# Patient Record
Sex: Female | Born: 1955 | Race: Black or African American | Hispanic: No | Smoking: Never smoker
Health system: Southern US, Community
[De-identification: ages and names within clinical notes are randomized; demographics above are authoritative.]

## PROBLEM LIST (undated history)

## (undated) DIAGNOSIS — K5792 Diverticulitis of intestine, part unspecified, without perforation or abscess without bleeding: Secondary | ICD-10-CM

## (undated) DIAGNOSIS — R51 Headache: Secondary | ICD-10-CM

## (undated) DIAGNOSIS — R42 Dizziness and giddiness: Secondary | ICD-10-CM

## (undated) DIAGNOSIS — B009 Herpesviral infection, unspecified: Secondary | ICD-10-CM

## (undated) DIAGNOSIS — F419 Anxiety disorder, unspecified: Secondary | ICD-10-CM

## (undated) DIAGNOSIS — R0600 Dyspnea, unspecified: Secondary | ICD-10-CM

## (undated) DIAGNOSIS — J189 Pneumonia, unspecified organism: Secondary | ICD-10-CM

## (undated) DIAGNOSIS — R519 Headache, unspecified: Secondary | ICD-10-CM

## (undated) DIAGNOSIS — I10 Essential (primary) hypertension: Secondary | ICD-10-CM

## (undated) DIAGNOSIS — G8929 Other chronic pain: Secondary | ICD-10-CM

## (undated) DIAGNOSIS — K219 Gastro-esophageal reflux disease without esophagitis: Secondary | ICD-10-CM

## (undated) DIAGNOSIS — J302 Other seasonal allergic rhinitis: Secondary | ICD-10-CM

## (undated) DIAGNOSIS — M25569 Pain in unspecified knee: Secondary | ICD-10-CM

## (undated) DIAGNOSIS — R7303 Prediabetes: Secondary | ICD-10-CM

## (undated) DIAGNOSIS — M1990 Unspecified osteoarthritis, unspecified site: Secondary | ICD-10-CM

## (undated) HISTORY — PX: ABDOMINAL HYSTERECTOMY: SHX81

## (undated) HISTORY — PX: ABLATION COLPOCLESIS: SHX1118

## (undated) HISTORY — PX: CHOLECYSTECTOMY: SHX55

---

## 2002-01-09 ENCOUNTER — Encounter: Payer: Self-pay | Admitting: Family Medicine

## 2002-01-09 ENCOUNTER — Ambulatory Visit (HOSPITAL_COMMUNITY): Admission: RE | Admit: 2002-01-09 | Discharge: 2002-01-09 | Payer: Self-pay | Admitting: Family Medicine

## 2002-09-04 ENCOUNTER — Emergency Department (HOSPITAL_COMMUNITY): Admission: EM | Admit: 2002-09-04 | Discharge: 2002-09-05 | Payer: Self-pay | Admitting: Emergency Medicine

## 2003-05-05 ENCOUNTER — Emergency Department (HOSPITAL_COMMUNITY): Admission: EM | Admit: 2003-05-05 | Discharge: 2003-05-05 | Payer: Self-pay | Admitting: Emergency Medicine

## 2004-07-02 ENCOUNTER — Inpatient Hospital Stay (HOSPITAL_COMMUNITY): Admission: EM | Admit: 2004-07-02 | Discharge: 2004-07-07 | Payer: Self-pay | Admitting: Emergency Medicine

## 2006-04-15 ENCOUNTER — Inpatient Hospital Stay (HOSPITAL_COMMUNITY): Admission: EM | Admit: 2006-04-15 | Discharge: 2006-04-19 | Payer: Self-pay | Admitting: Emergency Medicine

## 2006-04-18 ENCOUNTER — Ambulatory Visit: Payer: Self-pay | Admitting: Internal Medicine

## 2006-04-26 ENCOUNTER — Inpatient Hospital Stay (HOSPITAL_COMMUNITY): Admission: RE | Admit: 2006-04-26 | Discharge: 2006-04-30 | Payer: Self-pay | Admitting: Obstetrics and Gynecology

## 2006-06-28 ENCOUNTER — Ambulatory Visit (HOSPITAL_COMMUNITY): Admission: RE | Admit: 2006-06-28 | Discharge: 2006-06-28 | Payer: Self-pay | Admitting: Internal Medicine

## 2006-06-28 ENCOUNTER — Ambulatory Visit: Payer: Self-pay | Admitting: Internal Medicine

## 2007-01-20 ENCOUNTER — Emergency Department (HOSPITAL_COMMUNITY): Admission: EM | Admit: 2007-01-20 | Discharge: 2007-01-20 | Payer: Self-pay | Admitting: Emergency Medicine

## 2008-02-02 ENCOUNTER — Other Ambulatory Visit: Admission: RE | Admit: 2008-02-02 | Discharge: 2008-02-02 | Payer: Self-pay | Admitting: Obstetrics and Gynecology

## 2008-02-06 ENCOUNTER — Emergency Department (HOSPITAL_COMMUNITY): Admission: EM | Admit: 2008-02-06 | Discharge: 2008-02-06 | Payer: Self-pay | Admitting: Emergency Medicine

## 2008-04-05 ENCOUNTER — Ambulatory Visit (HOSPITAL_COMMUNITY): Admission: RE | Admit: 2008-04-05 | Discharge: 2008-04-05 | Payer: Self-pay | Admitting: Obstetrics and Gynecology

## 2008-04-12 ENCOUNTER — Ambulatory Visit (HOSPITAL_COMMUNITY): Admission: RE | Admit: 2008-04-12 | Discharge: 2008-04-12 | Payer: Self-pay | Admitting: Obstetrics and Gynecology

## 2008-04-12 ENCOUNTER — Encounter: Payer: Self-pay | Admitting: Obstetrics and Gynecology

## 2008-04-14 ENCOUNTER — Emergency Department (HOSPITAL_COMMUNITY): Admission: EM | Admit: 2008-04-14 | Discharge: 2008-04-15 | Payer: Self-pay | Admitting: Emergency Medicine

## 2008-07-25 ENCOUNTER — Encounter (HOSPITAL_COMMUNITY): Admission: RE | Admit: 2008-07-25 | Discharge: 2008-08-24 | Payer: Self-pay | Admitting: Neurology

## 2008-10-10 ENCOUNTER — Ambulatory Visit (HOSPITAL_COMMUNITY): Admission: RE | Admit: 2008-10-10 | Discharge: 2008-10-10 | Payer: Self-pay | Admitting: Family Medicine

## 2009-05-17 ENCOUNTER — Ambulatory Visit (HOSPITAL_COMMUNITY): Admission: RE | Admit: 2009-05-17 | Discharge: 2009-05-17 | Payer: Self-pay | Admitting: Family Medicine

## 2009-06-14 ENCOUNTER — Ambulatory Visit (HOSPITAL_COMMUNITY): Admission: RE | Admit: 2009-06-14 | Discharge: 2009-06-14 | Payer: Self-pay | Admitting: Family Medicine

## 2009-07-08 ENCOUNTER — Encounter (HOSPITAL_COMMUNITY): Admission: RE | Admit: 2009-07-08 | Discharge: 2009-08-07 | Payer: Self-pay

## 2010-08-13 ENCOUNTER — Ambulatory Visit (HOSPITAL_COMMUNITY): Admission: RE | Admit: 2010-08-13 | Discharge: 2010-08-13 | Payer: Self-pay | Admitting: Family Medicine

## 2010-08-14 ENCOUNTER — Observation Stay (HOSPITAL_COMMUNITY): Admission: EM | Admit: 2010-08-14 | Discharge: 2010-08-15 | Payer: Self-pay | Admitting: Emergency Medicine

## 2011-01-10 ENCOUNTER — Encounter: Payer: Self-pay | Admitting: Family Medicine

## 2011-01-11 ENCOUNTER — Encounter: Payer: Self-pay | Admitting: Family Medicine

## 2011-03-05 LAB — DIFFERENTIAL
Basophils Absolute: 0 10*3/uL (ref 0.0–0.1)
Basophils Relative: 1 % (ref 0–1)
Eosinophils Relative: 5 % (ref 0–5)
Monocytes Relative: 6 % (ref 3–12)
Neutrophils Relative %: 70 % (ref 43–77)

## 2011-03-05 LAB — COMPREHENSIVE METABOLIC PANEL
AST: 27 U/L (ref 0–37)
BUN: 10 mg/dL (ref 6–23)
CO2: 28 mEq/L (ref 19–32)
Calcium: 9.6 mg/dL (ref 8.4–10.5)
Chloride: 104 mEq/L (ref 96–112)
GFR calc non Af Amer: >60 mL/min (ref >60)
Glucose, Bld: 156 mg/dL — ABNORMAL HIGH (ref 70–99)
Sodium: 137 mEq/L (ref 135–145)
Total Bilirubin: 0.4 mg/dL (ref 0.3–1.2)
Total Protein: 6.8 g/dL (ref 6.0–8.3)

## 2011-03-05 LAB — CBC
Hemoglobin: 11.9 g/dL — ABNORMAL LOW (ref 12.0–15.0)
MCHC: 33 g/dL (ref 30.0–36.0)
Platelets: 276 10*3/uL (ref 150–400)
RBC: 4.35 MIL/uL (ref 3.87–5.11)
RDW: 15.1 % (ref 11.5–15.5)

## 2011-03-31 LAB — CREATININE, SERUM
Creatinine, Ser: 0.93 mg/dL (ref 0.4–1.2)
GFR calc non Af Amer: >60 mL/min (ref >60)

## 2011-04-20 ENCOUNTER — Other Ambulatory Visit (HOSPITAL_COMMUNITY): Payer: Self-pay | Admitting: Orthopaedic Surgery

## 2011-04-20 DIAGNOSIS — M7660 Achilles tendinitis, unspecified leg: Secondary | ICD-10-CM

## 2011-04-21 ENCOUNTER — Ambulatory Visit (HOSPITAL_COMMUNITY)
Admission: RE | Admit: 2011-04-21 | Discharge: 2011-04-21 | Disposition: A | Discharge status: Home / Self Care | Payer: PRIVATE HEALTH INSURANCE | Source: Ambulatory Visit | Attending: Orthopaedic Surgery | Admitting: Orthopaedic Surgery

## 2011-04-21 DIAGNOSIS — M25579 Pain in unspecified ankle and joints of unspecified foot: Secondary | ICD-10-CM | POA: Insufficient documentation

## 2011-04-21 DIAGNOSIS — M7660 Achilles tendinitis, unspecified leg: Secondary | ICD-10-CM | POA: Insufficient documentation

## 2011-04-23 ENCOUNTER — Other Ambulatory Visit (HOSPITAL_COMMUNITY): Payer: Self-pay

## 2011-05-05 NOTE — H&P (Signed)
[name], [name]               ACCOUNT NO.:  [id number]   MEDICAL RECORD NO.:  [id number]          PATIENT TYPE:  AMB   LOCATION:  DAY                           FACILITY:  [facility]   PHYSICIAN:  Tilda Burrow, M.D. DATE OF BIRTH:  12/09/1956   DATE OF ADMISSION:  DATE OF DISCHARGE:  LH                              HISTORY & PHYSICAL   ADMITTING DIAGNOSIS:  Perimenopausal bleeding.   HISTORY OF PRESENT ILLNESS:  This is a 56 year old female who was  admitted for hysteroscopy, D and C, and probable endometrial ablation  for evaluation and treatment of postmenopausal bleeding.  She has had an  endometrial biopsy performed earlier in this year for the same bleeding.  On March 01, 2008, she was found to have degenerating secretory-type  endometrium.  She was given Megace to try to control the bleeding.  She  has had no success at resolution of bleeding.  She was split through the  Megace and almost stayed since then.  Repeat ultrasound shows a  persistent endometrial thickening, raising the question of endometrial  polyp.  The endometrial cavity is irregular in contour and the lines  between endometrium and myometrium are not distinct.  The patient is  frustrated with the persistent bleeding and is highly nervous and  anxious about the continued need for monitoring.  We will, therefore,  proceed to attempt to resolve the bleeding by hysteroscopy and D and C  to confirm the presence of endometrial polyps, and if present they will  be removed.  If they are not polyps, a consideration would be given to  endometrial ablation in an effort to resolve the perimenopausal  bleeding.  The patient understands the procedure's risk including  bleeding, infection, or injury to adjacent organs.  The patient is a  dramatically anxious personality and is reluctant to proceed with  medical procedures, but she recognizes the need for resolution of this  problem.   PAST MEDICAL HISTORY:  Positive for  hypertension.   PAST SURGICAL HISTORY:  Negative.   HABITS:  Cigarettes, alcohol, or recreational drugs denied.   ALLERGIES:  Allergies consist of SULFA.   MEDICATIONS:  1. Hydrochlorothiazide 25 mg daily.  2. Maxzide 75/50.  3. Provera 10 mg daily, tried earlier this month.  4. Megace 40 mg daily, tried last month.  None have been successful.   PHYSICAL EXAMINATION:  GENERAL:  A highly anxious, expressive [place]  American female who seems to understand what was going on, but is by  nature very dramatic and anxious.  She understands the procedure.  She  understands additional options of continued observation or additional  medical therapies exists, and she prefers to proceed with a more  aggressive approach.  VITAL SIGNS:  Weight 204 and blood pressure 118/78.  HEENT:  Pupils are equal, round, and reactive.  Extraocular movements  within normal limits.  NECK:  Supple.  CHEST:  Clear to auscultation.  ABDOMEN:  Obese.  GENITALIA:  External genitalia normal.  Vaginal exam shows light blood  present.  Cervix is nonpurulent and nontender.  Uterus is anteflexed  with  ultrasound suggesting a 2.4-cm thickened endometrium.   IMPRESSION:  Persistent perimenopausal bleeding, suspected endometrial  polyp.   PLAN:  Hysteroscopy, D and C, and probable endometrial ablation on April 05, 2008.      Tilda Burrow, M.D.  Electronically Signed     JVF/MEDQ  D:  04/03/2008  T:  04/04/2008  Job:  [id number]

## 2011-05-05 NOTE — Op Note (Signed)
NAMEEPIPHANY, SELTZER               ACCOUNT NO.:  [id number]   MEDICAL RECORD NO.:  [id number]          PATIENT TYPE:  AMB   LOCATION:  DAY                           FACILITY:  [facility]   PHYSICIAN:  Tilda Burrow, M.D. DATE OF BIRTH:  1956-03-23   DATE OF PROCEDURE:  04/12/2008  DATE OF DISCHARGE:                               OPERATIVE REPORT   </PREOPERATIVE DIAGNOSIS/ : Postmenopausal Bleeding, suspected  endometrial polyp   POSTOPERATIVE DIAGNOSIS:  Postmenopausal Bleedingn, suspected  endometrial polyp, .   PROCEDURES:  Hysteroscopy, D&C, endometrial ablation with removal of a  large endometrial polyp.   SURGEON:  Tilda Burrow, M.D.   ASSISTANT:  None.   ANESTHESIA:  General.   COMPLICATIONS:  None.   FINDINGS:  Large 4-cm x 2-cm elongated polypoid endometrial tissue  extracted, small amounts of shaggy endometrium and a small stock of the  endometrial polyp, treated with endometrial ablation.  Surgeon,  Emelda Fear, has passed the specimen to the lab.   DETAILS OF PROCEDURE:  The patient was taken to the operating room and  prepped and draped for a vaginal procedure with timeout conducted per  protocol.  Cervix was grasped with a single-tooth tenaculum after  placement of a Graves speculum.  Cervix was sounded to 9 cm, dilated to  25 [place] allowing introduction of a 30-degree rigid hysteroscope with  visualization and photo documentation of the large endometrial polyp.  A  stock could be identified on the anterior fundal portion of the uterine  contour, and the laparoscopic scissors were used to transect the  majority of the polypoid stock.  The upper aspects of the stock could  not be easily accessed.  The hysteroscope was removed.  Randall stone  forceps were then used to grasp the polyp and rotate it, and then  extracted in fragments of the large polypoid structure.  Hysteroscopy  was performed, identified that there was some additional tissue, which  could then  be grasped with the hysteroscopic grasping device and then  extracted with a hysteroscope.  Again, the hysteroscopy confirmed that  the uterine cavity was satisfactorily emptied with a little bit of the  stock still present.  Smooth and sharp curettage was performed, and then  we proceeded to endometrial ablation to further destroy the polyp stock.  Standard Gynecare ThermaChoice III ablation technique was used, using a  total of 17 mL of D5W for the inflation of the endometrial cavity, and  all 17 mL were recollected at the completion of the procedure.  Hysteroscopy confirmed satisfactory thermal changes in the endometrial  cavity.  Paracervical block with Marcaine 15 mL of 0.5% solution with  1:200000 epinephrine was injected into the paracervical area, and the  patient allowed to awake and go to the recovery room in stable  condition.      Tilda Burrow, M.D.  Electronically Signed     JVF/MEDQ  D:  04/12/2008  T:  04/13/2008  Job:  [id number]   cc:   Mila Homer. Sudie Bailey, M.D.  Fax: [phone]

## 2011-05-05 NOTE — H&P (Signed)
[name], DELAUDER               ACCOUNT NO.:  [id number]   MEDICAL RECORD NO.:  [id number]          PATIENT TYPE:  AMB   LOCATION:  DAY                           FACILITY:  [facility]   PHYSICIAN:  Tilda Burrow, M.D. DATE OF BIRTH:  01/22/1956   DATE OF ADMISSION:  DATE OF DISCHARGE:  LH                              HISTORY & PHYSICAL   ADMITTING DIAGNOSES:  1. Postmenopausal bleeding.  2. Status post vertigo with negative workup February 2008.   HISTORY OF PRESENT ILLNESS:  This 55 year old female patient of Dr.  Gareth Morgan has been seen in our office periodically since February  2009, when she was referred to our office for complaints of  postmenopausal bleeding with anemia and mildly elevated blood pressures.  She had an endometrial biopsy performed through our office, which is  showing degenerating secretory-type endometrium even though she has no  recorded history of progesterone therapy.  Ultrasound prior to biopsy  showed thickened endometrial tissue, 3.6 x 2.2 cm in diameter.  This  raised a question of endometrial polyp.  The patient since then has  continued to bleed intermittently, mild in nature, and at this point is  being taken to the operating room for hysteroscopy and D&C to try to  eliminate the bleeding by removal of endometrial polyp.  The patient was  offered the same procedure in March but declined it.  She was attempted  on Megace 40 mg b.i.d., which did not control the spotting.   Medical history is notable for episodes of dizziness and unsteady gait,  noted by Dr. Sudie Bailey.  The dizziness was sufficient enough that earlier  this year in January the patient was stopped by police on the street due  to her being suspected for intoxication based on her unsteady gait.  She  has no history of seizures and no known workup for CNS lesions.  A CT  scan of the head was performed, which was  negative.  Fortunately the  symptoms of dizziness have seemingly  resolved.   LABORATORY EVALUATION:  Laboratory evaluation today includes thyroid  function tests normal.  TSH 1.91.  Hemoglobin 11.5, hematocrit 36.9,  serum iron 53, B12 level 571.  Folic acid greater than 20 mg/mL.  Electrolytes were within normal limits with potassium 3.9, BUN 14,  creatinine 1.07.   PHYSICAL EXAMINATION:  GENERAL:  Exam shows a cheerful anxiety-prone  African American female.  VITAL SIGNS:  Weight 204, blood pressure 118/78.  Height estimated at 5  feet, 5 inches.  HEENT:  Pupils equal, round, and reactive.  CARDIOVASCULAR:  Unremarkable.  ABDOMEN:  Obesity present without masses.  EXTERNAL GENITALIA:  Normal for age.  Vaginal exam shows light blood  present.  The uterus is anteflexed, seems within normal limit size with  ultrasound identification of a thickened endometrium earlier this year.  Adnexa without masses or tenderness.   IMPRESSION:  Postmenopausal bleeding, suspected endometrial polyp.   PLAN:  Hysteroscopy and D&C April 05, 2008.   The procedure has been reviewed with the patient.  Polypectomy is  planned if discovered.  In the event  that there is a diffuse thickening  the tissue D&C will be performed and followed with endometrial ablation.  The patient understands the procedure.  The options and the plan of  action with the usual risks of surgery, including bleeding, injury to  adjacent organs, uterine perforation, etc., were reviewed with the  patient.      Tilda Burrow, M.D.  Electronically Signed     JVF/MEDQ  D:  04/04/2008  T:  04/04/2008  Job:  [id number]   cc:   Mila Homer. Sudie Bailey, M.D.  Fax: [phone]

## 2011-05-08 NOTE — Consult Note (Signed)
[name], [name]               ACCOUNT NO.:  [id number]   MEDICAL RECORD NO.:  [id number]          PATIENT TYPE:  INP   LOCATION:  A303                          FACILITY:  [facility]   PHYSICIAN:  Lionel December, M.D.    DATE OF BIRTH:  1956-12-19   DATE OF CONSULTATION:  04/26/2006  DATE OF DISCHARGE:                                   CONSULTATION   REASON FOR CONSULTATION:  Lower abdominal pain in a patient with history of  diverticulitis.   HISTORY OF PRESENT ILLNESS:  [name] is a 55 year old African-American  female who was treated at this facility between April 15, 2006, and April 19, 2006.  She presented with lower abdominal pain.  Abdominopelvic CT  suggested diverticulitis.  She was treated with IV Flagyl and Cipro.  She  was seen in consultation by Dr Jena Gauss.  She improved with therapy.  The plan  was for her to come back to [place] at the end of this month for a colonoscopy.  The patient states that she was feeling a lot better when she home when she  went home, although she was not pain free.  She states she has been  compliant with her Cipro and Flagyl.  Last evening she noted sharp pain  across the lower abdomen.  She did not experience fever but had chills.  She  had an appointment with Dr. Sudie Bailey this morning.  She therefore decided to  wait.  However, she woke up around 2:30 a.m. this morning with throbbing  pain which she has had ever since.  She states most of the pain is in the  left lower quadrant although pain is experienced across the lower abdomen.  She denies nausea or vomiting.  She has had intermittent diarrhea although  she did not have a BM yesterday.  This morning she had mushy stool with  fresh blood in it.  It was a small amount.  She does not have good appetite.  However, she states all of her weight loss is voluntary.  She states she has  lost 18 pounds this year.  The patient states she has had more or less  constant pain for the last 2 weeks.  However,  she has had off and on pain  for 2, maybe 3 months.  She was seen by Dr. Sudie Bailey.  She had lab studies  and abdominopelvic CT and hospitalized for further management.   At home she is on:  1.  Hydrochlorothiazide 25 mg daily.  2.  Potassium chloride 120 mEq daily.  3.  Albuterol inhaler two puffs q.i.d. p.r.n.  4.  Cipro 500 mg b.i.d.  5.  Flagyl 5 mg p.o. t.i.d..   PAST MEDICAL HISTORY:  She has been hypertensive since she was 55 years old.  Asthma was diagnosed in 1996 and she has not had any problems.  She had left  pyelonephritis with E. coli urosepsis for which she was hospitalized in July  2005.  She had tubal ligation about 23 years ago and open cholecystectomy  about 20 or 21 years ago.   Recent hospitalization  as above.   ALLERGIES:  To DILAUDID which caused itching and possibly angioedema.  She  was also allergic to SULFA which caused rash and blisters.   FAMILY HISTORY:  Noncontributory.  She had eight brothers and three sisters.  Three brothers are diseased.  Two died at young age and one at age 15 years.  One sister died at young age as well.  Rest of the siblings are in good  health.   SOCIAL HISTORY:  She is married.  She has two children.  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.  She does not smoke  cigarettes or drink alcohol.   PHYSICAL EXAMINATION:  GENERAL:  Pleasant, moderately-obese African-American  female who is in some distress.  VITAL SIGNS:  Admission weight 201 pounds.  She is 5 feet tall.  Pulse 95  per minute, blood pressure 130/76, temperature is 98.4, and respirations 16.  HEENT:  Conjunctivae are pink.  Sclerae are nonicteric.  Oropharyngeal  mucosa is normal.  No neck masses or thyromegaly noted.  CARDIAC:  With  regular rhythm., normal S1 and S2.  No murmur or gallop noted.  LUNGS:  Clear to auscultation.  ABDOMEN:  Full, bowel sounds are  normal.  Palpation reveals soft abdomen  with moderate tenderness at LLQ in hypogastric area.  Some guarding noted in  these areas but no rebound.  She has mild tenderness at LUQ and right lower  quadrant.  No organomegaly or masses noted.  RECTAL:  Examination deferred.  EXTREMITIES:  She does not have peripheral edema or clubbing.   LABORATORY:  These were done prior to admission by [facility]:  Wbc's 13.3;  H&H is 12.5 and 37.7; platelet count is 403,000.  She had 75 segs, 0 bands.  Sed rate is 28.  Sodium 137, potassium 4.1, chloride 103, CO2 is 20, glucose  110, BUN 12, creatinine 1.3, bili is 0.5, alkaline phosphatase 107, AST 23,  ALT 21, total protein 7.2, with albumin of 4.1, calcium is 10.1.   Abdominopelvic CT reviewed with Dr. Tyron Russell in comparison made with the study  of April 15, 2006.  She has thickening to sigmoid colon mucosa with  diverticula and pericolonic stranding but there is no free air or fluid  collection.  There is a small localized area right pelvic cavity which is  possibly an ovary rather than a small amount of fluid collection.  This is  unchanged since previous study.   ASSESSMENT:  [name] is a 55 year old African-American female who presents  with worsening lower abdominal pain mainly centered in hypogastric and left  lower quadrant area.  She was recently hospitalized for what was felt to be  diverticulitis and improved with antibiotic therapy.  Her symptoms have  progressed while on antibiotic therapy, which is worrisome.  She also  experienced hematochezia this morning.  I still believe we are dealing with  diverticulitis with poor response to therapy.  However, she could have  sigmoid colon neoplasm present like this or sigmoid colon neoplasm which can  present like this, or she could also C. difficile colitis even though she is  on metronidazole.  Patients with C. difficile can fail or not respond to metronidazole.  It is possible that hematochezia is  secondary to hemorrhoids  or even diverticulitis.  Since there is no evidence of free air or abscess,  I feel IV antibiotic therapy  is warranted for the next couple of days.  If  she does not improve significantly in the next 48 hours, surgical  consultation would be warranted.  If she shows improvement, we will hold off  doing a colonoscopy and/or sigmoidoscopy.  She would definitely need a  follow-up CT prior to discharge to document that the process has not been  progressive.  Furthermore, she will need antibiotic therapy for at least 4  weeks.  If her acute illness responds to medical or conservative therapy,  she will still need to come back for a colonoscopy at a later date.   RECOMMENDATIONS:  Agree with treating her with Levaquin or IV metronidazole  but I have taken the liberty of changing the dose of metronidazole to 500 mg  IV q.6h.  She will be given morphine IV for pain control.  She will have  stool for wbc's and C. difficile toxin titer.   She will have CBC in a.m.  We will allow her clear liquids for the time-  being.   We would like to thank Dr. Sudie Bailey for the opportunity to participate in  the care of this nice lady.      Lionel December, M.D.  Electronically Signed     NR/MEDQ  D:  04/26/2006  T:  04/27/2006  Job:  [id number]

## 2011-05-08 NOTE — H&P (Signed)
NAMEAMAIYA, [name]               ACCOUNT NO.:  [id number]   MEDICAL RECORD NO.:  [id number]          PATIENT TYPE:  INP   LOCATION:  A339                          FACILITY:  [facility]   PHYSICIAN:  Angus G. Renard Matter, MD   DATE OF BIRTH:  1956/11/05   DATE OF ADMISSION:  04/15/2006  DATE OF DISCHARGE:  LH                                HISTORY & PHYSICAL   This 55 year old female was admitted through the ED.  She presented there  with   DICTATION CANCELLED      Angus G. Renard Matter, MD  Electronically Signed     AGM/MEDQ  D:  04/15/2006  T:  04/15/2006  Job:  [id number]

## 2011-05-08 NOTE — H&P (Signed)
NAMEAKILAH, CURETON               ACCOUNT NO.:  [id number]   MEDICAL RECORD NO.:  [id number]          PATIENT TYPE:  INP   LOCATION:  A339                          FACILITY:  [facility]   PHYSICIAN:  Mila Homer. Sudie Bailey, M.D.DATE OF BIRTH:  1956/05/27   DATE OF ADMISSION:  04/15/2006  DATE OF DISCHARGE:  LH                                HISTORY & PHYSICAL   55 year old developed left lower quadrant abdominal pain last week.  It  persisted and yesterday she had nausea and vomiting with this, no diarrhea.  Bowel movements were normal.  No urinary symptoms.   She came up to the [facility] Department for evaluation due to the  pain.   She has been on HCTZ 25 mg per day for hypertension.   REVIEW OF SYSTEMS:  Other than this was unremarkable.  Occasional fever and  chills.   PHYSICAL EXAMINATION:  GENERAL:  Pleasant middle-aged woman who is now in a  hospital bed.  She is oriented and alert, no acute distress.  Somewhat  obese.  VITAL SIGNS:  Blood pressure initially 120/75, pulse 97, respiratory rate  20, temperature 98.7.  LYMPH:  She had no axillary, supraclavicular, anterior cervical nodes.  HEENT:  Mucous membranes were moist.  Speech was normal.  HEART:  Regular rhythm, rate about 70.  LUNGS:  Clear throughout, moving air well.  ABDOMEN:  Soft without hepatosplenomegaly or mass but there was definite  tenderness in the left lower quadrant on palpation.  There was no rebound  noted.  Bowel sounds appeared to be normal.  EXTREMITIES:  There was no edema of the ankles.   CT scan of the abdomen did show a sigmoid diverticulitis with some  inflammatory nodes.  There was atherosclerosis of the aorta.   Blood tests included admission CBC:  White cell count 10,300 of which 72%  neutrophils, 17 lymphs.  A CMP showed a glucose 113, potassium 3.3, lipase  38.  The UA showed a specific gravity 1015, pH 7 with moderate leukocytes.  Under the microscope there were many squamous  cells, many bacteria, 11-20  wbc's per HPF.   ADMITTING DIAGNOSES:  1.  Sigmoid diverticulitis.  2.  Essential hypertension.  3.  Hypokalemia.  4.  Obesity.  5.  Atherosclerosis of the aorta.  6.  Hyperglycemia.   She is now admitted to the hospital on Cipro 400 mg IV q.12h. and Flagyl 500  mg IV t.i.d.  She has been given Dilaudid for pain, but had some itching  with this so this is being stopped.  She is on an IV of half normal saline.  discussed with patient and family.      Mila Homer. Sudie Bailey, M.D.  Electronically Signed     SDK/MEDQ  D:  04/15/2006  T:  04/15/2006  Job:  [id number]

## 2011-05-08 NOTE — H&P (Signed)
[name], [name]                         ACCOUNT NO.:  [id number]   MEDICAL RECORD NO.:  [id number]                   PATIENT TYPE:  INP   LOCATION:  A327                                 FACILITY:  [facility]   PHYSICIAN:  Hanley Hays. Dechurch, M.D.           DATE OF BIRTH:  07/26/1956   DATE OF ADMISSION:  07/02/2004  DATE OF DISCHARGE:                                HISTORY & PHYSICAL   HISTORY OF PRESENT ILLNESS:  This 55 year old African American female  followed by Dr. Sudie Bailey with a history of hypertension, usually well  controlled, presented to the emergency room with a two day history of  headache, chills, myalgias, fevers and right flank pain.  Her workup in the  emergency room included various labs, CT of head and abdomen and a lumbar  puncture.  Significant findings included changes on the CT scan consistent  with a right pyelonephritis, though, no evidence of abscess of obstruction.  She had a leukocytosis and a fever of 103.  Initial blood pressure was  115/68 which fell to a low of 85/52.  She was also tachycardic at the time  of presentation.  She states she has had headaches for years for which she  takes [place] powders or BC powders on a regular basis.  She has never had any  previous evaluation, nor has she had any mental status changes, focal  findings or changes in her headaches.  She did note some nervousness which  actually she described as shaking all over which sounds more like rigors.  She is not anything for anxiety.  She does not use any street drugs.  She  has had no change in her stools.  No nausea, vomiting or diarrhea, though  she had some nausea this morning.  She has had no hematuria, hematochezia.  She has had no chest pain, though, she does note some right upper quadrant  pain and some pleuritic-type symptoms.  She notes some pain with cough.  She  states she is short of breath now and has noted it over the last 24 hours.  She has no history of blood  clots.  The patient is admitted to the hospital  for further evaluation and treatment.   PAST MEDICAL HISTORY:  1. Hypertension.  2. No surgeries.  3. Gravida 1, para 1, abortus 0.  4. Uncomplicated pregnancy.  5. Status post cholecystectomy.   SOCIAL HISTORY:  She is married, monogamous.  No alcohol or tobacco abuse.  She works for [employer] and [employer] and has for several years.   FAMILY HISTORY:  She has multiple brothers and sisters with no significant  family history.  Mother died related to heart failure.  Father is still  living.   REVIEW OF SYSTEMS:  As per HPI.   PHYSICAL EXAMINATION:  VITAL SIGNS:  Lightly obese female.  Blood pressure  currently 99.49, pulse 120, temperature 101.2.  GENERAL APPEARANCE:  She  is alert and appropriate.  Able to provide adequate  history.  She has no focal neurological findings.  HEENT:  No icterus.  Oropharynx is moist.  Teeth are fair repair.  NECK:  Supple.  No JVD, adenopathy, thyromegaly or bruits.  LUNGS:  Clear to auscultation though diminished at the bases.  No rales.  HEART:  Regular, slightly tachycardic.  No murmur.  ABDOMEN:  Protuberant, soft, diffusely tender in the right upper quadrant.  EXTREMITIES:  Without cyanosis, clubbing or edema.  SKIN:  Without rash, lesion or breakdown.  NEUROLOGICAL:  Completely intact.   LABORATORY DATA:  D. dimer 2.2.  Urinalysis reveals white cells and  incidental findings of trichomoniasis.  Bilirubin initially 2.2, now 1.8.  Albumin 2.5, MCV 74, hemoglobin 9.9 post hydration, potassium now 3.2 from  2.6.  At time of admission, BUN 13, creatinine 1.4.   ASSESSMENT/PLAN:  1. Febrile illness, most likely on the basis of right pyelonephritis.  2. Elevated D. dimer, though her pain may be related to the pyelonephritis,     cannot rule out pleuritis.  Certainly, given her history, it would be     reasonable to follow up CT to rule out PE with continued hydration in     order not to  assault the kidney any more with dye.  3. Incidental findings of trichomoniasis.  No previous history.  Will go     ahead and treat with Flagyl.  Her partner will need 2 g single dose     therapy at discharge.  4. Anemia with microcytosis, probably on the basis of chronic iron     deficiency in this patient with ongoing menses.  Will  heme check stools.     Further workup as an outpatient.  Will do the hematologic evaluation     here.  5. Increase bilirubin with normal LFT's.  No previous history with normal     appearing liver without obstruction.  Suspect __________.  Will monitor.  6. Hypertension, stable.  Hold the hydrochlorothiazide until sepsis is     improved and will need K supplement as an outpatient.  7. Decreased potassium.  Continue IV p.o. repletion.     ___________________________________________                                         Hanley Hays Josefine Class, M.D.   FED/MEDQ  D:  07/03/2004  T:  07/03/2004  Job:  [id number]

## 2011-05-08 NOTE — H&P (Signed)
NAMESHAKIMA, NISLEY               ACCOUNT NO.:  [id number]   MEDICAL RECORD NO.:  [id number]          PATIENT TYPE:  INP   LOCATION:  A303                          FACILITY:  [facility]   PHYSICIAN:  Mila Homer. Sudie Bailey, M.D.DATE OF BIRTH:  1956-11-13   DATE OF ADMISSION:  04/26/2006  DATE OF DISCHARGE:  LH                                HISTORY & PHYSICAL   HISTORY OF PRESENT ILLNESS:  This 55 year old woman came to the office today  with increasing abdominal pain and chills and as of 2 o'clock this morning,  some bloody diarrhea.   She recently had been admitted to the hospital with a presumptive diagnosis  of diverticulitis and treated with Cipro 400 mg b.i.d. and Metronidazole 500  mg t.i.d., both IV.  She had an allergic reaction to Dilaudid which included  some angioedema of the neck and throat but she recovered from this with one  dose of IV steroids and she was discharged home about a week ago on Cipro  500 mg b.i.d. and Metronidazole 500 mg t.i.d.  Other diagnoses of benign  essential hypertension and obesity.   PHYSICAL EXAMINATION:  VITAL SIGNS:  In the office today she weighed 203  pounds, height 60 inches, the volume mass index was 40, the blood pressure  140/86, the pulse 104.  GENERAL:  She was oriented, alert, and distressed over the abdominal pain.  SKIN:  Skin turgor looked good.  HEENT:  Mucous membranes were moist.  She gave a good history.  HEART:  Had a regular rhythm and rate of 120.  LUNGS:  Clear throughout.  Moving air well.  ABDOMEN:  Soft, but she has severe tenderness with rebound in the left upper  quadrant and severe abdominal pain and palpation, also with rebound in the  right lower quadrant.  Bowel sounds were present but quiet.  EXTREMITIES:  There is no edema of the ankles.   LABORATORY DATA:  Her white cell count was 13,300, with 75% granulocytes, 16  lymphs.  Her H&H was 12.5/37.7, platelet count of 403,000.  Sedimentation  rate was 28 (normal  0-22) and a met 77, glucose of 110, BUN 12, creatinine  1.3.   Her abdominal CT scan with contrast still showed what appeared to be  diverticulitis involving the sigmoid region.  This really had not changed  markedly from her prior abdominal CT scan.   ADMISSION DIAGNOSES:  1.  Diverticulitis.  2.  Possible Clostridium difficile colitis.  3.  Hyperglycemia.  4.  Obesity.  5.  Benign essential hypertension.   I discussed her case with Dr. Lionel December, gastroenterologist.  Stool for  routine cultures and antigen for C. difficile was pending.  Meanwhile, I am  putting her back on Metronidazole 500 mg IV q.8 h., but switching her to  Levaquin 500 mg IV q.24 h. instead of the Cipro.  We will see how she does  in the next couple days and get surgery referral if necessary.      Mila Homer. Sudie Bailey, M.D.  Electronically Signed     SDK/MEDQ  D:  04/26/2006  T:  04/26/2006  Job:  [id number]

## 2011-05-08 NOTE — Discharge Summary (Signed)
[name], [name]               ACCOUNT NO.:  [id number]   MEDICAL RECORD NO.:  [id number]          PATIENT TYPE:  INP   LOCATION:  A303                          FACILITY:  [facility]   PHYSICIAN:  Mila Homer. Sudie Bailey, M.D.DATE OF BIRTH:  1956-08-03   DATE OF ADMISSION:  04/26/2006  DATE OF DISCHARGE:  05/11/2007LH                                 DISCHARGE SUMMARY   HISTORY OF PRESENT ILLNESS:  This 55 year old woman was readmitted to the  hospital with persistent diverticulitis.  She had been on Cipro 500 mg  b.i.d. and metronidazole 500 mg t.i.d. at home.  She had a benign 5-day  hospital course extending from Apr 26, 2006, to Apr 30, 2006, and her vital  signs remained stable.  Her admission white cell count from the office was  13,000 with a shift to the left.  Sedimentation rate was 38.  Recheck white  cell count on second day in hospital was 8700 with 17% neutrophils and 16  lymphs.  UA was negative.  Fecal white cells and C. difficile were negative.   HOSPITAL COURSE:  She was readmitted to the hospital and started on Levaquin  500 mg IV q.24h. and metronidazole 500 mg IV q.8h.  Dr. Karilyn Cota and Dr.  Jena Gauss, gastroenterology, were called in on consult.  Her metronidazole was  increased to 5 mg IV q.6h. and she was given morphine sulfate 4 mg IV q.6h.  for severe pain.  Ambien 10 mg nightly for sleep.  Unfortunately, she had  swelling of the neck again and this was felt this was probably secondary to  morphine, so she was taken off the morphine and switched to Vicodin 5/500  every 4 hours and given a couple of doses of Solu-Medrol 40 mg at 12-hour  interval.   On hospital day #4, she was switched to Flagyl 500 mg t.i.d. and Avelox 400  mg daily.  IV Flagyl and Levaquin were discontinued.   On hospital day #5, she still had some minimal abdominal pain.  She was up  to a low-residue diet.  It was felt that she was stable for discharge home.   DISCHARGE MEDICATIONS:  1.  Avelox 400 mg  daily (15 with no refills).  2.  Metronidazole 500 mg t.i.d. (45 with no refills).   FOLLOW UP:  She is to see me in the office within the week after discharge  and will have a follow up with Dr. Karilyn Cota and Dr. Jena Gauss including  colonoscopy once her infection cooled down within the next 6-8 weeks.   DISCHARGE DIAGNOSES:  1.  Probable diverticulitis.  2.  Angioedema secondary to intravenous narcotics.  3.  Benign essential hypertension.  4.  Hyperglycemia.  5.  Obesity.      Mila Homer. Sudie Bailey, M.D.  Electronically Signed     SDK/MEDQ  D:  04/30/2006  T:  04/30/2006  Job:  [id number]

## 2011-05-08 NOTE — Group Therapy Note (Signed)
[name], [name]               ACCOUNT NO.:  [id number]   MEDICAL RECORD NO.:  [id number]          PATIENT TYPE:  INP   LOCATION:  A339                          FACILITY:  [facility]   PHYSICIAN:  Mila Homer. Sudie Bailey, M.D.DATE OF BIRTH:  Jan 23, 1956   DATE OF PROCEDURE:  04/17/2006  DATE OF DISCHARGE:                                   PROGRESS NOTE   SUBJECTIVE:  She is still in pain on the left side. The neck feels better.   OBJECTIVE:  Temperature is 97.3, pulse 73, respiratory rate 20, blood  pressure 93/60. She is sitting in a chair, looking out the window.  She  appears to be oriented, alert. She is well developed and obese. She is in no  acute distress. Heart has a regular rhythm with a rate of 70. Lungs are  clear throughout. Abdomen is soft and obese without hepatosplenomegaly or  masses, but she does have some tenderness to deep palpation in the left  lower quadrant. She still has some swelling noted in the anterior neck  bilaterally, but definitely better than it was yesterday. Today, the white  cell count is 11,600 with 91 neutrophils and 6 lymphocytes. MET-7 shows a  glucose of 171.   ASSESSMENT:  1.  Diverticulitis of the sigmoid colon.  2.  Allergic reaction with some angioedema of the neck.  3.  Hyperglycemia.   PLAN:  Discussed with Dr. Jena Gauss of GI. Will continue with IV fluids and  antibiotics. Will try to hold with one dose of Solu-Medrol since we do not  want to compromise the immune system with the diverticulitis.      Mila Homer. Sudie Bailey, M.D.  Electronically Signed     SDK/MEDQ  D:  04/17/2006  T:  04/18/2006  Job:  [id number]

## 2011-05-08 NOTE — Group Therapy Note (Signed)
[name], [name]               ACCOUNT NO.:  [id number]   MEDICAL RECORD NO.:  [id number]          PATIENT TYPE:  INP   LOCATION:  A303                          FACILITY:  [facility]   PHYSICIAN:  Mila Homer. Sudie Bailey, M.D.DATE OF BIRTH:  1956/01/06   DATE OF PROCEDURE:  04/27/2006  DATE OF DISCHARGE:                                   PROGRESS NOTE   SUBJECTIVE:  The patient is a generally feeling better today than she did  yesterday.  It is noted she had neck thickening again.   OBJECTIVE:  VITAL SIGNS:  Temperature is 98.8, pulse 92, respiratory rate 16  and the blood pressure is 110/71.  GENERAL APPEARANCE:  The patient is supine in bed.  She is oriented, alert  and in no acute distress.  The patient is well-developed and obese.  She  weighs 202 pounds and she is 60 inches in height.  HEART:  The heart has a regular rhythm with a rate of 70 on my exam.  LUNGS:  The lungs clear and moving air well.  ABDOMEN:  The abdomen is soft without hepatosplenomegaly or mass.  There is  minimal tenderness; some in the left upper quadrant and some in the right  lower quadrant.  It definitely improved from yesterday.  EXTREMITIES:  There is edema the ankles.  NECK: I can feel two areas of thickening in the anterior neck.   ASSESSMENT:  Diverticulitis.   PLAN:  1.  Continue with the Levaquin 500 mg IV daily.  2.  Metronidazole 500 mg IV q.8 hours.   I discussed the patient's case at length with Dr. Dionicia Abler, gastroenterologist,  yesterday.      Mila Homer. Sudie Bailey, M.D.  Electronically Signed     SDK/MEDQ  D:  04/27/2006  T:  04/28/2006  Job:  [id number]

## 2011-05-08 NOTE — Op Note (Signed)
NAMEREIZEL, [name]               ACCOUNT NO.:  [id number]   MEDICAL RECORD NO.:  [id number]          PATIENT TYPE:  AMB   LOCATION:  DAY                           FACILITY:  [facility]   PHYSICIAN:  Lionel December, M.D.    DATE OF BIRTH:  January 20, 1956   DATE OF PROCEDURE:  06/28/2006  DATE OF DISCHARGE:                                 OPERATIVE REPORT   PROCEDURE:  Colonoscopy.   INDICATIONS:  Delecia is 55 year old Afro-American female who was  hospitalized twice for diverticulitis.  She is finally responded to  antibiotic therapy.  She is undergoing colonoscopy both for diagnostic and  screening purposes.  Presently she is free of abdominal pain.  Procedure and  risk were reviewed with the patient, informed consent was obtained.   MEDS FOR CONSCIOUS SEDATION:  Demerol 50 mg IV, Versed 4 mg IV.   FINDINGS:  Procedure performed in endoscopy suite.  The patient's vital  signs and O2 sat were monitored during procedure and  remained stable.  The  patient was placed in left lateral position.  Rectal examination performed.  No abnormality noted on external or digital exam.  Olympus videoscope was  placed rectum and advanced under vision into sigmoid colon beyond.  Preparation was excellent.  She had scattered diverticula at sigmoid colon.  These were small to moderate in size.  No peristomal inflammation was noted.  These are limited mainly to the distal sigmoid colon and there was no  peristomal erythema or mucopurulent discharge involving any of these.  Scope  was passed into descending colon and easily into cecum.  Cecum was  identified by appendiceal orifice and ileocecal valve.  Pictures taken for  the record.  As the scope was withdrawn colonic mucosa was examined for the  second time.  There was a 6 to 7 mm submucosal lipoma at transverse colon  which was left alone.  Mucosa rest of the colon was normal.  Rectal mucosa  similarly was normal.  Scope was retroflexed to examine anorectal  junction  and small hemorrhoids noted below the dentate line.  Endoscope was  straightened withdrawn.  The patient tolerated the procedure well.   Sigmoid colon diverticulosis (moderate).  Incidental finding of small  submucosal lipoma and the transverse colon.  Small external hemorrhoids.   RECOMMENDATIONS:  High-fiber diet plus fiber supplement 4 grams daily.   Yearly Hemoccults and she may consider next exam in 10 years from now.      Lionel December, M.D.  Electronically Signed     NR/MEDQ  D:  06/28/2006  T:  06/28/2006  Job:  [id number]   cc:   Mila Homer. Sudie Bailey, M.D.  Fax: [phone]

## 2011-05-08 NOTE — Group Therapy Note (Signed)
NAMESHERLEEN, PANGBORN               ACCOUNT NO.:  [id number]   MEDICAL RECORD NO.:  [id number]          PATIENT TYPE:  INP   LOCATION:  A339                          FACILITY:  [facility]   PHYSICIAN:  Mila Homer. Sudie Bailey, M.D.DATE OF BIRTH:  09-07-1956   DATE OF PROCEDURE:  04/16/2006  DATE OF DISCHARGE:                                   PROGRESS NOTE   SUBJECTIVE:  The patient developed some tenderness in the anterior neck last  night and persisted today. Yesterday, she had had some itching after getting  Dilaudid and we switched her IV morphine at that point. She is still having  some abdominal tenderness as well.   OBJECTIVE:  Temperature 99.2, pulse 80, respiratory rate 20, blood pressure  97/55. Her lungs are clear throughout, moving air well. Heart has a regular  rate and rhythm, rate of about 70 today. The abdomen is soft and obese  without demonstrable hepatosplenomegaly or masses. She does have some  diffuse tenderness more in the left lower quadrant than in the right lower  quadrant, but there is as well. There is no edema of the ankles. The  anterior neck shows swelling and tenderness bilaterally, seems to be diffuse  and does not involve lymph nodes as such.   Today, her white cell count is 8200 with 68% neutrophils, 18 lymphs, H&H  11.7/34.9.  Potassium 3.3, glucose 103, creatinine 103. Her O2 saturation is  98% on room air.   MR of the neck showed a diffuse edema involving the submandibular region,  strap muscles in the lingual region.   ASSESSMENT:  1.  Diverticulitis.  2.  Probable allergic reaction to drug (early angioedema).  3.  Hypokalemia.  4.  Hyperglycemia.   PLAN:  Solu-Medrol 125 mg IV one time dose. Continue with IV Cipro 400 mg  q.12h., Flagyl 5 mg IV t.i.d. Recheck on exam in the morning. Of note, I  personally talked to radiologist about these findings on the phone.      Mila Homer. Sudie Bailey, M.D.  Electronically Signed     SDK/MEDQ  D:   04/16/2006  T:  04/17/2006  Job:  [id number]

## 2011-05-08 NOTE — Discharge Summary (Signed)
[name], [name]                         ACCOUNT NO.:  [id number]   MEDICAL RECORD NO.:  [id number]                   PATIENT TYPE:  INP   LOCATION:  A327                                 FACILITY:  [facility]   PHYSICIAN:  Hanley Hays. Dechurch, M.D.           DATE OF BIRTH:  07-May-1956   DATE OF ADMISSION:  07/02/2004  DATE OF DISCHARGE:  07/07/2004                                 DISCHARGE SUMMARY   DIAGNOSES:  1. E. coli sepsis secondary to pyelonephritis.  2. Asymptomatic trichomoniasis, incidental finding.  3. History of asthma with exacerbation secondary to volume overload.  4. Hypokalemia, resolved.  5. Hypertension.  6. Anemia with iron deficiency.  7. History of chronic headaches.   DISPOSITION:  The patient is discharged to home.   MEDICATIONS:  1. Levaquin 500 mg daily to complete a 14-day course.  2. Dyazide one daily.   FOLLOWUP:  1. With Dr. Ninetta Lights D. F[place] prior to return to work.  2. The patient will need followup BMP and anemia evaluation/GI workup.   HOSPITAL COURSE:  The patient is a 55 year old African-American female who  was in her usual state of health until three days prior to admission when  she noted fatigue, headache, and chills.  She progressively got weaker  because of nausea, and generally was not feeling well.  She presented to the  emergency room where she was noted to be febrile.  She was complaining of  right flank pain.  A CT of the abdomen revealed findings consistent with  pyelonephritis.  Other pertinent findings in her urinalysis revealed  trichomoniasis.  She also was anemic with an initial hemoglobin of 10.5  which fell to a low of 8.7 post hydration.  Her MCV was 73.6.  Total iron  saturation was 7% with a total iron of 19 and TIBC of 273, ferritin of 101.  B12 and folate were unremarkable.  TSH was normal at 1.4.  Initial  creatinine was 1.6 and 1.2 at the time of discharge.  Initial potassium was  2.6 and normal at the time of  discharge.  She also had a mildly elevated  total bilirubin but normal alkaline phosphatase of 2 with normal  transaminases which decreased with hydration.  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  The patient two days prior to discharge had  increasing shortness of breath and wheezing.  She was treated with  nebulizers and Solu-Medrol.  She had some acute delirium related to the Solu-  Medrol, and pretty significant tremulousness related to the albuterol nebs.  She was changed to Xopenex.  Her steroid was decreased as she really was not  wheezing.  Concern for PE was raised given the fact that the patient had an  elevated D-Dimer at the time of admission, but because of the question of a  contrast reaction, the patient herself refused the CT of the chest.  In  any  event, she underwent a VQ scan which revealed no evidence of PE.  She  received some IV Lasix. Over the course of the next  24 hours, her respiratory status and anxiety much improved.  She  subsequently grew E coli from urine cultures, and was sensitive to all  antibiotics tested.  She was discharged to home in improved condition with  follow up as noted above.     ___________________________________________                                         Hanley Hays. Josefine Class, M.D.   FED/MEDQ  D:  07/07/2004  T:  07/07/2004  Job:  [id number]   cc:   Tesfaye D. Felecia Shelling, M.D.  [address]  [place]  [place] [address]  Fax: [phone]

## 2011-05-08 NOTE — Discharge Summary (Signed)
[name], [name]               ACCOUNT NO.:  [id number]   MEDICAL RECORD NO.:  [id number]          PATIENT TYPE:  INP   LOCATION:  A339                          FACILITY:  [facility]   PHYSICIAN:  Mila Homer. Sudie Bailey, M.D.DATE OF BIRTH:  28-Jan-1956   DATE OF ADMISSION:  04/15/2006  DATE OF DISCHARGE:  04/30/2007LH                                 DISCHARGE SUMMARY   This 55 year old woman was admitted to the hospital with diverticulitis.  She had a benign five-day hospitalization extending from April 15, 2006 to  April 19, 2006.  Vital signs remained stable.   Her admission white cell count was 10,300 of which 73% neutrophils, 17  lymphs.  CMP showed a potassium 3.3, glucose 113, lipase 38.  UA normal  except for moderate leukocytes and microscopic showed 11-20 wbc's per HPF  and many bacteria.   Repeat white cell count second day is 8200 with 68% neutrophils, 18  lymphocytes.  BMP showed potassium 3.3, glucose 103, and the following day  white cell count was 11,600, 91% neutrophils, 6 lymphs, and a BMET showed  potassium now up to 3.9 with a glucose of 151.   Her CT scan of the abdomen and pelvis done on admission showed left lower  quadrant colonic wall thickening, inflammatory change consistent with  diverticulitis.  MR of her orbit showed diffuse inflammation around the  submandibular glands and sublingual glands.   She was admitted to the hospital and started on IV half normal saline KVO.  Put on HCTZ per day, Lovenox per day, Cipro 400 mg IV q.12h., Flagyl 5 mg IV  t.i.d., and Dilaudid 1-2 mg four hours p.r.n. pain.  Dilaudid had to be  stopped later that day because she started itching after getting it.  IV  then switched to half normal saline 100 mL an hour with KCl 20 mEq b.i.d.   Later that night the patient began to get swelling in the neck, became worse  overnight so that her second day there was significant and on palpation  there were actually what felt to be masses  on either side of the anterior  cervical region.  MR showed this just to be diffuse edema, however.  She was  already on morphine sulfate 2 mg IV q.3h. for severe pain at this time.  That was continued but it was felt that the masses probably represent  angioedema from the Dilaudid.  With that patient was given one dose of Solu-  Medrol 125 mg IV, potassium increased to 20 mEq t.i.d.  Then on her fourth  day Dr. Jena Gauss, GI consult, switched to Flagyl 500 mg p.o. t.i.d. and Cipro  500 mg p.o. b.i.d. and started the IV Cipro and Flagyl.   She is much improved by her fifth hospital day, ready for discharge home.   FINAL DISCHARGE DIAGNOSES:  1.  Diverticulitis.  2.  Allergic reaction to Dilaudid with angioedema of the neck.  3.  Hypokalemia probably secondary to thiazide diuretics.  4.  Benign essential hypertension.  5.  Obesity.  6.  Hypoglycemia.  7.  Atherosclerosis of the aorta.  She is discharged home on Cipro 500 mg b.i.d. for seven days (14, no  refills) and Flagyl 500 mg t.i.d. for seven days (#21, no refills).  She is  to continue her HCTZ 25 mg per day at home, but will add KCl 20 mEq daily  with this (30 with 11 refills).  Follow up in the office within a week.  She  will need a colonoscopy within eight weeks to make sure she just has  diverticula and not colon cancer.      Mila Homer. Sudie Bailey, M.D.  Electronically Signed     SDK/MEDQ  D:  04/19/2006  T:  04/19/2006  Job:  [id number]

## 2011-05-08 NOTE — Group Therapy Note (Signed)
NAMELUXE, CUADROS               ACCOUNT NO.:  [id number]   MEDICAL RECORD NO.:  [id number]          PATIENT TYPE:  INP   LOCATION:  A303                          FACILITY:  [facility]   PHYSICIAN:  Mila Homer. Sudie Bailey, M.D.DATE OF BIRTH:  [birth year]   DATE OF PROCEDURE:  04/28/2006  DATE OF DISCHARGE:                                   PROGRESS NOTE   She told me she is really feeling better and abdomen feels better. When Dr.  Karilyn Cota saw her yesterday she still had swelling in her neck so we arranged  for a couple of doses of Solu-Medrol.   Temperature is 97.7, pulse 87, respiratory rate 20, blood pressure 141/79.  She is oriented, alert, no acute distress, well-developed and obese. The  anterior neck shows less swelling than it did 2 days ago. This is barely  discernable on palpation. The abdomen is soft and obese without demonstrable  hepatosplenomegaly or mass and decreased tenderness noted in the left upper  quadrant and right lower quadrant.   Lab work showed  negative C Dif on her stool and no fecal WBCs.   ASSESSMENT:  1.  Diverticulitis.  2.  Allergic reaction to DILAUDID and MORPHINE and presumed to NARCOTICS.   PLAN:  Continue with IV Levaquin and Flagyl. Will not use any more narcotics  for pain control. Discussed with Dr. Karilyn Cota at length yesterday.      Mila Homer. Sudie Bailey, M.D.  Electronically Signed     SDK/MEDQ  D:  04/28/2006  T:  04/29/2006  Job:  [id number]

## 2011-05-08 NOTE — Consult Note (Signed)
[name], [name]               ACCOUNT NO.:  [id number]   MEDICAL RECORD NO.:  [id number]          PATIENT TYPE:  INP   LOCATION:  A339                          FACILITY:  [facility]   PHYSICIAN:  R. Roetta Sessions, M.D. DATE OF BIRTH:  1956/11/24   DATE OF CONSULTATION:  04/15/2006  DATE OF DISCHARGE:                                   CONSULTATION   REASON FOR CONSULTATION:  Possible diverticulitis.   REFERRING PHYSICIAN:  Mila Homer. Sudie Bailey, M.D.   HISTORY OF PRESENT ILLNESS:  The patient is a 55 year old lady who presents  with a one-week history of left lower quadrant abdominal pain associated  with loose stools.  She saw Dr. Sudie Bailey about a week ago when these  symptoms presented. She was given Karren Burly for the diarrhea.  Her abdominal  pain has progressed, but the diarrhea has now resolved.  Denies any bright  red blood per rectum or melena. She has had some nausea and intermittent  vomiting.  She had low-grade fever.  On presentation today, her white count  was 10,300. CT revealed colonic wall thickening at the junction of the  descending and sigmoid colon with stranding adjacent fat and adjacent  prominent lymph nodes suggestive of diverticulitis.  She has never had a  colonoscopy.  Urinalysis revealed moderate leukocyte esterase, many squamous  epithelial cells, many bacteria, 11 to 20 wbc's.  She has been started on IV  Cipro and IV Flagyl.  She is on IV narcotic medications for pain control as  well.   MEDICATIONS AT HOME:  1.  Hydrochlorothiazide 25 mg daily.  2.  Lennox p.r.n.   ALLERGIES:  DILAUDID given during this hospitalization has caused itching.  Also allergic to SULFA which causes rash and blisters.   PAST MEDICAL HISTORY:  1.  Hypertension.  2.  Asthma.  3.  E. coli sepsis secondary to pyelonephritis, hospitalized in July 2005.  4.  Status post cholecystectomy and tubal ligation.   FAMILY HISTORY:  Negative for chronic GI illnesses or colorectal  cancer.   SOCIAL HISTORY:  She is married, has 2 children, works at [employer].  Denies any tobacco or alcohol use.   REVIEW OF SYSTEMS:  See HPI for GI.  CONSTITUTIONAL: No weight loss.  GENITOURINARY: No dysuria.  CARDIOPULMONARY: No chest pain or shortness of  breath.   PHYSICAL EXAMINATION:  VITAL SIGNS: Temperature 100.8, pulse 95,  respirations 20, blood pressure 124/67.  GENERAL:  Pleasant, middle-aged, obese, flat female in no acute distress.  She appears uncomfortable.  SKIN:  Warm and dry, no jaundice.  HEENT:  Conjunctivae pink, sclerae nonicteric.  Oropharyngeal mucosa moist  and pink.  No lesions, erythema, or exudate.  NECK:  No lymphadenopathy or thyromegaly.  CHEST: Clear to auscultation.  CARDIAC: Exam reveals regular rate and rhythm, normal S1 and S2.  No  murmurs, rubs, or gallops.  ABDOMEN:  Positive bowel sounds.  Obese but symmetrical and soft. Moderate  left lower quadrant tenderness to deep palpation with subjective guarding.  No rebound tenderness.  No organomegaly or masses.  EXTREMITIES:  No edema.  LABORATORY DATA:  White count 2300, hemoglobin 12.6, hematocrit 37.8,  platelets 337,000, MCV 83.  Sodium 139, potassium 3.3, BUN 11, creatinine  1.2, glucose 113, total bilirubin 0.5, alkaline phosphatase 97, AST 22, ALT  19, albumin 3.8, lipase 38.   IMPRESSION:  The patient is a 55 year old lady with one-week history of left  lower quadrant abdominal pain and CT findings suggestive of diverticulitis.  Cannot exclude possibility of neoplasia, however.  Once current illness  resolves, would recommend colonoscopy in 6 to 8 weeks.   PLAN:  1.  Continue IV Cipro and IV Flagyl as you are doing.  2.  Continue supportive measures.  3.  Colonoscopy in 6 to 8 weeks.  4.  We will continue to follow the patient with you.   We would like to thank Dr. John Giovanni for allowing [place] to take part in  the care of this patient.      Tana Coast,  P.AJonathon Bellows, M.D.  Electronically Signed    LL/MEDQ  D:  04/15/2006  T:  04/15/2006  Job:  [id number]   cc:   Mila Homer. Sudie Bailey, M.D.  Fax: [phone]

## 2011-05-08 NOTE — Group Therapy Note (Signed)
[name], [name]               ACCOUNT NO.:  [id number]   MEDICAL RECORD NO.:  [id number]          PATIENT TYPE:  INP   LOCATION:  A303                          FACILITY:  [facility]   PHYSICIAN:  Mila Homer. Sudie Bailey, M.D.DATE OF BIRTH:  08-09-1956   DATE OF PROCEDURE:  DATE OF DISCHARGE:  04/30/2006                                   PROGRESS NOTE   She really does feel a good deal better.  She notes no more swelling of the  neck and her abdominal pain is improved.   OBJECTIVE:  VITAL SIGNS:  Temperature 98.8, pulse79, respiratory rate 20,  blood pressure 105/55.  NECK:  The anterior neck appears soft without any edema.  HEART:  Regular rhythm.  Rate of  80.  LUNGS:  Clear.  They are moving air well.  ABDOMEN:  Soft without demonstrable hepatosplenomegaly or mass and there is  minimal tenderness in the left upper quadrant on palpation, right lower  quadrant on palpation.  Bowel sounds are quiet.   ASSESSMENT:  1.  Presumptive diverticulitis.  2.  Allergic reaction to narcotics including Dilaudid and morphine.   PLAN:  1.  Continue hospitalization.  2.  Switch to Flagyl 500 mg p.o. t.i.d. and Avelox 400 mg p.o. every day and      discontinue the IV Levaquin and IV Flagyl.  3.  Continue with Vicodin 5/500 q.4h. p.r.n. pain.      Mila Homer. Sudie Bailey, M.D.  Electronically Signed     SDK/MEDQ  D:  04/29/2006  T:  04/30/2006  Job:  [id number]

## 2011-06-25 ENCOUNTER — Emergency Department (HOSPITAL_COMMUNITY)
Admission: EM | Admit: 2011-06-25 | Discharge: 2011-06-25 | Disposition: A | Discharge status: Home / Self Care | Payer: PRIVATE HEALTH INSURANCE | Attending: Emergency Medicine | Admitting: Emergency Medicine

## 2011-06-25 ENCOUNTER — Encounter (HOSPITAL_COMMUNITY): Payer: Self-pay | Admitting: Radiology

## 2011-06-25 ENCOUNTER — Emergency Department (HOSPITAL_COMMUNITY): Payer: PRIVATE HEALTH INSURANCE

## 2011-06-25 DIAGNOSIS — K859 Acute pancreatitis without necrosis or infection, unspecified: Secondary | ICD-10-CM | POA: Insufficient documentation

## 2011-06-25 DIAGNOSIS — E876 Hypokalemia: Secondary | ICD-10-CM | POA: Insufficient documentation

## 2011-06-25 DIAGNOSIS — J45909 Unspecified asthma, uncomplicated: Secondary | ICD-10-CM | POA: Insufficient documentation

## 2011-06-25 DIAGNOSIS — I10 Essential (primary) hypertension: Secondary | ICD-10-CM | POA: Insufficient documentation

## 2011-06-25 HISTORY — DX: Essential (primary) hypertension: I10

## 2011-06-25 LAB — DIFFERENTIAL
Basophils Relative: 1 % (ref 0–1)
Eosinophils Absolute: 0.5 10*3/uL (ref 0.0–0.7)
Monocytes Absolute: 0.4 10*3/uL (ref 0.1–1.0)
Monocytes Relative: 6 % (ref 3–12)

## 2011-06-25 LAB — CBC
Hemoglobin: 12 g/dL (ref 12.0–15.0)
MCH: 27.1 pg (ref 26.0–34.0)
MCHC: 32.6 g/dL (ref 30.0–36.0)
Platelets: 304 10*3/uL (ref 150–400)

## 2011-06-25 LAB — COMPREHENSIVE METABOLIC PANEL
ALT: 22 U/L (ref 0–35)
Calcium: 10.2 mg/dL (ref 8.4–10.5)
GFR calc Af Amer: >60 mL/min (ref >60)
Glucose, Bld: 115 mg/dL — ABNORMAL HIGH (ref 70–99)
Sodium: 144 mEq/L (ref 135–145)
Total Protein: 7.7 g/dL (ref 6.0–8.3)

## 2011-06-25 LAB — LIPASE, BLOOD: Lipase: 64 U/L — ABNORMAL HIGH (ref 11–59)

## 2011-06-25 LAB — URINALYSIS, ROUTINE W REFLEX MICROSCOPIC
Hgb urine dipstick: NEGATIVE
Leukocytes, UA: NEGATIVE
Nitrite: NEGATIVE
Specific Gravity, Urine: 1.025 (ref 1.005–1.030)
Urobilinogen, UA: 0.2 mg/dL (ref 0.0–1.0)

## 2011-09-11 LAB — DIFFERENTIAL
Lymphocytes Relative: 27
Monocytes Absolute: 0.8
Monocytes Relative: 10
Neutro Abs: 4.4
Neutrophils Relative %: 57

## 2011-09-11 LAB — CBC
Hemoglobin: 11.2 — ABNORMAL LOW
RBC: 4.01
WBC: 7.7

## 2011-09-11 LAB — BASIC METABOLIC PANEL
Calcium: 9.7
GFR calc Af Amer: 28 — ABNORMAL LOW
GFR calc non Af Amer: 23 — ABNORMAL LOW
Sodium: 141

## 2011-09-15 LAB — DIFFERENTIAL
Basophils Absolute: 0
Basophils Relative: 1
Eosinophils Absolute: 0.4
Eosinophils Absolute: 0.5
Eosinophils Relative: 5
Lymphs Abs: 1.7
Lymphs Abs: 2.1
Monocytes Absolute: 0.7
Monocytes Relative: 8
Neutrophils Relative %: 62
Neutrophils Relative %: 62

## 2011-09-15 LAB — BASIC METABOLIC PANEL
CO2: 27
Chloride: 108
Creatinine, Ser: 1.03
GFR calc Af Amer: >60
Potassium: 2.8 — ABNORMAL LOW
Sodium: 140

## 2011-09-15 LAB — COMPREHENSIVE METABOLIC PANEL
ALT: 23
AST: 33
CO2: 26
Calcium: 9.7
Chloride: 104
Creatinine, Ser: 1.23 — ABNORMAL HIGH
GFR calc non Af Amer: 46 — ABNORMAL LOW
Glucose, Bld: 93
Sodium: 140
Total Bilirubin: 0.6

## 2011-09-15 LAB — POCT I-STAT 4, (NA,K, GLUC, HGB,HCT)
Glucose, Bld: 114 — ABNORMAL HIGH
HCT: 36
HCT: 36
Hemoglobin: 12.2
Hemoglobin: 12.2
Operator id: 216531
Potassium: 2.6 — CL
Potassium: 3.4 — ABNORMAL LOW
Sodium: 140
Sodium: 144

## 2011-09-15 LAB — CBC
Hemoglobin: 10.1 — ABNORMAL LOW
Hemoglobin: 10.6 — ABNORMAL LOW
MCHC: 33.7
MCHC: 33.9
MCV: 79.3
MCV: 80.4
RBC: 3.72 — ABNORMAL LOW
RBC: 3.97
WBC: 7.3
WBC: 8.9

## 2012-04-10 ENCOUNTER — Emergency Department (HOSPITAL_COMMUNITY)
Admission: EM | Admit: 2012-04-10 | Discharge: 2012-04-10 | Disposition: A | Discharge status: Home / Self Care | Payer: PRIVATE HEALTH INSURANCE | Attending: Emergency Medicine | Admitting: Emergency Medicine

## 2012-04-10 ENCOUNTER — Encounter (HOSPITAL_COMMUNITY): Payer: Self-pay

## 2012-04-10 ENCOUNTER — Emergency Department (HOSPITAL_COMMUNITY): Payer: PRIVATE HEALTH INSURANCE

## 2012-04-10 DIAGNOSIS — R112 Nausea with vomiting, unspecified: Secondary | ICD-10-CM | POA: Insufficient documentation

## 2012-04-10 DIAGNOSIS — Z79899 Other long term (current) drug therapy: Secondary | ICD-10-CM | POA: Insufficient documentation

## 2012-04-10 DIAGNOSIS — I10 Essential (primary) hypertension: Secondary | ICD-10-CM | POA: Insufficient documentation

## 2012-04-10 DIAGNOSIS — R109 Unspecified abdominal pain: Secondary | ICD-10-CM | POA: Insufficient documentation

## 2012-04-10 DIAGNOSIS — K5732 Diverticulitis of large intestine without perforation or abscess without bleeding: Secondary | ICD-10-CM | POA: Insufficient documentation

## 2012-04-10 DIAGNOSIS — J45909 Unspecified asthma, uncomplicated: Secondary | ICD-10-CM | POA: Insufficient documentation

## 2012-04-10 DIAGNOSIS — R630 Anorexia: Secondary | ICD-10-CM | POA: Insufficient documentation

## 2012-04-10 DIAGNOSIS — K5792 Diverticulitis of intestine, part unspecified, without perforation or abscess without bleeding: Secondary | ICD-10-CM

## 2012-04-10 DIAGNOSIS — R197 Diarrhea, unspecified: Secondary | ICD-10-CM | POA: Insufficient documentation

## 2012-04-10 LAB — CBC
MCV: 82.4 fL (ref 78.0–100.0)
Platelets: 337 10*3/uL (ref 150–400)
RBC: 4.89 MIL/uL (ref 3.87–5.11)
RDW: 14.5 % (ref 11.5–15.5)
WBC: 11.8 10*3/uL — ABNORMAL HIGH (ref 4.0–10.5)

## 2012-04-10 LAB — BASIC METABOLIC PANEL
CO2: 26 mEq/L (ref 19–32)
Calcium: 10.7 mg/dL — ABNORMAL HIGH (ref 8.4–10.5)
GFR calc non Af Amer: 54 mL/min — ABNORMAL LOW (ref >90)
Glucose, Bld: 121 mg/dL — ABNORMAL HIGH (ref 70–99)
Potassium: 3.3 mEq/L — ABNORMAL LOW (ref 3.5–5.1)
Sodium: 139 mEq/L (ref 135–145)

## 2012-04-10 LAB — DIFFERENTIAL
Basophils Absolute: 0 10*3/uL (ref 0.0–0.1)
Eosinophils Relative: 2 % (ref 0–5)
Lymphocytes Relative: 20 % (ref 12–46)
Lymphs Abs: 2.4 10*3/uL (ref 0.7–4.0)
Neutrophils Relative %: 71 % (ref 43–77)

## 2012-04-10 MED ORDER — SODIUM CHLORIDE 0.9 % IV SOLN
Freq: Once | INTRAVENOUS | Status: AC
  Administered 2012-04-10: 10:00:00 via INTRAVENOUS

## 2012-04-10 MED ORDER — ONDANSETRON HCL 4 MG/2ML IJ SOLN
4.0000 mg | Freq: Once | INTRAMUSCULAR | Status: AC
  Administered 2012-04-10: 4 mg via INTRAVENOUS
  Filled 2012-04-10: qty 2

## 2012-04-10 MED ORDER — CIPROFLOXACIN IN D5W 400 MG/200ML IV SOLN: 400.0000 mg | Freq: Once | INTRAVENOUS | Status: DC

## 2012-04-10 MED ORDER — HYDROMORPHONE HCL PF 1 MG/ML IJ SOLN
1.0000 mg | Freq: Once | INTRAMUSCULAR | Status: AC
  Administered 2012-04-10: 1 mg via INTRAVENOUS
  Filled 2012-04-10: qty 1

## 2012-04-10 MED ORDER — HYDROCODONE-ACETAMINOPHEN 5-325 MG PO TABS: 1.0000 | ORAL_TABLET | ORAL | Status: AC | PRN

## 2012-04-10 MED ORDER — ONDANSETRON HCL 4 MG/2ML IJ SOLN: 4.0000 mg | Freq: Once | INTRAMUSCULAR | Status: DC

## 2012-04-10 MED ORDER — METRONIDAZOLE 500 MG PO TABS: 500.0000 mg | ORAL_TABLET | Freq: Two times a day (BID) | ORAL | Status: AC

## 2012-04-10 MED ORDER — CIPROFLOXACIN IN D5W 400 MG/200ML IV SOLN
400.0000 mg | Freq: Once | INTRAVENOUS | Status: AC
Start: 2012-04-10 — End: 2012-04-10
  Administered 2012-04-10: 400 mg via INTRAVENOUS
  Filled 2012-04-10: qty 200

## 2012-04-10 MED ORDER — CIPROFLOXACIN HCL 250 MG PO TABS: 500.0000 mg | ORAL_TABLET | Freq: Two times a day (BID) | ORAL | Status: AC

## 2012-04-10 MED ORDER — METRONIDAZOLE IN NACL 5-0.79 MG/ML-% IV SOLN: 500.0000 mg | Freq: Once | INTRAVENOUS | Status: DC

## 2012-04-10 MED ORDER — PROMETHAZINE HCL 25 MG PO TABS: 12.5000 mg | ORAL_TABLET | Freq: Four times a day (QID) | ORAL | Status: DC | PRN

## 2012-04-10 MED ORDER — METRONIDAZOLE IN NACL 5-0.79 MG/ML-% IV SOLN
500.0000 mg | Freq: Once | INTRAVENOUS | Status: AC
  Administered 2012-04-10: 500 mg via INTRAVENOUS
  Filled 2012-04-10: qty 100

## 2012-04-10 MED ORDER — HYDROMORPHONE HCL PF 1 MG/ML IJ SOLN: 1.0000 mg | Freq: Once | INTRAMUSCULAR | Status: DC

## 2012-04-10 NOTE — ED Provider Notes (Signed)
History   This chart was scribed for [employer]. Colon Branch, MD by Clarita Crane. The patient was seen in room APA01/APA01. Patient's care was started at 0852.    CSN: [id number]  Arrival date & time 04/10/12  4098   First MD Initiated Contact with Patient 04/10/12 [phone]      Chief Complaint  Patient presents with  . Abdominal Pain  .     (Consider location/radiation/quality/duration/timing/severity/associated sxs/prior treatment) HPI EDDA OREA is a 56 y.o. female who presents to the Emergency Department complaining of constant moderate left sided abdominal pain radiating to right side of abdomen described as sharp onset 1 week ago and persistent since with associated nausea and vomiting for the past 6 days, decreased appetite and diarrhea. Notes abdominal pain is worse with palpation and walking. Denies hematuria, blood in stool, increased SOB, chest pain, fever, chills. Patient reports she was evaluated by a physician 1 week ago and was prescribed a 10 day course of Ciprofloxacin without improvement in symptoms since. Patient with h/o cholecystectomy, HTN and asthma but denies h/o kidney stones, appendectomy.  PCP- Sudie Bailey  Past Medical History  Diagnosis Date  . Hypertension   . Asthma     Past Surgical History  Procedure Date  . Abdominal hysterectomy   . Cholecystectomy     No family history on file.  History  Substance Use Topics  . Smoking status: Never Smoker   . Smokeless tobacco: Not on file  . Alcohol Use: No    OB History    Grav Para Term Preterm Abortions TAB SAB Ect Mult Living                  Review of Systems A complete 10 system review of systems was obtained and all systems are negative except as noted in the HPI and PMH.   Allergies  Iohexol and Sulfa antibiotics  Home Medications   Current Outpatient Rx  Name Route Sig Dispense Refill  . CIPROFLOXACIN HCL 500 MG PO TABS Oral Take 500 mg by mouth 2 (two) times daily.    Marland Kitchen  HYDROCHLOROTHIAZIDE 25 MG PO TABS Oral Take 25 mg by mouth daily.      BP 146/73  Pulse 94  Temp(Src) 97.7 F (36.5 C) (Oral)  Resp 22  Ht 4\' 11"  (1.499 m)  Wt 192 lb (87.091 kg)  BMI 38.78 kg/m2  SpO2 97%  Physical Exam  Nursing note and vitals reviewed. Constitutional: She is oriented to person, place, and time. She appears well-developed and well-nourished.       Grimacing. Uncomfortable appearing.   HENT:  Head: Normocephalic and atraumatic.  Eyes: EOM are normal. Pupils are equal, round, and reactive to light.  Neck: Neck supple. No tracheal deviation present.  Cardiovascular: Normal rate and regular rhythm.  Exam reveals no gallop and no friction rub.   No murmur heard. Pulmonary/Chest: Effort normal. No respiratory distress. She has no wheezes. She has no rales.  Abdominal: Soft. Bowel sounds are normal. She exhibits no distension. There is tenderness (diffuse, more pronouced within left side the right though). There is rebound. There is no guarding.  Musculoskeletal: Normal range of motion. She exhibits no edema.  Neurological: She is alert and oriented to person, place, and time. No sensory deficit.  Skin: Skin is warm and dry.  Psychiatric: She has a normal mood and affect. Her behavior is normal.    ED Course  Procedures (including critical care time)  DIAGNOSTIC STUDIES: Oxygen Saturation is  98% on room air, normal by my interpretation.    COORDINATION OF CARE: 9:15AM- Patient informed of current plan for treatment and evaluation and agrees with plan at this time.  1:14PM- Patient informed of diagnosis and prescribed two antibiotics. Patient agreed to manage condition at home and will be discharged after treatment with antibiotics, pain and nausea prescriptions.  Results for orders placed during the hospital encounter of 04/10/12  CBC      Component Value Range   WBC 11.8 (*) 4.0 - 10.5 (K/uL)   RBC 4.89  3.87 - 5.11 (MIL/uL)   Hemoglobin 13.1  12.0 - 15.0  (g/dL)   HCT 16.1  09.6 - 04.5 (%)   MCV 82.4  78.0 - 100.0 (fL)   MCH 26.8  26.0 - 34.0 (pg)   MCHC 32.5  30.0 - 36.0 (g/dL)   RDW 40.9  81.1 - 91.4 (%)   Platelets 337  150 - 400 (K/uL)  DIFFERENTIAL      Component Value Range   Neutrophils Relative 71  43 - 77 (%)   Neutro Abs 8.4 (*) 1.7 - 7.7 (K/uL)   Lymphocytes Relative 20  12 - 46 (%)   Lymphs Abs 2.4  0.7 - 4.0 (K/uL)   Monocytes Relative 6  3 - 12 (%)   Monocytes Absolute 0.7  0.1 - 1.0 (K/uL)   Eosinophils Relative 2  0 - 5 (%)   Eosinophils Absolute 0.2  0.0 - 0.7 (K/uL)   Basophils Relative 0  0 - 1 (%)   Basophils Absolute 0.0  0.0 - 0.1 (K/uL)  BASIC METABOLIC PANEL      Component Value Range   Sodium 139  135 - 145 (mEq/L)   Potassium 3.3 (*) 3.5 - 5.1 (mEq/L)   Chloride 101  96 - 112 (mEq/L)   CO2 26  19 - 32 (mEq/L)   Glucose, Bld 121 (*) 70 - 99 (mg/dL)   BUN 13  6 - 23 (mg/dL)   Creatinine, Ser 7.82 (*) 0.50 - 1.10 (mg/dL)   Calcium 95.6 (*) 8.4 - 10.5 (mg/dL)   GFR calc non Af Amer 54 (*) >90 (mL/min)   GFR calc Af Amer 63 (*) >90 (mL/min)    Ct Abdomen Pelvis Wo Contrast  04/10/2012  *RADIOLOGY REPORT*  Clinical Data: Left-sided pain.  Rule out diverticulitis.  Contrast allergy.  CT ABDOMEN AND PELVIS WITHOUT CONTRAST  Technique:  Multidetector CT imaging of the abdomen and pelvis was performed following the standard protocol without intravenous contrast.  Comparison: 07/15/2011  Findings: Minimal nodularity at the right lung base on image 4. Approximately 3 mm. Not definitely present on the prior.  Minimal bibasilar dependent atelectasis.  Mild cardiomegaly, without pericardial or pleural effusion.  Moderate hepatic steatosis.  Normal spleen, stomach, pancreas. Cholecystectomy without biliary ductal dilatation.  Normal adrenal glands and kidneys.  A left periaortic node measures 1.5 cm on image 41 and is unchanged since the prior exam.  There is also a low density lesion, likely representing a node, adjacent  the left adrenal gland and at the celiac axis.  This measures 1.3 cm on image 29 and is new or enlarged since the prior.  Sigmoid diverticulosis.  Focal wall thickening and edema at the proximal descending colon.  Example image 68.  Scattered diverticula throughout remainder of the colon. Colonic stool burden suggests constipation.  Normal terminal ileum and appendix.  Normal small bowel without abdominal ascites.    No pelvic adenopathy.  Normal urinary bladder.  Prior tubal ligation.  Nonspecific calcification within the uterus.  Normal adnexa with trace cul-de-sac fluid, likely secondary.  No free perforation or diverticular abscess.  Facet arthropathy in the lower lumbar spine. No acute osseous abnormality.  IMPRESSION:  1.  Uncomplicated proximal sigmoid diverticulitis. 2.  Similar left periaortic retroperitoneal adenopathy.  New celiac axis adenopathy with suggestion of hypoattenuation / necrosis. Appearance is nonspecific.  Although these nodes could be reactive, a lymphoproliferative process or metastasis cannot be excluded. Correlate with any history of primary abdominal malignancy. Consider further characterization with PET. 3.  Hepatic steatosis. 4.  Minimal nodularity at right lung base.  Given risk factors for bronchogenic carcinoma, follow-up chest CT at 1 year is recommended.  This recommendation follows the consensus statement: "Guidelines for Management of Small Pulmonary Nodules Detected on CT Scans:  A Statement from the Fleischner Society" as published in Radiology 2005; 237:395-400.  Available online at: DietDisorder.cz.  Original Report Authenticated By: Consuello Bossier, M.D.    MDM  Patient with left flank pain and left-sided abdominal pain since last week where antibiotics were initiated. Has continued to have discomfort associated with some mild nausea pain is a knowing continuous pain and is worse in the left lower quarter. Single exam with diffuse  tenderness over the entire abdomen with slightly more focality in the left lower quadrant. She was given IV fluids analgesics, and anti-medics with improvement in the pain. CT scan shows diverticulitis. CT also with some nodularity at the right lung base that will require followup CT in a year. There is also some retroperitoneal lymphadenopathy that can be reevaluated once this current infection has been treated. Initiated IV antibiotic therapy with Cipro and Flagyl. Pt feels improved after observation and/or treatment in ED.Pt stable in ED with no significant deterioration in condition.The patient appears reasonably screened and/or stabilized for discharge and I doubt any other medical condition or other [facility] requiring further screening, evaluation, or treatment in the ED at this time prior to discharge.  I personally performed the services described in this documentation, which was scribed in my presence. The recorded information has been reviewed and considered.   MDM Reviewed: nursing note and vitals Interpretation: labs and CT scan     Nicoletta Dress. Colon Branch, MD 04/10/12 1335

## 2012-04-10 NOTE — ED Notes (Signed)
Patient with no complaints at this time. Respirations even and unlabored. Skin warm/dry. Discharge instructions reviewed with patient at this time. Patient given opportunity to voice concerns/ask questions. IV removed per policy and band-aid applied to site. Patient discharged at this time and left Emergency Department with steady gait.  

## 2012-04-10 NOTE — ED Notes (Signed)
Patient unable to give urine specimen at this time.  Advised her to use call bell when she could.

## 2012-04-10 NOTE — ED Notes (Signed)
Meal tray ordered 

## 2012-04-10 NOTE — ED Notes (Signed)
Pt reports left flank/ ab pain x2 weeks, has been to pmd and given cipro, started having dizziness last night that has gotten worse.  +nausea and vomiting, ?fever at home and chills

## 2012-04-10 NOTE — Discharge Instructions (Signed)
Your CT scan shows diverticulitis for antibiotics. He has the pain and nausea medicine as needed. Follow up with Dr. Sudie Bailey. A CT scan also showed some nodules in the right lung base. Recommended followup is a CT done at 1 year. Dr. Sudie Bailey can make arrangements for that CT.   Diverticulitis A diverticulum is a small pouch or sac on the colon. Diverticulosis is the presence of these diverticula on the colon. Diverticulitis is the irritation (inflammation) or infection of diverticula. CAUSES  The colon and its diverticula contain bacteria. If food particles block the tiny opening to a diverticulum, the bacteria inside can grow and cause an increase in pressure. This leads to infection and inflammation and is called diverticulitis. SYMPTOMS   Abdominal pain and tenderness. Usually, the pain is located on the left side of your abdomen. However, it could be located elsewhere.   Fever.   Bloating.   Feeling sick to your stomach (nausea).   Throwing up (vomiting).   Abnormal stools.  DIAGNOSIS  Your caregiver will take a history and perform a physical exam. Since many things can cause abdominal pain, other tests may be necessary. Tests may include:  Blood tests.   Urine tests.   X-ray of the abdomen.   CT scan of the abdomen.  Sometimes, surgery is needed to determine if diverticulitis or other conditions are causing your symptoms. TREATMENT  Most of the time, you can be treated without surgery. Treatment includes:  Resting the bowels by only having liquids for a few days. As you improve, you will need to eat a low-fiber diet.   Intravenous (IV) fluids if you are losing body fluids (dehydrated).   Antibiotic medicines that treat infections may be given.   Pain and nausea medicine, if needed.   Surgery if the inflamed diverticulum has burst.  HOME CARE INSTRUCTIONS   Try a clear liquid diet (broth, tea, or water for as long as directed by your caregiver). You may then  gradually begin a low-fiber diet as tolerated. A low-fiber diet is a diet with less than 10 grams of fiber. Choose the foods below to reduce fiber in the diet:   White breads, cereals, rice, and pasta.   Cooked fruits and vegetables or soft fresh fruits and vegetables without the skin.   Ground or well-cooked tender beef, ham, veal, lamb, pork, or poultry.   Eggs and seafood.   After your diverticulitis symptoms have improved, your caregiver may put you on a high-fiber diet. A high-fiber diet includes 14 grams of fiber for every 1000 calories consumed. For a standard 2000 calorie diet, you would need 28 grams of fiber. Follow these diet guidelines to help you increase the fiber in your diet. It is important to slowly increase the amount fiber in your diet to avoid gas, constipation, and bloating.   Choose whole-grain breads, cereals, pasta, and brown rice.   Choose fresh fruits and vegetables with the skin on. Do not overcook vegetables because the more vegetables are cooked, the more fiber is lost.   Choose more nuts, seeds, legumes, dried peas, beans, and lentils.   Look for food products that have greater than 3 grams of fiber per serving on the Nutrition Facts label.   Take all medicine as directed by your caregiver.   If your caregiver has given you a follow-up appointment, it is very important that you go. Not going could result in lasting (chronic) or permanent injury, pain, and disability. If there is any problem keeping  the appointment, call to reschedule.  SEEK MEDICAL CARE IF:   Your pain does not improve.   You have a hard time advancing your diet beyond clear liquids.   Your bowel movements do not return to normal.  SEEK IMMEDIATE MEDICAL CARE IF:   Your pain becomes worse.   You have an oral temperature above 102 F (38.9 C), not controlled by medicine.   You have repeated vomiting.   You have bloody or black, tarry stools.   Symptoms that brought you to your  caregiver become worse or are not getting better.  MAKE SURE YOU:   Understand these instructions.   Will watch your condition.   Will get help right away if you are not doing well or get worse.  Document Released: 09/16/2005 Document Revised: 11/26/2011 Document Reviewed: 01/12/2011 [facility] Patient Information 2012 [place], [place].

## 2012-04-12 ENCOUNTER — Encounter (HOSPITAL_COMMUNITY): Payer: Self-pay | Admitting: Emergency Medicine

## 2012-04-12 ENCOUNTER — Emergency Department (HOSPITAL_COMMUNITY): Payer: PRIVATE HEALTH INSURANCE

## 2012-04-12 ENCOUNTER — Inpatient Hospital Stay (HOSPITAL_COMMUNITY)
Admission: EM | Admit: 2012-04-12 | Discharge: 2012-04-14 | DRG: 392 | Disposition: A | Discharge status: Home / Self Care | Payer: PRIVATE HEALTH INSURANCE | Attending: Family Medicine | Admitting: Family Medicine

## 2012-04-12 DIAGNOSIS — K7689 Other specified diseases of liver: Secondary | ICD-10-CM | POA: Diagnosis present

## 2012-04-12 DIAGNOSIS — K5792 Diverticulitis of intestine, part unspecified, without perforation or abscess without bleeding: Secondary | ICD-10-CM

## 2012-04-12 DIAGNOSIS — R599 Enlarged lymph nodes, unspecified: Secondary | ICD-10-CM | POA: Diagnosis present

## 2012-04-12 DIAGNOSIS — Z6839 Body mass index (BMI) 39.0-39.9, adult: Secondary | ICD-10-CM

## 2012-04-12 DIAGNOSIS — J45909 Unspecified asthma, uncomplicated: Secondary | ICD-10-CM | POA: Diagnosis present

## 2012-04-12 DIAGNOSIS — K5732 Diverticulitis of large intestine without perforation or abscess without bleeding: Principal | ICD-10-CM | POA: Diagnosis present

## 2012-04-12 DIAGNOSIS — R935 Abnormal findings on diagnostic imaging of other abdominal regions, including retroperitoneum: Secondary | ICD-10-CM | POA: Diagnosis present

## 2012-04-12 DIAGNOSIS — I10 Essential (primary) hypertension: Secondary | ICD-10-CM | POA: Diagnosis present

## 2012-04-12 DIAGNOSIS — J309 Allergic rhinitis, unspecified: Secondary | ICD-10-CM | POA: Diagnosis present

## 2012-04-12 DIAGNOSIS — E876 Hypokalemia: Secondary | ICD-10-CM | POA: Diagnosis present

## 2012-04-12 HISTORY — DX: Diverticulitis of intestine, part unspecified, without perforation or abscess without bleeding: K57.92

## 2012-04-12 LAB — COMPREHENSIVE METABOLIC PANEL
ALT: 25 U/L (ref 0–35)
Alkaline Phosphatase: 150 U/L — ABNORMAL HIGH (ref 39–117)
BUN: 14 mg/dL (ref 6–23)
CO2: 28 mEq/L (ref 19–32)
GFR calc Af Amer: 64 mL/min — ABNORMAL LOW (ref >90)
GFR calc non Af Amer: 55 mL/min — ABNORMAL LOW (ref >90)
Glucose, Bld: 99 mg/dL (ref 70–99)
Potassium: 3.4 mEq/L — ABNORMAL LOW (ref 3.5–5.1)
Sodium: 139 mEq/L (ref 135–145)
Total Bilirubin: 0.2 mg/dL — ABNORMAL LOW (ref 0.3–1.2)

## 2012-04-12 LAB — CBC
Hemoglobin: 11.6 g/dL — ABNORMAL LOW (ref 12.0–15.0)
MCV: 83 fL (ref 78.0–100.0)
Platelets: 358 10*3/uL (ref 150–400)
RBC: 4.36 MIL/uL (ref 3.87–5.11)
WBC: 7 10*3/uL (ref 4.0–10.5)

## 2012-04-12 LAB — DIFFERENTIAL
Lymphocytes Relative: 36 % (ref 12–46)
Lymphs Abs: 2.5 10*3/uL (ref 0.7–4.0)
Monocytes Relative: 7 % (ref 3–12)
Neutrophils Relative %: 49 % (ref 43–77)

## 2012-04-12 MED ORDER — IBUPROFEN 400 MG PO TABS
400.0000 mg | ORAL_TABLET | Freq: Once | ORAL | Status: AC
  Administered 2012-04-12: 400 mg via ORAL
  Filled 2012-04-12: qty 1

## 2012-04-12 MED ORDER — METRONIDAZOLE IN NACL 5-0.79 MG/ML-% IV SOLN
500.0000 mg | Freq: Once | INTRAVENOUS | Status: AC
  Administered 2012-04-12: 500 mg via INTRAVENOUS
  Filled 2012-04-12: qty 100

## 2012-04-12 MED ORDER — SODIUM CHLORIDE 0.9 % IV SOLN
INTRAVENOUS | Status: DC
  Administered 2012-04-13: 1000 mL via INTRAVENOUS

## 2012-04-12 MED ORDER — CIPROFLOXACIN IN D5W 400 MG/200ML IV SOLN
400.0000 mg | Freq: Two times a day (BID) | INTRAVENOUS | Status: DC
  Administered 2012-04-13 (×2): 400 mg via INTRAVENOUS
  Filled 2012-04-12 (×4): qty 200

## 2012-04-12 MED ORDER — HYDROMORPHONE HCL PF 1 MG/ML IJ SOLN
2.0000 mg | INTRAMUSCULAR | Status: DC | PRN
  Administered 2012-04-12 (×2): 2 mg via INTRAVENOUS
  Filled 2012-04-12: qty 2
  Filled 2012-04-12: qty 1

## 2012-04-12 MED ORDER — METRONIDAZOLE IN NACL 5-0.79 MG/ML-% IV SOLN
500.0000 mg | Freq: Three times a day (TID) | INTRAVENOUS | Status: DC
  Administered 2012-04-12 – 2012-04-13 (×3): 500 mg via INTRAVENOUS
  Filled 2012-04-12 (×6): qty 100

## 2012-04-12 MED ORDER — CIPROFLOXACIN IN D5W 400 MG/200ML IV SOLN
400.0000 mg | Freq: Once | INTRAVENOUS | Status: AC
  Administered 2012-04-12: 400 mg via INTRAVENOUS
  Filled 2012-04-12: qty 200

## 2012-04-12 MED ORDER — SODIUM CHLORIDE 0.9 % IV SOLN
INTRAVENOUS | Status: AC
  Administered 2012-04-12: 14:00:00 via INTRAVENOUS

## 2012-04-12 MED ORDER — SODIUM CHLORIDE 0.9 % IV SOLN
Freq: Once | INTRAVENOUS | Status: AC
  Administered 2012-04-12: 11:00:00 via INTRAVENOUS

## 2012-04-12 MED ORDER — POTASSIUM CHLORIDE CRYS ER 20 MEQ PO TBCR
60.0000 meq | EXTENDED_RELEASE_TABLET | Freq: Once | ORAL | Status: AC
  Administered 2012-04-12: 60 meq via ORAL
  Filled 2012-04-12: qty 3

## 2012-04-12 MED ORDER — ACETAMINOPHEN 500 MG PO TABS: 500.0000 mg | ORAL_TABLET | ORAL | Status: DC | PRN

## 2012-04-12 MED ORDER — ONDANSETRON 8 MG/NS 50 ML IVPB
8.0000 mg | Freq: Four times a day (QID) | INTRAVENOUS | Status: DC | PRN
  Filled 2012-04-12: qty 8

## 2012-04-12 MED ORDER — HYDROCODONE-ACETAMINOPHEN 5-325 MG PO TABS
1.0000 | ORAL_TABLET | Freq: Once | ORAL | Status: AC
  Administered 2012-04-12: 1 via ORAL
  Filled 2012-04-12: qty 1

## 2012-04-12 NOTE — ED Notes (Signed)
Pt c/o abdominal pain, pain in her sides. Seen here 4/21.

## 2012-04-12 NOTE — ED Notes (Signed)
Report given to receiving RN.

## 2012-04-12 NOTE — ED Notes (Signed)
Pt transferred via stretcher to room 318. NAD noted. Family with pt.

## 2012-04-12 NOTE — H&P (Signed)
NAMELONNETTE, SHRODE               ACCOUNT NO.:  [id number]  MEDICAL RECORD NO.:  [id number]  LOCATION:  APA12                         FACILITY:  [facility]  PHYSICIAN:  Mila Homer. Sudie Bailey, M.D.DATE OF BIRTH:  1956-08-11  DATE OF ADMISSION:  04/12/2012 DATE OF DISCHARGE:  LH                             HISTORY & PHYSICAL   This 56 year old woman was seen in the [facility] emergency department 2 nights ago.  She was found to have diverticulitis and admission was recommended.  However, she preferred to try to treat this outpatient.  She was started on Cipro 500 mg b.i.d. and metronidazole 500 mg b.i.d.  I saw her in the office yesterday, at which time she was having left-sided abdominal pain.  Since yesterday, she continues to have left-sided abdominal pain, but now she is having right-sided abdominal pain as well.  Further she tells me that she is throwing up when she tries to eat or drink.  A CT scan of the abdomen and pelvis done 2 nights ago showed what was felt to be uncomplicated proximal sigmoid diverticulitis.  She also had periaortic retroperitoneal adenopathy, which had been noted on a CT scan of July 15, 2011.  She also had new celiac axis adenopathy with a question of necrosis.  Hepatic steatosis was also noted and minimal nodularity at the right lung base.  CHRONIC MEDICAL PROBLEMS: 1. Allergic rhinitis. 2. Asthma. 3. Morbid obesity. 4. Benign essential hypertension. 5. History of tobacco use. 6. Diverticulosis.  CURRENT MEDICATIONS: 1. Cipro and metronidazole mentioned above. 2. Hydrocodone/APAP 5/325 q.4 hours for pain. 3. Promethazine 25 mg half a tablet q.6 hours for nausea. 4. 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 5. Fluticasone nasal spray. 6. Triamterene/HCTZ 75/50 half a tab daily. 7. ProAir HFA 2 puffs q.4 hours p.r.n. breathing. 8. Diclofenac 75 mg up to b.i.d.  ALLERGIES:  She is allergic to SULFA AND CONTRAST  MEDIA UP, IODINE BASED.  Reaction to sulfa is severe and that to contrast medium is mild to moderate.  PHYSICAL EXAMINATION:  GENERAL APPEARANCE:  Exam in the office today showed a pleasant middle-aged woman who is in distress due to abdominal pain. VITAL SIGNS:  Her height was 59 inches, weight 199 pounds giving her a BMI of 40.2.  Her blood pressure is 118/78 and pulse 68.  She walked in the office.  She was alert and oriented. INTEGUMENT:  Skin turgor actually look good.  The mucous membranes appeared to be moist. CARDIOVASCULAR:  Her heart had a regular rhythm without murmur at rate of 70. CHEST:  Her lungs are clear throughout. LYMPH:  She had no axillary or supraclavicular adenopathy. ABDOMEN:  Soft, but she had at least moderate tenderness on palpation throughout the abdomen. EXTREMITIES:  There is no edema of the ankles.  ADMISSION DIAGNOSES: 1. Acute diverticulitis. 2. Abnormal CT scan of the abdomen. 3. Benign essential hypertension. 4. Morbid obesity. 5. Allergic rhinitis. I have talked to the emergency room doctor at [facility].  I am sending her to the emergency room to start the workup since all beds are full and we are waiting for planned discharges before we  can fully admit her.  She will need to be started on IV antibiotics and IV fluids.  I will get a surgical and GI consultations.     Mila Homer. Sudie Bailey, M.D.     SDK/MEDQ  D:  04/12/2012  T:  04/12/2012  Job:  [id number]

## 2012-04-12 NOTE — ED Provider Notes (Signed)
History  This chart was scribed for [employer]. Colon Branch, MD by Cherlynn Perches. The patient was seen in room APA12/APA12. Patient's care was started at 1027.  CSN: [id number]  Arrival date & time 04/12/12  1027   First MD Initiated Contact with Patient 04/12/12 1030      Chief Complaint  Patient presents with  . Abdominal Pain    (Consider location/radiation/quality/duration/timing/severity/associated sxs/prior treatment) HPI  [name] is a 56 y.o. female with a h/o diverticulitis who presents to the Emergency Department complaining of 10 days of persistent, constant, moderate left sided and center abdominal pain with associated nausea, vomiting, decreased appetite, mild fever, and diarrhea. Pt notes that abdominal pain is worse with palpation and walking. Pt was seen in this ED 2 days ago and prescribed Cipro and Flagyl, which has relieved her fever, but provided little relief from other symptoms. Pt also states that the pain is now encompassing the center of her abdomen in addition to the left side. Pt saw her PCP this morning, and he referred to the ED for hospital admission. Pt states that she has not been eating much for the past week, but has been able to drink fluids. Pt denies smoking and alcohol use. PCP - Dr. Sudie Bailey  Past Medical History  Diagnosis Date  . Hypertension   . Asthma   . Diverticulitis     Past Surgical History  Procedure Date  . Cholecystectomy   . Ablation colpoclesis     Family History  Problem Relation Age of Onset  . Emphysema Mother   . Stroke Father   . Hypertension Father   . Diabetes Sister   . Hypertension Sister   . Stroke Brother   . Hypertension Brother     History  Substance Use Topics  . Smoking status: Never Smoker   . Smokeless tobacco: Not on file  . Alcohol Use: No    OB History    Grav Para Term Preterm Abortions TAB SAB Ect Mult Living   3 2 2  1  1          Review of Systems  Constitutional: Positive for fever  and appetite change.  HENT: Negative for congestion and sore throat.   Respiratory: Negative for cough and shortness of breath.   Cardiovascular: Negative for chest pain.  Gastrointestinal: Positive for nausea, vomiting, abdominal pain and diarrhea.   A complete 10 system review of systems was obtained and all systems are negative except as noted in the HPI and PMH.    Allergies  Iohexol and Sulfa antibiotics  Home Medications   Current Outpatient Rx  Name Route Sig Dispense Refill  . CIPROFLOXACIN HCL 250 MG PO TABS Oral Take 2 tablets (500 mg total) by mouth every 12 (twelve) hours. 20 tablet 0  . CIPROFLOXACIN HCL 500 MG PO TABS Oral Take 500 mg by mouth 2 (two) times daily.    Marland Kitchen HYDROCHLOROTHIAZIDE 25 MG PO TABS Oral Take 25 mg by mouth daily.    Marland Kitchen HYDROCODONE-ACETAMINOPHEN 5-325 MG PO TABS Oral Take 1 tablet by mouth every 4 (four) hours as needed for pain. 15 tablet 0  . METRONIDAZOLE 500 MG PO TABS Oral Take 1 tablet (500 mg total) by mouth 2 (two) times daily. 20 tablet 0  . PROMETHAZINE HCL 25 MG PO TABS Oral Take 0.5 tablets (12.5 mg total) by mouth every 6 (six) hours as needed for nausea. 10 tablet 0    Triage Vitals: BP 130/74  Pulse 80  Temp(Src) 97.8 F (36.6 C) (Oral)  Resp 16  Ht 4\' 11"  (1.499 m)  Wt 197 lb (89.359 kg)  BMI 39.79 kg/m2  SpO2 100%  Physical Exam  Nursing note and vitals reviewed. Constitutional: She is oriented to person, place, and time. She appears well-developed and well-nourished.  HENT:  Head: Normocephalic and atraumatic.  Eyes: Conjunctivae are normal. Pupils are equal, round, and reactive to light.  Cardiovascular: Normal rate, regular rhythm and normal heart sounds.   Pulmonary/Chest: Effort normal and breath sounds normal. No respiratory distress.  Abdominal: Soft. Bowel sounds are normal. There is tenderness (focal tenderness LLQ and around umbilicus). There is rebound and guarding.  Musculoskeletal: Normal range of motion. She  exhibits no edema.  Neurological: She is alert and oriented to person, place, and time.  Skin: Skin is warm and dry.  Psychiatric: She has a normal mood and affect. Her behavior is normal.    ED Course  Procedures (including critical care time)  DIAGNOSTIC STUDIES: Oxygen Saturation is 100% on room air, normal by my interpretation.    COORDINATION OF CARE: 10:59AM - discussed IV and admission to hospital. Pt agrees with plan.   Results for orders placed during the hospital encounter of 04/12/12  CBC      Component Value Range   WBC 7.0  4.0 - 10.5 (K/uL)   RBC 4.36  3.87 - 5.11 (MIL/uL)   Hemoglobin 11.6 (*) 12.0 - 15.0 (g/dL)   HCT 40.9  81.1 - 91.4 (%)   MCV 83.0  78.0 - 100.0 (fL)   MCH 26.6  26.0 - 34.0 (pg)   MCHC 32.0  30.0 - 36.0 (g/dL)   RDW 78.2  95.6 - 21.3 (%)   Platelets 358  150 - 400 (K/uL)  DIFFERENTIAL      Component Value Range   Neutrophils Relative 49  43 - 77 (%)   Neutro Abs 3.4  1.7 - 7.7 (K/uL)   Lymphocytes Relative 36  12 - 46 (%)   Lymphs Abs 2.5  0.7 - 4.0 (K/uL)   Monocytes Relative 7  3 - 12 (%)   Monocytes Absolute 0.5  0.1 - 1.0 (K/uL)   Eosinophils Relative 7 (*) 0 - 5 (%)   Eosinophils Absolute 0.5  0.0 - 0.7 (K/uL)   Basophils Relative 1  0 - 1 (%)   Basophils Absolute 0.1  0.0 - 0.1 (K/uL)  COMPREHENSIVE METABOLIC PANEL      Component Value Range   Sodium 139  135 - 145 (mEq/L)   Potassium 3.4 (*) 3.5 - 5.1 (mEq/L)   Chloride 100  96 - 112 (mEq/L)   CO2 28  19 - 32 (mEq/L)   Glucose, Bld 99  70 - 99 (mg/dL)   BUN 14  6 - 23 (mg/dL)   Creatinine, Ser 0.86  0.50 - 1.10 (mg/dL)   Calcium 57.8 (*) 8.4 - 10.5 (mg/dL)   Total Protein 8.2  6.0 - 8.3 (g/dL)   Albumin 4.1  3.5 - 5.2 (g/dL)   AST 32  0 - 37 (U/L)   ALT 25  0 - 35 (U/L)   Alkaline Phosphatase 150 (*) 39 - 117 (U/L)   Total Bilirubin 0.2 (*) 0.3 - 1.2 (mg/dL)   GFR calc non Af Amer 55 (*) >90 (mL/min)   GFR calc Af Amer 64 (*) >90 (mL/min)          MDM  Patient  seen by me in the ER on 04/10/2012 and diagnosed  with diverticulitis by CT. She was discharged home on metronidazole and Cipro with pain medicine and nausea medicine. She has continued to have pain despite the medicine. She was seen by her primary care physician Dr. Sudie Bailey who recommended admission to the hospital. I have completed the temporary admission orders on his behalf, obtained a new laboratory data which is unremarkable except for slightly low potassium. I have repeated the potassium. She has received by mouth analgesic and anti-inflammatory with relief of her current pain. She is asked to try eating a regular meal which has been well.Pt stable in ED with no significant deterioration in condition.The patient appears reasonably stabilized for admission considering the current resources, flow, and capabilities available in the ED at this time, and I doubt any other [facility] requiring further screening and/or treatment in the ED prior to admission.  I personally performed the services described in this documentation, which was scribed in my presence. The recorded information has been reviewed and considered.   MDM Reviewed: nursing note, vitals and previous chart Reviewed previous: labs and CT scan Interpretation: labs Consults: primary care provider           Nicoletta Dress. Colon Branch, MD 04/12/12 1239

## 2012-04-13 LAB — BASIC METABOLIC PANEL
BUN: 11 mg/dL (ref 6–23)
GFR calc Af Amer: 68 mL/min — ABNORMAL LOW (ref >90)
GFR calc non Af Amer: 58 mL/min — ABNORMAL LOW (ref >90)
Potassium: 3.8 mEq/L (ref 3.5–5.1)

## 2012-04-13 LAB — CBC
HCT: 34.5 % — ABNORMAL LOW (ref 36.0–46.0)
MCHC: 31.6 g/dL (ref 30.0–36.0)
RDW: 14.5 % (ref 11.5–15.5)

## 2012-04-13 MED ORDER — METRONIDAZOLE 500 MG PO TABS
500.0000 mg | ORAL_TABLET | Freq: Three times a day (TID) | ORAL | Status: DC
  Administered 2012-04-14 (×2): 500 mg via ORAL
  Filled 2012-04-13 (×2): qty 1

## 2012-04-13 MED ORDER — SODIUM CHLORIDE 0.9 % IJ SOLN
INTRAMUSCULAR | Status: AC
  Administered 2012-04-13: 5 mL
  Filled 2012-04-13: qty 3

## 2012-04-13 MED ORDER — CIPROFLOXACIN HCL 250 MG PO TABS
500.0000 mg | ORAL_TABLET | Freq: Two times a day (BID) | ORAL | Status: DC
  Administered 2012-04-14: 500 mg via ORAL
  Filled 2012-04-13: qty 2

## 2012-04-13 NOTE — Consult Note (Signed)
Reason for Consult: Sigmoid diverticulitis Referring Physician: Dr. [name] is an 56 y.o. female.  HPI: Patient is [name] 56 year old black female who had [name] similar episode of diverticulitis 3 years ago who presented with left lower quadrant abdominal pain. It was felt that she wasn't tolerating oral antibiotics well and she was admitted the hospital for further evaluation treatment. CT scan the abdomen and pelvis does reveal diverticulitis the proximal sigmoid colon, though there is no evidence of perforation or abscess formation. She was noted yesterday, though she states she feels much better today. She had [name] colonoscopy by Dr. Jena Gauss 3 years ago.  Past Medical History  Diagnosis Date  . Hypertension   . Asthma   . Diverticulitis     Past Surgical History  Procedure Date  . Cholecystectomy   . Ablation colpoclesis     Family History  Problem Relation Age of Onset  . Emphysema Mother   . Stroke Father   . Hypertension Father   . Diabetes Sister   . Hypertension Sister   . Stroke Brother   . Hypertension Brother     Social History:  reports that she has never smoked. She does not have any smokeless tobacco history on file. She reports that she does not drink alcohol or use illicit drugs.  Allergies:  Allergies  Allergen Reactions  . Iohexol Anaphylaxis       . Latex Other (See Comments)    Causes dark spots on skin, but not pain, or irritation.   . Sulfa Antibiotics Rash    Minor Shaking    Medications: I have reviewed the patient's current medications.  Results for orders placed during the hospital encounter of 04/12/12 (from the past 48 hour(s))  CBC     Status: Abnormal   Collection Time   04/12/12 11:15 AM      Component Value Range Comment   WBC 7.0  4.0 - 10.5 (K/uL)    RBC 4.36  3.87 - 5.11 (MIL/uL)    Hemoglobin 11.6 (*) 12.0 - 15.0 (g/dL)    HCT 16.1  09.6 - 04.5 (%)    MCV 83.0  78.0 - 100.0 (fL)    MCH 26.6  26.0 - 34.0 (pg)    MCHC 32.0  30.0 - 36.0 (g/dL)    RDW 40.9  81.1 - 91.4 (%)    Platelets 358  150 - 400 (K/uL)   DIFFERENTIAL     Status: Abnormal   Collection Time   04/12/12 11:15 AM      Component Value Range Comment   Neutrophils Relative 49  43 - 77 (%)    Neutro Abs 3.4  1.7 - 7.7 (K/uL)    Lymphocytes Relative 36  12 - 46 (%)    Lymphs Abs 2.5  0.7 - 4.0 (K/uL)    Monocytes Relative 7  3 - 12 (%)    Monocytes Absolute 0.5  0.1 - 1.0 (K/uL)    Eosinophils Relative 7 (*) 0 - 5 (%)    Eosinophils Absolute 0.5  0.0 - 0.7 (K/uL)    Basophils Relative 1  0 - 1 (%)    Basophils Absolute 0.1  0.0 - 0.1 (K/uL)   COMPREHENSIVE METABOLIC PANEL     Status: Abnormal   Collection Time   04/12/12 11:15 AM      Component Value Range Comment   Sodium 139  135 - 145 (mEq/L)    Potassium 3.4 (*) 3.5 - 5.1 (mEq/L)  Chloride 100  96 - 112 (mEq/L)    CO2 28  19 - 32 (mEq/L)    Glucose, Bld 99  70 - 99 (mg/dL)    BUN 14  6 - 23 (mg/dL)    Creatinine, Ser 4.09  0.50 - 1.10 (mg/dL)    Calcium 81.1 (*) 8.4 - 10.5 (mg/dL)    Total Protein 8.2  6.0 - 8.3 (g/dL)    Albumin 4.1  3.5 - 5.2 (g/dL)    AST 32  0 - 37 (U/L)    ALT 25  0 - 35 (U/L)    Alkaline Phosphatase 150 (*) 39 - 117 (U/L)    Total Bilirubin 0.2 (*) 0.3 - 1.2 (mg/dL)    GFR calc non Af Amer 55 (*) >90 (mL/min)    GFR calc Af Amer 64 (*) >90 (mL/min)   CBC     Status: Abnormal   Collection Time   04/13/12  6:02 AM      Component Value Range Comment   WBC 4.6  4.0 - 10.5 (K/uL)    RBC 4.14  3.87 - 5.11 (MIL/uL)    Hemoglobin 10.9 (*) 12.0 - 15.0 (g/dL)    HCT 91.4 (*) 78.2 - 46.0 (%)    MCV 83.3  78.0 - 100.0 (fL)    MCH 26.3  26.0 - 34.0 (pg)    MCHC 31.6  30.0 - 36.0 (g/dL)    RDW 95.6  21.3 - 08.6 (%)    Platelets 360  150 - 400 (K/uL)   BASIC METABOLIC PANEL     Status: Abnormal   Collection Time   04/13/12  6:02 AM      Component Value Range Comment   Sodium 140  135 - 145 (mEq/L)    Potassium 3.8  3.5 - 5.1 (mEq/L)    Chloride 106   96 - 112 (mEq/L)    CO2 26  19 - 32 (mEq/L)    Glucose, Bld 104 (*) 70 - 99 (mg/dL)    BUN 11  6 - 23 (mg/dL)    Creatinine, Ser 5.78  0.50 - 1.10 (mg/dL)    Calcium 46.9  8.4 - 10.5 (mg/dL)    GFR calc non Af Amer 58 (*) >90 (mL/min)    GFR calc Af Amer 68 (*) >90 (mL/min)     No results found.  ROS: See chart Blood pressure 116/67, pulse 75, temperature 98.3 F (36.8 C), temperature source Oral, resp. rate 18, height 4\' 11"  (1.499 m), weight 89.359 kg (197 lb), SpO2 93.00%. Physical Exam: Obese black female in no acute distress. Abdomen: Soft with tenderness down the left lower quadrant to deep palpation. No hepatosplenomegaly, masses, hernias, or rigidity are noted.  Assessment/Plan:  impression: Recurrent sigmoid diverticulitis. No need for acute surgical intervention at this time. She should get [name] total of 2 weeks of antibiotics. I will be more happy to see her in 6 weeks as an outpatient to see how she has gotten over this acute episode. She may or may not need [name] partial colectomy in the future. This was discussed with the patient. May advance diet as tolerated.  Kathleen [name] 04/13/2012, 11:57 AM

## 2012-04-13 NOTE — Progress Notes (Signed)
[name], [name]               ACCOUNT NO.:  [id number]  MEDICAL RECORD NO.:  [id number]  LOCATION:  A318                          FACILITY:  [facility]  PHYSICIAN:  Mila Homer. Sudie Bailey, M.D.DATE OF BIRTH:  August 26, 1956  DATE OF PROCEDURE: DATE OF DISCHARGE:                                PROGRESS NOTE   SUBJECTIVE:  She feels much better today.  She was admitted yesterday with increasing abdominal pain secondary to diverticulitis with lack of improvement despite outpatient treatment with p.o. antibiotics.  OBJECTIVE:  Temperature is 98.3.  Pulse 75.  Respiratory rate 18.  Blood pressure 116/67.  She is supine in bed.  She is no acute distress, well developed, and obese. HEART:  Has a regular rhythm, rate of about 80 today. LUNGS:  Clear throughout.  She is moving air well. ABDOMEN:  Soft but she does have some point tenderness on palpation in the left lower quadrant.  Yesterday's white cell count on admission was 7000, now 4600.  She had a normal diff and hemoglobin slightly low at 10.9.  Her potassium on admission was 3.4, now is 3.8 on potassium.  ASSESSMENT: 1. Acute diverticulitis. 2. Hypokalemia, resolved. 3. Abnormal CT scan of the abdomen. 4. Benign essential hypertension. 5. Obesity.  PLAN:  I will discontinue the IV today since she is no longer nauseated. We will use a saline lock, get up and around, and try a regular diet. If she is much improved tomorrow, she will go home and otherwise may need to stay an extra day.  We will work up the abnormal CT of the abdomen as an outpatient.     Mila Homer. Sudie Bailey, M.D.     SDK/MEDQ  D:  04/13/2012  T:  04/13/2012  Job:  [id number]

## 2012-04-14 NOTE — Progress Notes (Signed)
Discharge instructions reviewed with patient, patient voiced understanding. Patient given discharge instructions. Patient in stable condition awaiting her ride home.

## 2012-04-14 NOTE — Discharge Summary (Signed)
[name], [name]               ACCOUNT NO.:  [id number]  MEDICAL RECORD NO.:  [id number]  LOCATION:  A318                          FACILITY:  [facility]  PHYSICIAN:  Mila Homer. Sudie Bailey, M.D.DATE OF BIRTH:  [birth year]  DATE OF ADMISSION:  04/12/2012 DATE OF DISCHARGE:  LH                              DISCHARGE SUMMARY   This 56 year old woman was admitted to the hospital with acute diverticulitis which had not responded to outpatient p.o. antibiotics. She had a benign 3 day hospitalization extending from April 12, 2012 to April 14, 2012.  Vital signs remained stable.  Her white cell count the day before admission was 11,800, but that dropped to 7000 then to 4600 during her hospitalization.  Potassium was 3.3, but that improved to 3.4 and 3.8 with supplementation.  Her CT scan, done prior to hospitalization showed an uncomplicated proximal sigmoid diverticulitis and adenopathy of the periaortic retroperitoneum with new celiac axis adenopathy.  She also had hepatic steatosis and some minimal nodularity of the right lung base.  She was admitted to the hospital.  She was put on IV fluids normal saline at 75 mL an hour and given Cipro 400 mg IV q.12 hours and metronidazole 500 mg IV and q.8 hours.  Within a day, she was improved, and by the day of discharge she had minimal if any left lower quadrant abdominal pain, was sitting up in bed eating breakfast, and much improved.  She is discharged home on her outpatient medications, which include: 1. Proventil inhaler 2 puffs q.6 hours for breathing. 2. Hydrochlorothiazide 25 mg daily. 3. Nasonex nasal spray 2 sprays in each nostril daily for allergies. 4. She will also be on promethazine 25 mg half a tablet every 6 hours     for nausea, which she already had. 5. Continue with the Cipro 250 mg, 2 tablets q.12 hours. 6. Metronidazole 500 mg q.12 hours.  Follow up was to be in the office within 1 week.  We discussed acute diverticulitis at  length.  She also saw Dr. Franky Macho, surgeon, while in the hospital to discuss surgical options if this became more of a problem in the future.  FINAL DISCHARGE DIAGNOSES: 1. Acute diverticulitis. 2. Abnormal CT scan of the abdomen. 3. Benign essential hypertension. 4. Morbid obesity. 5. Allergic rhinitis. 6. History of asthma.     Mila Homer. Sudie Bailey, M.D.     SDK/MEDQ  D:  04/14/2012  T:  04/14/2012  Job:  [id number]

## 2012-04-14 NOTE — Progress Notes (Signed)
Patient in stable condition and transported out by this RN.  

## 2012-05-23 ENCOUNTER — Emergency Department (HOSPITAL_COMMUNITY): Payer: No Typology Code available for payment source

## 2012-05-23 ENCOUNTER — Encounter (HOSPITAL_COMMUNITY): Payer: Self-pay | Admitting: *Deleted

## 2012-05-23 ENCOUNTER — Emergency Department (HOSPITAL_COMMUNITY)
Admission: EM | Admit: 2012-05-23 | Discharge: 2012-05-23 | Disposition: A | Discharge status: Home / Self Care | Payer: No Typology Code available for payment source | Attending: Emergency Medicine | Admitting: Emergency Medicine

## 2012-05-23 DIAGNOSIS — R079 Chest pain, unspecified: Secondary | ICD-10-CM | POA: Insufficient documentation

## 2012-05-23 DIAGNOSIS — M25562 Pain in left knee: Secondary | ICD-10-CM

## 2012-05-23 DIAGNOSIS — M25532 Pain in left wrist: Secondary | ICD-10-CM

## 2012-05-23 DIAGNOSIS — M25539 Pain in unspecified wrist: Secondary | ICD-10-CM | POA: Insufficient documentation

## 2012-05-23 DIAGNOSIS — R51 Headache: Secondary | ICD-10-CM | POA: Insufficient documentation

## 2012-05-23 DIAGNOSIS — M549 Dorsalgia, unspecified: Secondary | ICD-10-CM | POA: Insufficient documentation

## 2012-05-23 DIAGNOSIS — M25569 Pain in unspecified knee: Secondary | ICD-10-CM | POA: Insufficient documentation

## 2012-05-23 DIAGNOSIS — S161XXA Strain of muscle, fascia and tendon at neck level, initial encounter: Secondary | ICD-10-CM

## 2012-05-23 DIAGNOSIS — T1490XA Injury, unspecified, initial encounter: Secondary | ICD-10-CM | POA: Insufficient documentation

## 2012-05-23 DIAGNOSIS — I10 Essential (primary) hypertension: Secondary | ICD-10-CM | POA: Insufficient documentation

## 2012-05-23 DIAGNOSIS — M542 Cervicalgia: Secondary | ICD-10-CM | POA: Insufficient documentation

## 2012-05-23 DIAGNOSIS — S139XXA Sprain of joints and ligaments of unspecified parts of neck, initial encounter: Secondary | ICD-10-CM | POA: Insufficient documentation

## 2012-05-23 LAB — CBC
Hemoglobin: 11.4 g/dL — ABNORMAL LOW (ref 12.0–15.0)
MCHC: 32.6 g/dL (ref 30.0–36.0)
Platelets: 297 10*3/uL (ref 150–400)
RDW: 15 % (ref 11.5–15.5)

## 2012-05-23 LAB — BASIC METABOLIC PANEL
GFR calc Af Amer: 47 mL/min — ABNORMAL LOW (ref >90)
GFR calc non Af Amer: 40 mL/min — ABNORMAL LOW (ref >90)
Glucose, Bld: 94 mg/dL (ref 70–99)
Potassium: 3.4 mEq/L — ABNORMAL LOW (ref 3.5–5.1)
Sodium: 142 mEq/L (ref 135–145)

## 2012-05-23 MED ORDER — OXYCODONE-ACETAMINOPHEN 5-325 MG PO TABS
1.0000 | ORAL_TABLET | Freq: Once | ORAL | Status: AC
  Administered 2012-05-23: 1 via ORAL
  Filled 2012-05-23: qty 1

## 2012-05-23 MED ORDER — MORPHINE SULFATE 4 MG/ML IJ SOLN
8.0000 mg | Freq: Once | INTRAMUSCULAR | Status: AC
  Administered 2012-05-23: 8 mg via INTRAVENOUS
  Filled 2012-05-23: qty 2

## 2012-05-23 MED ORDER — SODIUM CHLORIDE 0.9 % IV SOLN
Freq: Once | INTRAVENOUS | Status: AC
  Administered 2012-05-23: 16:00:00 via INTRAVENOUS

## 2012-05-23 MED ORDER — OXYCODONE-ACETAMINOPHEN 5-325 MG PO TABS: 1.0000 | ORAL_TABLET | ORAL | Status: DC | PRN
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
## 2012-05-23 NOTE — ED Notes (Signed)
Patient transported to X-ray 
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
## 2012-05-30 ENCOUNTER — Encounter (HOSPITAL_COMMUNITY): Payer: Self-pay

## 2012-05-30 ENCOUNTER — Emergency Department (HOSPITAL_COMMUNITY)
Admission: EM | Admit: 2012-05-30 | Discharge: 2012-05-30 | Disposition: A | Discharge status: Home / Self Care | Payer: No Typology Code available for payment source | Attending: Emergency Medicine | Admitting: Emergency Medicine

## 2012-05-30 ENCOUNTER — Emergency Department (HOSPITAL_COMMUNITY): Payer: No Typology Code available for payment source

## 2012-05-30 DIAGNOSIS — S20219A Contusion of unspecified front wall of thorax, initial encounter: Secondary | ICD-10-CM

## 2012-05-30 DIAGNOSIS — Z79899 Other long term (current) drug therapy: Secondary | ICD-10-CM | POA: Insufficient documentation

## 2012-05-30 DIAGNOSIS — J45909 Unspecified asthma, uncomplicated: Secondary | ICD-10-CM | POA: Insufficient documentation

## 2012-05-30 DIAGNOSIS — IMO0002 Reserved for concepts with insufficient information to code with codable children: Secondary | ICD-10-CM | POA: Insufficient documentation

## 2012-05-30 DIAGNOSIS — S8390XA Sprain of unspecified site of unspecified knee, initial encounter: Secondary | ICD-10-CM

## 2012-05-30 DIAGNOSIS — Y92410 Unspecified street and highway as the place of occurrence of the external cause: Secondary | ICD-10-CM | POA: Insufficient documentation

## 2012-05-30 DIAGNOSIS — I10 Essential (primary) hypertension: Secondary | ICD-10-CM | POA: Insufficient documentation

## 2012-05-30 DIAGNOSIS — S301XXA Contusion of abdominal wall, initial encounter: Secondary | ICD-10-CM | POA: Insufficient documentation

## 2012-05-30 MED ORDER — OXYCODONE-ACETAMINOPHEN 5-325 MG PO TABS: 0.5000 | ORAL_TABLET | Freq: Four times a day (QID) | ORAL | Status: AC | PRN
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
## 2012-06-07 ENCOUNTER — Emergency Department (HOSPITAL_COMMUNITY)
Admission: EM | Admit: 2012-06-07 | Discharge: 2012-06-07 | Disposition: A | Discharge status: Home / Self Care | Payer: No Typology Code available for payment source | Attending: Emergency Medicine | Admitting: Emergency Medicine

## 2012-06-07 ENCOUNTER — Encounter (HOSPITAL_COMMUNITY): Payer: Self-pay

## 2012-06-07 DIAGNOSIS — J45909 Unspecified asthma, uncomplicated: Secondary | ICD-10-CM | POA: Insufficient documentation

## 2012-06-07 DIAGNOSIS — I10 Essential (primary) hypertension: Secondary | ICD-10-CM | POA: Insufficient documentation

## 2012-06-07 DIAGNOSIS — M25569 Pain in unspecified knee: Secondary | ICD-10-CM

## 2012-06-07 DIAGNOSIS — M25469 Effusion, unspecified knee: Secondary | ICD-10-CM | POA: Insufficient documentation

## 2012-06-07 MED ORDER — OXYCODONE-ACETAMINOPHEN 5-325 MG PO TABS: 1.0000 | ORAL_TABLET | ORAL | Status: AC | PRN
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
## 2012-06-07 NOTE — Discharge Instructions (Signed)
Knee Pain The knee is the complex joint between your thigh and your lower leg. It is made up of bones, tendons, ligaments, and cartilage. The bones that make up the knee are:  The femur in the thigh.   The tibia and fibula in the lower leg.   The patella or kneecap riding in the groove on the lower femur.  CAUSES  Knee pain is a common complaint with many causes. A few of these causes are:  Injury, such as:   A ruptured ligament or tendon injury.   Torn cartilage.   Medical conditions, such as:   Gout   Arthritis   Infections   Overuse, over training or overdoing a physical activity.  Knee pain can be minor or severe. Knee pain can accompany debilitating injury. Minor knee problems often respond well to self-care measures or get well on their own. More serious injuries may need medical intervention or even surgery. SYMPTOMS The knee is complex. Symptoms of knee problems can vary widely. Some of the problems are:  Pain with movement and weight bearing.   Swelling and tenderness.   Buckling of the knee.   Inability to straighten or extend your knee.   Your knee locks and you cannot straighten it.   Warmth and redness with pain and fever.   Deformity or dislocation of the kneecap.  DIAGNOSIS  Determining what is wrong may be very straight forward such as when there is an injury. It can also be challenging because of the complexity of the knee. Tests to make a diagnosis may include:  Your caregiver taking a history and doing a physical exam.   Routine X-rays can be used to rule out other problems. X-rays will not reveal a cartilage tear. Some injuries of the knee can be diagnosed by:   Arthroscopy a surgical technique by which a small video camera is inserted through tiny incisions on the sides of the knee. This procedure is used to examine and repair internal knee joint problems. Tiny instruments can be used during arthroscopy to repair the torn knee cartilage  (meniscus).   Arthrography is a radiology technique. A contrast liquid is directly injected into the knee joint. Internal structures of the knee joint then become visible on X-ray film.   An MRI scan is a non x-ray radiology procedure in which magnetic fields and a computer produce two- or three-dimensional images of the inside of the knee. Cartilage tears are often visible using an MRI scanner. MRI scans have largely replaced arthrography in diagnosing cartilage tears of the knee.   Blood work.   Examination of the fluid that helps to lubricate the knee joint (synovial fluid). This is done by taking a sample out using a needle and a syringe.  TREATMENT The treatment of knee problems depends on the cause. Some of these treatments are:  Depending on the injury, proper casting, splinting, surgery or physical therapy care will be needed.   Give yourself adequate recovery time. Do not overuse your joints. If you begin to get sore during workout routines, back off. Slow down or do fewer repetitions.   For repetitive activities such as cycling or running, maintain your strength and nutrition.   Alternate muscle groups. For example if you are a weight lifter, work the upper body on one day and the lower body the next.   Either tight or weak muscles do not give the proper support for your knee. Tight or weak muscles do not absorb the stress placed   on the knee joint. Keep the muscles surrounding the knee strong.   Take care of mechanical problems.   If you have flat feet, orthotics or special shoes may help. See your caregiver if you need help.   Arch supports, sometimes with wedges on the inner or outer aspect of the heel, can help. These can shift pressure away from the side of the knee most bothered by osteoarthritis.   A brace called an "unloader" brace also may be used to help ease the pressure on the most arthritic side of the knee.   If your caregiver has prescribed crutches, braces,  wraps or ice, use as directed. The acronym for this is PRICE. This means protection, rest, ice, compression and elevation.   Nonsteroidal anti-inflammatory drugs (NSAID's), can help relieve pain. But if taken immediately after an injury, they may actually increase swelling. Take NSAID's with food in your stomach. Stop them if you develop stomach problems. Do not take these if you have a history of ulcers, stomach pain or bleeding from the bowel. Do not take without your caregiver's approval if you have problems with fluid retention, heart failure, or kidney problems.   For ongoing knee problems, physical therapy may be helpful.   Glucosamine and chondroitin are over-the-counter dietary supplements. Both may help relieve the pain of osteoarthritis in the knee. These medicines are different from the usual anti-inflammatory drugs. Glucosamine may decrease the rate of cartilage destruction.   Injections of a corticosteroid drug into your knee joint may help reduce the symptoms of an arthritis flare-up. They may provide pain relief that lasts a few months. You may have to wait a few months between injections. The injections do have a small increased risk of infection, water retention and elevated blood sugar levels.   Hyaluronic acid injected into damaged joints may ease pain and provide lubrication. These injections may work by reducing inflammation. A series of shots may give relief for as long as 6 months.   Topical painkillers. Applying certain ointments to your skin may help relieve the pain and stiffness of osteoarthritis. Ask your pharmacist for suggestions. Many over the-counter products are approved for temporary relief of arthritis pain.   In some countries, doctors often prescribe topical NSAID's for relief of chronic conditions such as arthritis and tendinitis. A review of treatment with NSAID creams found that they worked as well as oral medications but without the serious side effects.    PREVENTION  Maintain a healthy weight. Extra pounds put more strain on your joints.   Get strong, stay limber. Weak muscles are a common cause of knee injuries. Stretching is important. Include flexibility exercises in your workouts.   Be smart about exercise. If you have osteoarthritis, chronic knee pain or recurring injuries, you may need to change the way you exercise. This does not mean you have to stop being active. If your knees ache after jogging or playing basketball, consider switching to swimming, water aerobics or other low-impact activities, at least for a few days a week. Sometimes limiting high-impact activities will provide relief.   Make sure your shoes fit well. Choose footwear that is right for your sport.   Protect your knees. Use the proper gear for knee-sensitive activities. Use kneepads when playing volleyball or laying carpet. Buckle your seat belt every time you drive. Most shattered kneecaps occur in car accidents.   Rest when you are tired.  SEEK MEDICAL CARE IF:  You have knee pain that is continual and does not   seem to be getting better.  SEEK IMMEDIATE MEDICAL CARE IF:  Your knee joint feels hot to the touch and you have a high fever. MAKE SURE YOU:   Understand these instructions.   Will watch your condition.   Will get help right away if you are not doing well or get worse.  Document Released: 10/04/2007 Document Revised: 11/26/2011 Document Reviewed: 10/04/2007 [employer] Patient Information 2012 [employer]. 

## 2012-06-13 NOTE — Progress Notes (Signed)
UR chart review completed.  

## 2012-06-14 NOTE — ED Provider Notes (Signed)
Medical screening examination/treatment/procedure(s) were performed by non-physician practitioner and as supervising physician I was immediately available for consultation/collaboration.  Shelda Jakes, MD 06/14/12 [phone]

## 2012-12-26 ENCOUNTER — Ambulatory Visit (HOSPITAL_COMMUNITY)
Admission: RE | Admit: 2012-12-26 | Discharge: 2012-12-26 | Disposition: A | Discharge status: Home / Self Care | Payer: No Typology Code available for payment source | Source: Ambulatory Visit | Attending: Family Medicine | Admitting: Family Medicine

## 2012-12-26 ENCOUNTER — Other Ambulatory Visit (HOSPITAL_COMMUNITY): Payer: Self-pay | Admitting: Family Medicine

## 2012-12-26 ENCOUNTER — Encounter (HOSPITAL_COMMUNITY): Payer: Self-pay

## 2012-12-26 DIAGNOSIS — M545 Low back pain, unspecified: Secondary | ICD-10-CM | POA: Insufficient documentation

## 2012-12-26 DIAGNOSIS — R109 Unspecified abdominal pain: Secondary | ICD-10-CM | POA: Insufficient documentation

## 2012-12-26 DIAGNOSIS — R0602 Shortness of breath: Secondary | ICD-10-CM | POA: Insufficient documentation

## 2012-12-26 DIAGNOSIS — M25559 Pain in unspecified hip: Secondary | ICD-10-CM | POA: Insufficient documentation

## 2013-02-16 ENCOUNTER — Emergency Department (HOSPITAL_COMMUNITY)
Admission: EM | Admit: 2013-02-16 | Discharge: 2013-02-16 | Disposition: A | Discharge status: Home / Self Care | Payer: No Typology Code available for payment source | Attending: Emergency Medicine | Admitting: Emergency Medicine

## 2013-02-16 ENCOUNTER — Encounter (HOSPITAL_COMMUNITY): Payer: Self-pay | Admitting: *Deleted

## 2013-02-16 ENCOUNTER — Emergency Department (HOSPITAL_COMMUNITY): Payer: No Typology Code available for payment source

## 2013-02-16 DIAGNOSIS — R300 Dysuria: Secondary | ICD-10-CM | POA: Insufficient documentation

## 2013-02-16 DIAGNOSIS — I10 Essential (primary) hypertension: Secondary | ICD-10-CM | POA: Insufficient documentation

## 2013-02-16 DIAGNOSIS — E876 Hypokalemia: Secondary | ICD-10-CM | POA: Insufficient documentation

## 2013-02-16 DIAGNOSIS — Z79899 Other long term (current) drug therapy: Secondary | ICD-10-CM | POA: Insufficient documentation

## 2013-02-16 DIAGNOSIS — R3915 Urgency of urination: Secondary | ICD-10-CM | POA: Insufficient documentation

## 2013-02-16 DIAGNOSIS — Z8719 Personal history of other diseases of the digestive system: Secondary | ICD-10-CM | POA: Insufficient documentation

## 2013-02-16 DIAGNOSIS — R110 Nausea: Secondary | ICD-10-CM | POA: Insufficient documentation

## 2013-02-16 DIAGNOSIS — R109 Unspecified abdominal pain: Secondary | ICD-10-CM | POA: Insufficient documentation

## 2013-02-16 DIAGNOSIS — R350 Frequency of micturition: Secondary | ICD-10-CM | POA: Insufficient documentation

## 2013-02-16 DIAGNOSIS — J45909 Unspecified asthma, uncomplicated: Secondary | ICD-10-CM | POA: Insufficient documentation

## 2013-02-16 LAB — CBC WITH DIFFERENTIAL/PLATELET
Basophils Absolute: 0.1 10*3/uL (ref 0.0–0.1)
Basophils Relative: 1 % (ref 0–1)
Eosinophils Relative: 6 % — ABNORMAL HIGH (ref 0–5)
HCT: 38.4 % (ref 36.0–46.0)
MCHC: 32.8 g/dL (ref 30.0–36.0)
Monocytes Absolute: 0.5 10*3/uL (ref 0.1–1.0)
Neutro Abs: 3.8 10*3/uL (ref 1.7–7.7)
RDW: 14.8 % (ref 11.5–15.5)

## 2013-02-16 LAB — URINALYSIS, ROUTINE W REFLEX MICROSCOPIC
Glucose, UA: NEGATIVE mg/dL
Ketones, ur: NEGATIVE mg/dL
Leukocytes, UA: NEGATIVE
Protein, ur: NEGATIVE mg/dL
Urobilinogen, UA: 0.2 mg/dL (ref 0.0–1.0)

## 2013-02-16 LAB — COMPREHENSIVE METABOLIC PANEL
AST: 26 U/L (ref 0–37)
Albumin: 4.2 g/dL (ref 3.5–5.2)
Calcium: 11 mg/dL — ABNORMAL HIGH (ref 8.4–10.5)
Creatinine, Ser: 0.99 mg/dL (ref 0.50–1.10)

## 2013-02-16 MED ORDER — MORPHINE SULFATE 4 MG/ML IJ SOLN
4.0000 mg | Freq: Once | INTRAMUSCULAR | Status: AC
  Administered 2013-02-16: 4 mg via INTRAVENOUS
  Filled 2013-02-16: qty 1

## 2013-02-16 MED ORDER — ONDANSETRON HCL 4 MG/2ML IJ SOLN
4.0000 mg | Freq: Once | INTRAMUSCULAR | Status: AC
  Administered 2013-02-16: 4 mg via INTRAVENOUS
  Filled 2013-02-16: qty 2

## 2013-02-16 MED ORDER — ONDANSETRON HCL 4 MG PO TABS: 8.0000 mg | ORAL_TABLET | Freq: Three times a day (TID) | ORAL | Status: DC | PRN

## 2013-02-16 MED ORDER — SODIUM CHLORIDE 0.9 % IV BOLUS (SEPSIS)
500.0000 mL | Freq: Once | INTRAVENOUS | Status: AC
  Administered 2013-02-16: 500 mL via INTRAVENOUS

## 2013-02-16 MED ORDER — POTASSIUM CHLORIDE CRYS ER 20 MEQ PO TBCR
20.0000 meq | EXTENDED_RELEASE_TABLET | Freq: Once | ORAL | Status: AC
  Administered 2013-02-16: 20 meq via ORAL
  Filled 2013-02-16: qty 1

## 2013-02-16 MED ORDER — HYDROCODONE-ACETAMINOPHEN 5-325 MG PO TABS: 1.0000 | ORAL_TABLET | ORAL | Status: DC | PRN

## 2013-02-16 NOTE — ED Notes (Signed)
Pain rt flank since Sunday,  Sl nausea, no vomiting,  Dysuria

## 2013-02-16 NOTE — ED Notes (Signed)
Patient with no complaints at this time. Respirations even and unlabored. Skin warm/dry. Discharge instructions reviewed with patient at this time. Patient given opportunity to voice concerns/ask questions. IV removed per policy and band-aid applied to site. Patient discharged at this time and left Emergency Department with steady gait.  

## 2013-02-16 NOTE — ED Notes (Signed)
Julie-PA at bedside for evaluation. 

## 2013-02-18 NOTE — ED Provider Notes (Signed)
History     CSN: [id number]  Arrival date & time 02/16/13  1038   First MD Initiated Contact with Patient 02/16/13 1217      Chief Complaint  Patient presents with  . Flank Pain    (Consider location/radiation/quality/duration/timing/severity/associated sxs/prior treatment) HPI Comments: [name] is a 57 y.o. Female presenting with right flank pain which is described as aching in quality,  Constant,  But worse with movement, palpation and deep inspiration.  She denies shortness of breath and denies chest pain.  She does have mild nausea without emesis and does have increased urinary frequency and urgency. She has had no fevers, chills, hematuria, rash, swelling , diarrhea or constipation.  She denies a history of kidney stone or infection. She does have a history of diverticulitis which does resemble her current symptoms, except for the location, previously being the right lower quadrant.  She has taken ibuprofen without relief of symptoms.     The history is provided by the patient.    Past Medical History  Diagnosis Date  . Hypertension   . Asthma   . Diverticulitis     Past Surgical History  Procedure Laterality Date  . Cholecystectomy    . Ablation colpoclesis      Family History  Problem Relation Age of Onset  . Emphysema Mother   . Stroke Father   . Hypertension Father   . Diabetes Sister   . Hypertension Sister   . Stroke Brother   . Hypertension Brother     History  Substance Use Topics  . Smoking status: Never Smoker   . Smokeless tobacco: Not on file  . Alcohol Use: No    OB History   Grav Para Term Preterm Abortions TAB SAB Ect Mult Living   3 2 2  1  1          Review of Systems  Constitutional: Negative for fever and chills.  HENT: Negative for congestion, sore throat and neck pain.   Eyes: Negative.   Respiratory: Negative for chest tightness and shortness of breath.   Cardiovascular: Negative for chest pain.  Gastrointestinal:  Positive for nausea and abdominal pain. Negative for vomiting, diarrhea, constipation, abdominal distention and rectal pain.  Genitourinary: Positive for dysuria. Negative for frequency and hematuria.  Musculoskeletal: Negative for joint swelling and arthralgias.  Skin: Negative.  Negative for rash and wound.  Neurological: Negative for dizziness, weakness, light-headedness, numbness and headaches.  Psychiatric/Behavioral: Negative.     Allergies  Iohexol; Latex; and Sulfa antibiotics  Home Medications   Current Outpatient Rx  Name  Route  Sig  Dispense  Refill  . albuterol (PROVENTIL HFA;VENTOLIN HFA) 108 (90 BASE) MCG/ACT inhaler   Inhalation   Inhale 2 puffs into the lungs every 6 (six) hours as needed. Asthma         . hydrochlorothiazide (HYDRODIURIL) 25 MG tablet   Oral   Take 25 mg by mouth daily.         Marland Kitchen ibuprofen (ADVIL,MOTRIN) 200 MG tablet   Oral   Take 200 mg by mouth every 6 (six) hours as needed for pain.         . Multiple Vitamins-Minerals (ONE-A-DAY ENERGY PO)   Oral   Take 1 tablet by mouth daily.         Marland Kitchen HYDROcodone-acetaminophen (NORCO/VICODIN) 5-325 MG per tablet   Oral   Take 1 tablet by mouth every 4 (four) hours as needed for pain.   20 tablet  0   . ondansetron (ZOFRAN) 4 MG tablet   Oral   Take 2 tablets (8 mg total) by mouth every 8 (eight) hours as needed for nausea.   12 tablet   0     BP 145/73  Pulse 85  Temp(Src) 97.9 F (36.6 C) (Oral)  Resp 20  Ht 5\' 4"  (1.626 m)  Wt 180 lb (81.647 kg)  BMI 30.88 kg/m2  SpO2 97%  Physical Exam  Nursing note and vitals reviewed. Constitutional: She appears well-developed and well-nourished.  HENT:  Head: Normocephalic and atraumatic.  Eyes: Conjunctivae are normal.  Neck: Normal range of motion.  Cardiovascular: Normal rate, regular rhythm, normal heart sounds and intact distal pulses.   Pulmonary/Chest: Effort normal and breath sounds normal. She has no wheezes.  Abdominal:  Soft. Bowel sounds are normal. She exhibits no distension and no mass. There is tenderness in the right upper quadrant. There is no rebound and no guarding.  TTP right lateral flank and RUQ.  Musculoskeletal: Normal range of motion.  Neurological: She is alert.  Skin: Skin is warm and dry.  Psychiatric: She has a normal mood and affect.    ED Course  Procedures (including critical care time)  Labs Reviewed  CBC WITH DIFFERENTIAL - Abnormal; Notable for the following:    Eosinophils Relative 6 (*)    All other components within normal limits  COMPREHENSIVE METABOLIC PANEL - Abnormal; Notable for the following:    Potassium 3.3 (*)    Calcium 11.0 (*)    Alkaline Phosphatase 177 (*)    GFR calc non Af Amer 62 (*)    GFR calc Af Amer 71 (*)    All other components within normal limits  URINALYSIS, ROUTINE W REFLEX MICROSCOPIC   No results found.  Results for orders placed during the hospital encounter of 02/16/13  URINALYSIS, ROUTINE W REFLEX MICROSCOPIC      Result Value Range   Color, Urine YELLOW  YELLOW   APPearance CLEAR  CLEAR   Specific Gravity, Urine 1.025  1.005 - 1.030   pH 6.0  5.0 - 8.0   Glucose, UA NEGATIVE  NEGATIVE mg/dL   Hgb urine dipstick NEGATIVE  NEGATIVE   Bilirubin Urine NEGATIVE  NEGATIVE   Ketones, ur NEGATIVE  NEGATIVE mg/dL   Protein, ur NEGATIVE  NEGATIVE mg/dL   Urobilinogen, UA 0.2  0.0 - 1.0 mg/dL   Nitrite NEGATIVE  NEGATIVE   Leukocytes, UA NEGATIVE  NEGATIVE  CBC WITH DIFFERENTIAL      Result Value Range   WBC 7.7  4.0 - 10.5 K/uL   RBC 4.65  3.87 - 5.11 MIL/uL   Hemoglobin 12.6  12.0 - 15.0 g/dL   HCT 60.4  54.0 - 98.1 %   MCV 82.6  78.0 - 100.0 fL   MCH 27.1  26.0 - 34.0 pg   MCHC 32.8  30.0 - 36.0 g/dL   RDW 19.1  47.8 - 29.5 %   Platelets 300  150 - 400 K/uL   Neutrophils Relative 49  43 - 77 %   Neutro Abs 3.8  1.7 - 7.7 K/uL   Lymphocytes Relative 37  12 - 46 %   Lymphs Abs 2.9  0.7 - 4.0 K/uL   Monocytes Relative 7  3 - 12  %   Monocytes Absolute 0.5  0.1 - 1.0 K/uL   Eosinophils Relative 6 (*) 0 - 5 %   Eosinophils Absolute 0.5  0.0 - 0.7 K/uL  Basophils Relative 1  0 - 1 %   Basophils Absolute 0.1  0.0 - 0.1 K/uL  COMPREHENSIVE METABOLIC PANEL      Result Value Range   Sodium 141  135 - 145 mEq/L   Potassium 3.3 (*) 3.5 - 5.1 mEq/L   Chloride 103  96 - 112 mEq/L   CO2 27  19 - 32 mEq/L   Glucose, Bld 92  70 - 99 mg/dL   BUN 16  6 - 23 mg/dL   Creatinine, Ser 1.91  0.50 - 1.10 mg/dL   Calcium 47.8 (*) 8.4 - 10.5 mg/dL   Total Protein 7.8  6.0 - 8.3 g/dL   Albumin 4.2  3.5 - 5.2 g/dL   AST 26  0 - 37 U/L   ALT 20  0 - 35 U/L   Alkaline Phosphatase 177 (*) 39 - 117 U/L   Total Bilirubin 0.3  0.3 - 1.2 mg/dL   GFR calc non Af Amer 62 (*) >90 mL/min   GFR calc Af Amer 71 (*) >90 mL/min      1. Right flank pain   2. Nausea   3. hypokalemia    MDM  Patients labs and/or radiological studies were reviewed during the medical decision making and disposition process. Pt with unclear source of right flank and RUQ pain with no findings per labs or CT abd/pelvis.  No evidence of this being a kidney stone, cholecytitis,  No active diverticulitis.  With worsened pain with ROM and palpation,  Suspect possible body wall source (musculoskeletal). Will treat with hydrocodone, zofran for persistent nausea.  Pt encouraged recheck by pcp tomorrow if sx persist or worsen,  Also encouraged recheck here prn.  The patient appears reasonably screened and/or stabilized for discharge and I doubt any other medical condition or other [facility] requiring further screening, evaluation, or treatment in the ED at this time prior to discharge.  Pt was seen by Dr. Estell Harpin who recommended Ct abd/pelvis prior to dispo.        Burgess Amor, PA 02/18/13 1705

## 2013-02-19 NOTE — ED Provider Notes (Signed)
Medical screening examination/treatment/procedure(s) were performed by non-physician practitioner and as supervising physician I was immediately available for consultation/collaboration.   Keonta Monceaux L Dala Breault, MD 02/19/13 0707 

## 2013-06-11 IMAGING — CT CT ABD-PELV W/O CM
2 of 4 series · 16 of 46 positions shown, 18 images · non-contrast
Comparison: 07/15/2011

CLINICAL DATA: Left-sided pain.  Rule out diverticulitis.  Contrast
allergy.

CT ABDOMEN AND PELVIS WITHOUT CONTRAST
TECHNIQUE: Multidetector CT imaging of the abdomen and pelvis was
performed following the standard protocol without intravenous
contrast.

[Series 2: abdomen/pelvis w/o contrast · axial · non-contrast · 0.66mm/px · z∈[-436,-30]mm · 13 of 95 slices shown, 15 images]
[im 7/95  soft-tissue]
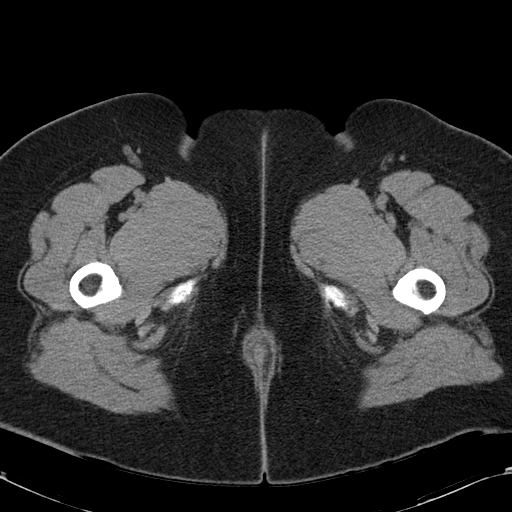
[im 7/95  bone]
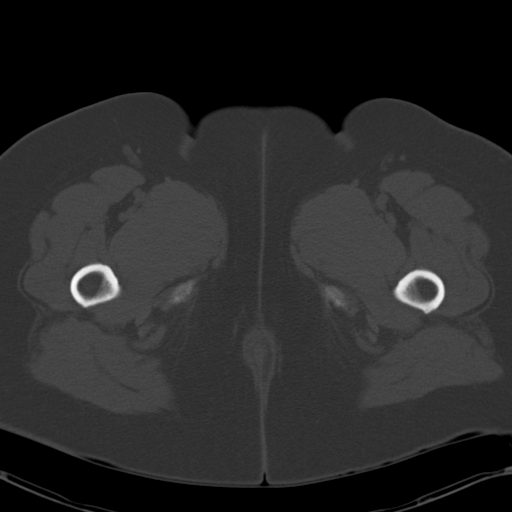
[im 13/95  soft-tissue]
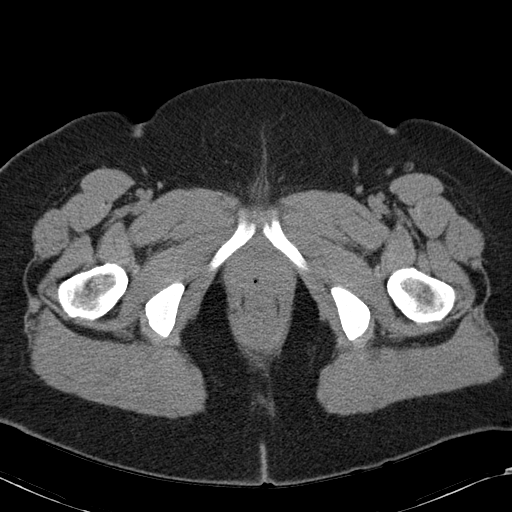
[im 19/95  soft-tissue]
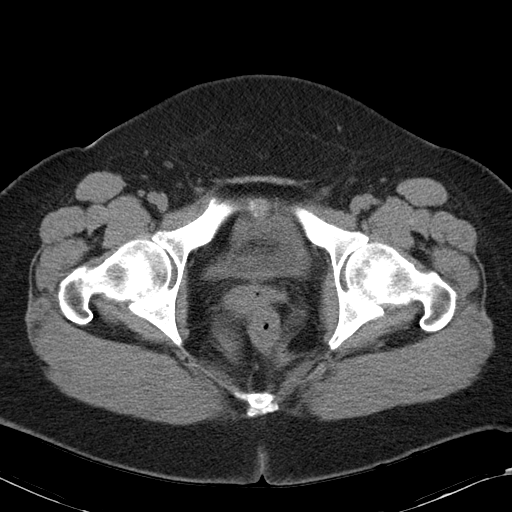
[im 26/95  soft-tissue]
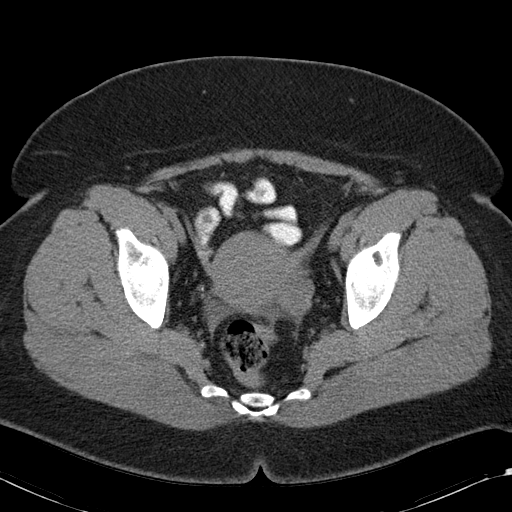
[im 32/95  soft-tissue]
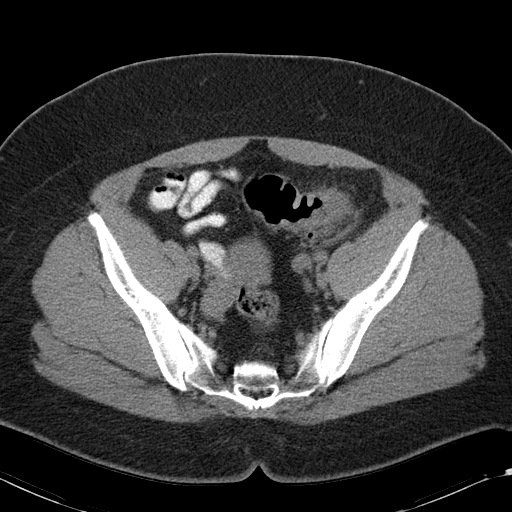
[im 38/95  soft-tissue]
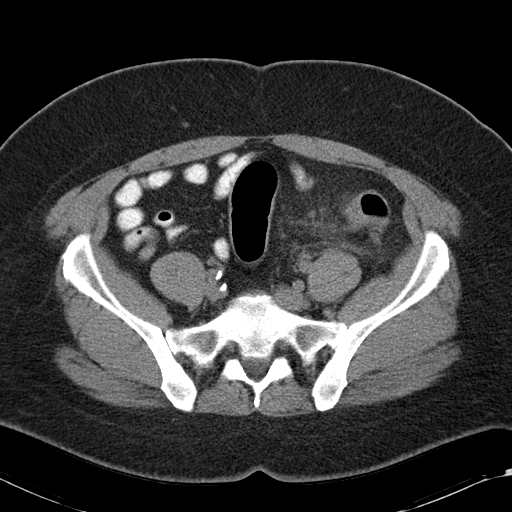
[im 51/95  soft-tissue]
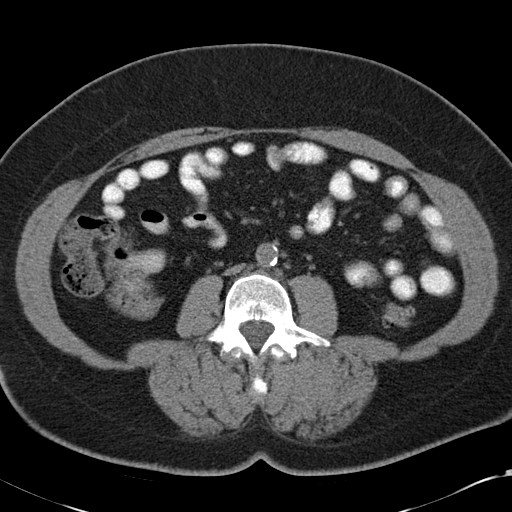
[im 57/95  soft-tissue]
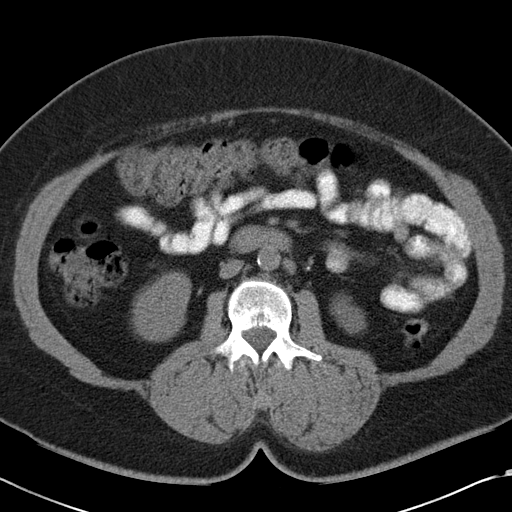
[im 63/95  soft-tissue]
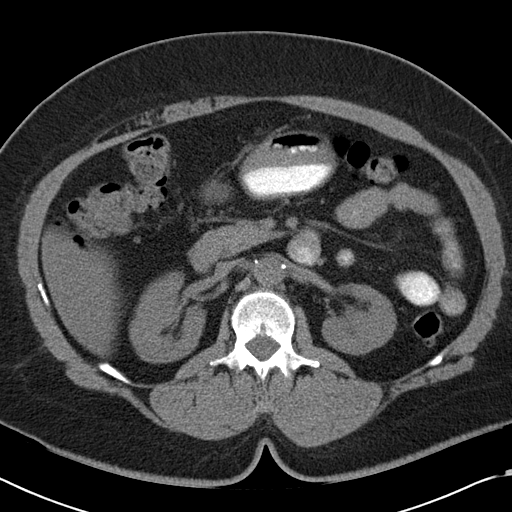
[im 63/95  bone]
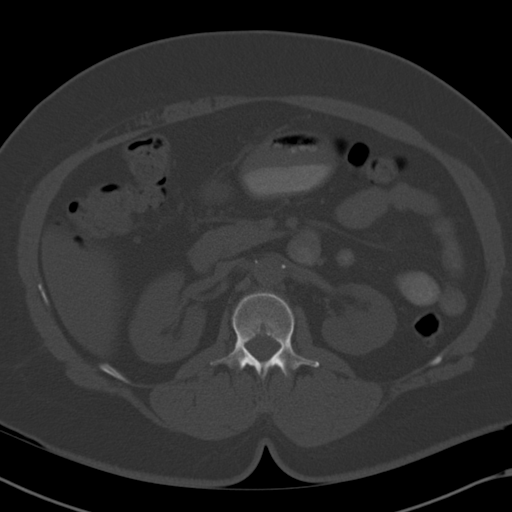
[im 69/95  soft-tissue]
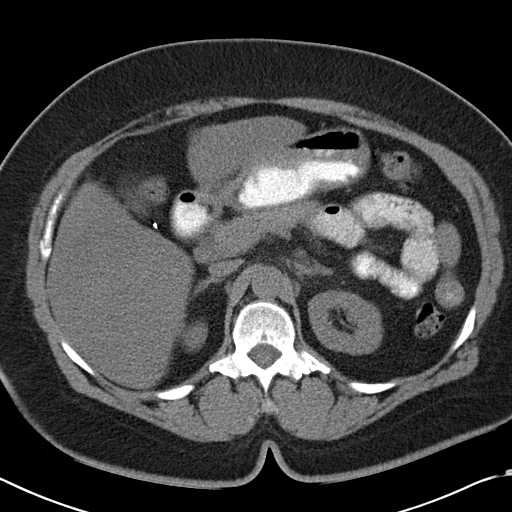
[im 76/95  soft-tissue]
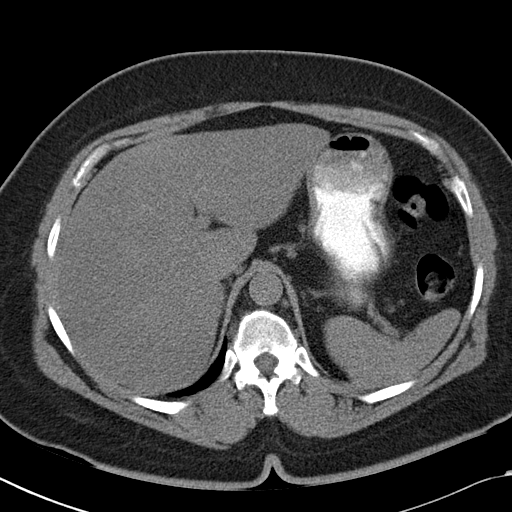
[im 82/95  soft-tissue]
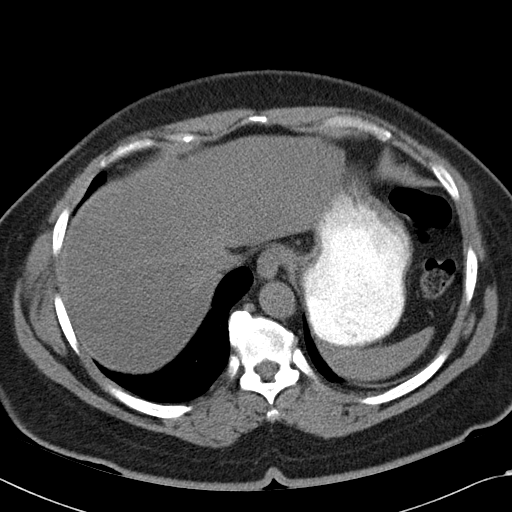
[im 88/95  soft-tissue]
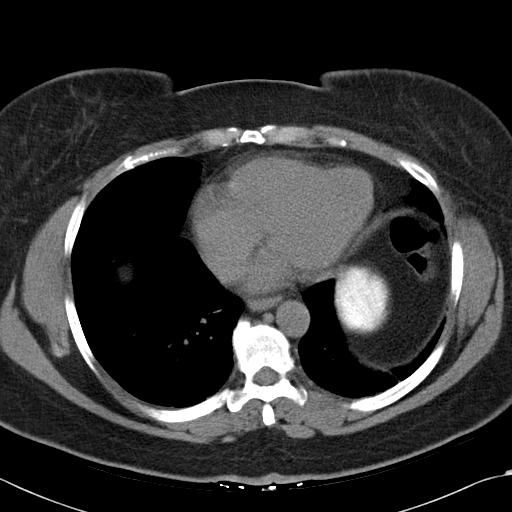

[Series 4: mpr cor (id) · coronal · 0.93mm/px · 3 of 81 slices shown]
[im 27/81  soft-tissue]
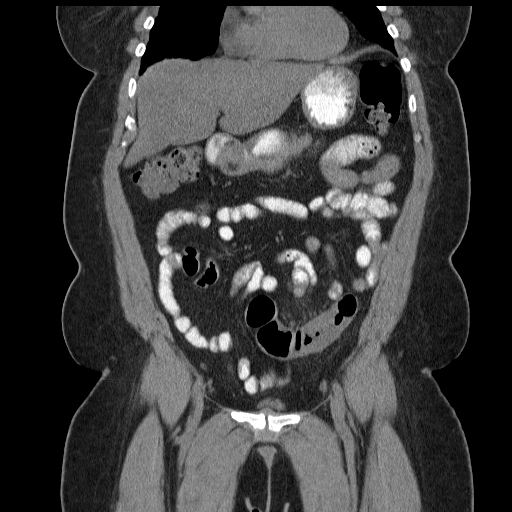
[im 36/81  soft-tissue]
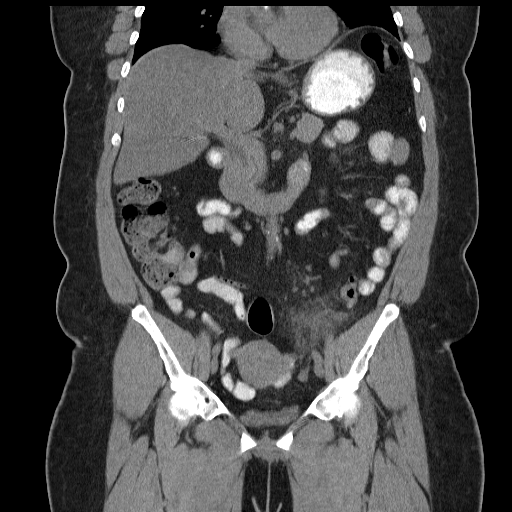
[im 45/81  soft-tissue]
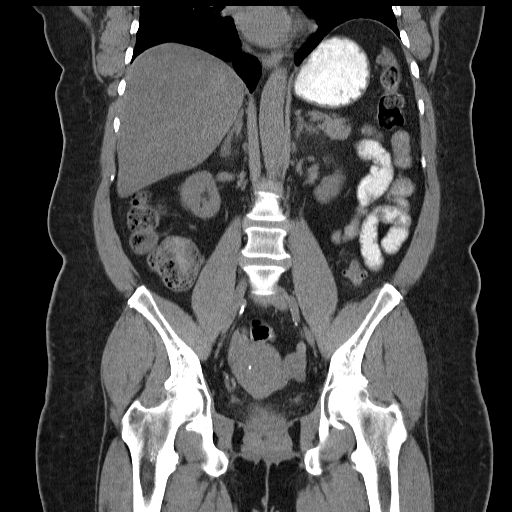

[16 of 46 positions shown; findings below may reference images not displayed]

FINDINGS: Minimal nodularity at the right lung base on image 4.
Approximately 3 mm. Not definitely present on the prior.  Minimal
bibasilar dependent atelectasis.  Mild cardiomegaly, without
pericardial or pleural effusion.

Moderate hepatic steatosis.  Normal spleen, stomach, pancreas.
Cholecystectomy without biliary ductal dilatation.  Normal adrenal
glands and kidneys.

A left periaortic node measures 1.5 cm on image 41 and is unchanged
since the prior exam.  There is also a low density lesion, likely
representing a node, adjacent the left adrenal gland and at the
celiac axis.  This measures 1.3 cm on image 29 and is new or
enlarged since the prior.

Sigmoid diverticulosis.  Focal wall thickening and edema at the
proximal descending colon.  Example image 68.  Scattered
diverticula throughout remainder of the colon. Colonic stool burden
suggests constipation.

Normal terminal ileum and appendix.  Normal small bowel without
abdominal ascites.

  No pelvic adenopathy.  Normal urinary bladder.  Prior tubal
ligation.  Nonspecific calcification within the uterus.  Normal
adnexa with trace cul-de-sac fluid, likely secondary.  No free
perforation or diverticular abscess.  Facet arthropathy in the
lower lumbar spine. No acute osseous abnormality.
IMPRESSION: 1.  Uncomplicated proximal sigmoid diverticulitis.
2.  Similar left periaortic retroperitoneal adenopathy.  New celiac
axis adenopathy with suggestion of hypoattenuation / necrosis.
Appearance is nonspecific.  Although these nodes could be reactive,
a lymphoproliferative process or metastasis cannot be excluded.
Correlate with any history of primary abdominal malignancy.
Consider further characterization with PET.
3.  Hepatic steatosis.
4.  Minimal nodularity at right lung base.  Given risk factors for
bronchogenic carcinoma, follow-up chest CT at 1 year is
recommended.  This recommendation follows the consensus statement:
"Guidelines for Management of Small Pulmonary Nodules Detected on
CT Scans:  A Statement from the [HOSPITAL]" as published in
[URL]

## 2013-07-19 ENCOUNTER — Other Ambulatory Visit (HOSPITAL_COMMUNITY): Payer: Self-pay | Admitting: Family Medicine

## 2013-07-19 DIAGNOSIS — Z139 Encounter for screening, unspecified: Secondary | ICD-10-CM

## 2013-07-31 ENCOUNTER — Ambulatory Visit (HOSPITAL_COMMUNITY)
Admission: RE | Admit: 2013-07-31 | Discharge: 2013-07-31 | Disposition: A | Discharge status: Home / Self Care | Payer: No Typology Code available for payment source | Source: Ambulatory Visit | Attending: Family Medicine | Admitting: Family Medicine

## 2013-07-31 DIAGNOSIS — Z1231 Encounter for screening mammogram for malignant neoplasm of breast: Secondary | ICD-10-CM | POA: Insufficient documentation

## 2013-07-31 DIAGNOSIS — Z139 Encounter for screening, unspecified: Secondary | ICD-10-CM

## 2014-10-04 ENCOUNTER — Encounter (HOSPITAL_COMMUNITY): Payer: Self-pay | Admitting: Emergency Medicine

## 2014-10-04 ENCOUNTER — Emergency Department (HOSPITAL_COMMUNITY)
Admission: EM | Admit: 2014-10-04 | Discharge: 2014-10-04 | Disposition: A | Discharge status: Home / Self Care | Payer: No Typology Code available for payment source | Attending: Emergency Medicine | Admitting: Emergency Medicine

## 2014-10-04 ENCOUNTER — Emergency Department (HOSPITAL_COMMUNITY): Payer: No Typology Code available for payment source

## 2014-10-04 DIAGNOSIS — J45909 Unspecified asthma, uncomplicated: Secondary | ICD-10-CM | POA: Diagnosis not present

## 2014-10-04 DIAGNOSIS — M25462 Effusion, left knee: Secondary | ICD-10-CM | POA: Insufficient documentation

## 2014-10-04 DIAGNOSIS — Z8719 Personal history of other diseases of the digestive system: Secondary | ICD-10-CM | POA: Diagnosis not present

## 2014-10-04 DIAGNOSIS — M25562 Pain in left knee: Secondary | ICD-10-CM | POA: Diagnosis present

## 2014-10-04 DIAGNOSIS — Z79899 Other long term (current) drug therapy: Secondary | ICD-10-CM | POA: Diagnosis not present

## 2014-10-04 DIAGNOSIS — I10 Essential (primary) hypertension: Secondary | ICD-10-CM | POA: Diagnosis not present

## 2014-10-04 DIAGNOSIS — M1712 Unilateral primary osteoarthritis, left knee: Secondary | ICD-10-CM | POA: Insufficient documentation

## 2014-10-04 DIAGNOSIS — Z91040 Latex allergy status: Secondary | ICD-10-CM | POA: Insufficient documentation

## 2014-10-04 NOTE — Discharge Instructions (Signed)
Arthritis, Nonspecific °Arthritis is inflammation of a joint. This usually means pain, redness, warmth or swelling are present. One or more joints may be involved. There are a number of types of arthritis. Your caregiver may not be able to tell what type of arthritis you have right away. °CAUSES  °The most common cause of arthritis is the wear and tear on the joint (osteoarthritis). This causes damage to the cartilage, which can break down over time. The knees, hips, back and neck are most often affected by this type of arthritis. °Other types of arthritis and common causes of joint pain include: °· Sprains and other injuries near the joint. Sometimes minor sprains and injuries cause pain and swelling that develop hours later. °· Rheumatoid arthritis. This affects hands, feet and knees. It usually affects both sides of your body at the same time. It is often associated with chronic ailments, fever, weight loss and general weakness. °· Crystal arthritis. Gout and pseudo gout can cause occasional acute severe pain, redness and swelling in the foot, ankle, or knee. °· Infectious arthritis. Bacteria can get into a joint through a break in overlying skin. This can cause infection of the joint. Bacteria and viruses can also spread through the blood and affect your joints. °· Drug, infectious and allergy reactions. Sometimes joints can become mildly painful and slightly swollen with these types of illnesses. °SYMPTOMS  °· Pain is the main symptom. °· Your joint or joints can also be red, swollen and warm or hot to the touch. °· You may have a fever with certain types of arthritis, or even feel overall ill. °· The joint with arthritis will hurt with movement. Stiffness is present with some types of arthritis. °DIAGNOSIS  °Your caregiver will suspect arthritis based on your description of your symptoms and on your exam. Testing may be needed to find the type of arthritis: °· Blood and sometimes urine tests. °· X-ray tests  and sometimes CT or MRI scans. °· Removal of fluid from the joint (arthrocentesis) is done to check for bacteria, crystals or other causes. Your caregiver (or a specialist) will numb the area over the joint with a local anesthetic, and use a needle to remove joint fluid for examination. This procedure is only minimally uncomfortable. °· Even with these tests, your caregiver may not be able to tell what kind of arthritis you have. Consultation with a specialist (rheumatologist) may be helpful. °TREATMENT  °Your caregiver will discuss with you treatment specific to your type of arthritis. If the specific type cannot be determined, then the following general recommendations may apply. °Treatment of severe joint pain includes: °· Rest. °· Elevation. °· Anti-inflammatory medication (for example, ibuprofen) may be prescribed. Avoiding activities that cause increased pain. °· Only take over-the-counter or prescription medicines for pain and discomfort as recommended by your caregiver. °· Cold packs over an inflamed joint may be used for 10 to 15 minutes every hour. Hot packs sometimes feel better, but do not use overnight. Do not use hot packs if you are diabetic without your caregiver's permission. °· A cortisone shot into arthritic joints may help reduce pain and swelling. °· Any acute arthritis that gets worse over the next 1 to 2 days needs to be looked at to be sure there is no joint infection. °Long-term arthritis treatment involves modifying activities and lifestyle to reduce joint stress jarring. This can include weight loss. Also, exercise is needed to nourish the joint cartilage and remove waste. This helps keep the muscles   around the joint strong. HOME CARE INSTRUCTIONS   Do not take aspirin to relieve pain if gout is suspected. This elevates uric acid levels.  Only take over-the-counter or prescription medicines for pain, discomfort or fever as directed by your caregiver.  Rest the joint as much as  possible.  If your joint is swollen, keep it elevated.  Use crutches if the painful joint is in your leg.  Drinking plenty of fluids may help for certain types of arthritis.  Follow your caregiver's dietary instructions.  Try low-impact exercise such as:  Swimming.  Water aerobics.  Biking.  Walking.  Morning stiffness is often relieved by a warm shower.  Put your joints through regular range-of-motion. SEEK MEDICAL CARE IF:   You do not feel better in 24 hours or are getting worse.  You have side effects to medications, or are not getting better with treatment. SEEK IMMEDIATE MEDICAL CARE IF:   You have a fever.  You develop severe joint pain, swelling or redness.  Many joints are involved and become painful and swollen.  There is severe back pain and/or leg weakness.  You have loss of bowel or bladder control. Document Released: 01/14/2005 Document Revised: 02/29/2012 Document Reviewed: 01/30/2009 [facility] Patient Information 2015 [place], [place]. This information is not intended to replace advice given to you by your health care provider. Make sure you discuss any questions you have with your health care provider.  Heat Therapy Heat therapy can help ease sore, stiff, injured, and tight muscles and joints. Heat relaxes your muscles, which may help ease your pain.  RISKS AND COMPLICATIONS If you have any of the following conditions, do not use heat therapy unless your health care provider has approved:  Poor circulation.  Healing wounds or scarred skin in the area being treated.  Diabetes, heart disease, or high blood pressure.  Not being able to feel (numbness) the area being treated.  Unusual swelling of the area being treated.  Active infections.  Blood clots.  Cancer.  Inability to communicate pain. This may include young children and people who have problems with their brain function (dementia).  Pregnancy. Heat therapy should only be used on  old, pre-existing, or long-lasting (chronic) injuries. Do not use heat therapy on new injuries unless directed by your health care provider. HOW TO USE HEAT THERAPY There are several different kinds of heat therapy, including:  Moist heat pack.  Warm water bath.  Hot water bottle.  Electric heating pad.  Heated gel pack.  Heated wrap.  Electric heating pad. Use the heat therapy method suggested by your health care provider. Follow your health care provider's instructions on when and how to use heat therapy. GENERAL HEAT THERAPY RECOMMENDATIONS  Do not sleep while using heat therapy. Only use heat therapy while you are awake.  Your skin may turn pink while using heat therapy. Do not use heat therapy if your skin turns red.  Do not use heat therapy if you have new pain.  High heat or long exposure to heat can cause burns. Be careful when using heat therapy to avoid burning your skin.  Do not use heat therapy on areas of your skin that are already irritated, such as with a rash or sunburn. SEEK MEDICAL CARE IF:  You have blisters, redness, swelling, or numbness.  You have new pain.  Your pain is worse. MAKE SURE YOU:  Understand these instructions.  Will watch your condition.  Will get help right away if you are not  doing well or get worse. Document Released: 02/29/2012 Document Revised: 04/23/2014 Document Reviewed: 01/30/2014 [facility] Patient Information 2015 [place], [place]. This information is not intended to replace advice given to you by your health care provider. Make sure you discuss any questions you have with your health care provider.
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
## 2014-10-08 NOTE — ED Provider Notes (Signed)
History/physical exam/procedure(s) were performed by non-physician practitioner and as supervising physician I was immediately available for consultation/collaboration. I have reviewed all notes and am in agreement with care and plan.   Shaune Pollack, MD 10/08/14 [phone]

## 2014-10-22 ENCOUNTER — Encounter (HOSPITAL_COMMUNITY): Payer: Self-pay | Admitting: Emergency Medicine

## 2015-01-21 ENCOUNTER — Other Ambulatory Visit (HOSPITAL_COMMUNITY): Payer: Self-pay | Admitting: Family Medicine

## 2015-01-21 DIAGNOSIS — Z1231 Encounter for screening mammogram for malignant neoplasm of breast: Secondary | ICD-10-CM

## 2015-01-30 ENCOUNTER — Ambulatory Visit (HOSPITAL_COMMUNITY)
Admission: RE | Admit: 2015-01-30 | Discharge: 2015-01-30 | Disposition: A | Discharge status: Home / Self Care | Payer: No Typology Code available for payment source | Source: Ambulatory Visit | Attending: Family Medicine | Admitting: Family Medicine

## 2015-01-30 DIAGNOSIS — Z1231 Encounter for screening mammogram for malignant neoplasm of breast: Secondary | ICD-10-CM | POA: Insufficient documentation

## 2015-03-02 ENCOUNTER — Emergency Department (HOSPITAL_COMMUNITY)
Admission: EM | Admit: 2015-03-02 | Discharge: 2015-03-02 | Disposition: A | Discharge status: Home / Self Care | Payer: No Typology Code available for payment source | Attending: Emergency Medicine | Admitting: Emergency Medicine

## 2015-03-02 ENCOUNTER — Encounter (HOSPITAL_COMMUNITY): Payer: Self-pay | Admitting: Emergency Medicine

## 2015-03-02 DIAGNOSIS — J45909 Unspecified asthma, uncomplicated: Secondary | ICD-10-CM | POA: Diagnosis not present

## 2015-03-02 DIAGNOSIS — L259 Unspecified contact dermatitis, unspecified cause: Secondary | ICD-10-CM | POA: Insufficient documentation

## 2015-03-02 DIAGNOSIS — Z792 Long term (current) use of antibiotics: Secondary | ICD-10-CM | POA: Insufficient documentation

## 2015-03-02 DIAGNOSIS — Z91040 Latex allergy status: Secondary | ICD-10-CM | POA: Diagnosis not present

## 2015-03-02 DIAGNOSIS — R21 Rash and other nonspecific skin eruption: Secondary | ICD-10-CM | POA: Diagnosis present

## 2015-03-02 DIAGNOSIS — I10 Essential (primary) hypertension: Secondary | ICD-10-CM | POA: Insufficient documentation

## 2015-03-02 DIAGNOSIS — Z8719 Personal history of other diseases of the digestive system: Secondary | ICD-10-CM | POA: Insufficient documentation

## 2015-03-02 DIAGNOSIS — Z79899 Other long term (current) drug therapy: Secondary | ICD-10-CM | POA: Diagnosis not present

## 2015-03-02 DIAGNOSIS — L309 Dermatitis, unspecified: Secondary | ICD-10-CM

## 2015-03-02 MED ORDER — DIPHENHYDRAMINE HCL 25 MG PO CAPS
25.0000 mg | ORAL_CAPSULE | Freq: Once | ORAL | Status: AC
  Administered 2015-03-02: 25 mg via ORAL
  Filled 2015-03-02: qty 1

## 2015-03-02 MED ORDER — FAMOTIDINE 20 MG PO TABS: 20.0000 mg | ORAL_TABLET | Freq: Two times a day (BID) | ORAL | Status: DC

## 2015-03-02 MED ORDER — PREDNISONE 50 MG PO TABS: ORAL_TABLET | ORAL | Status: DC

## 2015-03-02 MED ORDER — FAMOTIDINE 20 MG PO TABS
40.0000 mg | ORAL_TABLET | Freq: Once | ORAL | Status: AC
  Administered 2015-03-02: 40 mg via ORAL
  Filled 2015-03-02: qty 2

## 2015-03-02 MED ORDER — PREDNISONE 50 MG PO TABS
60.0000 mg | ORAL_TABLET | Freq: Once | ORAL | Status: AC
  Administered 2015-03-02: 60 mg via ORAL
  Filled 2015-03-02 (×2): qty 1

## 2015-03-02 NOTE — Discharge Instructions (Signed)
Start prescription medicine tomorrow. Can also take Benadryl every 4-6 hours. Follow-up your primary care doctor next week if not improving.

## 2015-03-02 NOTE — ED Provider Notes (Signed)
CSN: [id number]     Arrival date & time 03/02/15  7408 History  This chart was scribed for Nat Christen, MD by Peyton Bottoms, ED Scribe. This patient was seen in room APA18/APA18 and the patient's care was started at 8:47 AM.    Chief Complaint  Patient presents with  . Rash   Patient is a 59 y.o. female presenting with rash. The history is provided by the patient. No language interpreter was used.  Rash  HPI Comments: [name] is a 59 y.o. female with a PMHx of hypertension, asthma and diverticulitis, who presents to the Emergency Department complaining of a moderate rash predominantly to upper chest, as well as right antecubital, and right index finger. She reports assocaited itchiness and slight burning that began 4-5 days ago. She denies wearing unusual material to neck, such as necklace. She denies associated changes in appetite. She denies taking any medication prior to arrival. Patient is seen by PCP Dr. Karie Kirks.  Past Medical History  Diagnosis Date  . Hypertension   . Asthma   . Diverticulitis    Past Surgical History  Procedure Laterality Date  . Cholecystectomy    . Ablation colpoclesis     Family History  Problem Relation Age of Onset  . Emphysema Mother   . Stroke Father   . Hypertension Father   . Diabetes Sister   . Hypertension Sister   . Stroke Brother   . Hypertension Brother    History  Substance Use Topics  . Smoking status: Never Smoker   . Smokeless tobacco: Never Used  . Alcohol Use: No   OB History    Gravida Para Term Preterm AB TAB SAB Ectopic Multiple Living   3 2 2  1  1         Review of Systems  Skin: Positive for rash.   A complete 10 system review of systems was obtained and all systems are negative except as noted in the HPI and PMH.   Allergies  Iohexol; Latex; and Sulfa antibiotics  Home Medications   Prior to Admission medications   Medication Sig Start Date End Date Taking? Authorizing Provider  albuterol (PROVENTIL  HFA;VENTOLIN HFA) 108 (90 BASE) MCG/ACT inhaler Inhale 2 puffs into the lungs every 6 (six) hours as needed. Asthma    Historical Provider, MD  famotidine (PEPCID) 20 MG tablet Take 1 tablet (20 mg total) by mouth 2 (two) times daily. 03/02/15   Nat Christen, MD  ibuprofen (ADVIL,MOTRIN) 200 MG tablet Take 200 mg by mouth every 6 (six) hours as needed for pain.    Historical Provider, MD  Multiple Vitamins-Minerals (ONE-A-DAY ENERGY PO) Take 1 tablet by mouth daily.    Historical Provider, MD  ondansetron (ZOFRAN) 4 MG tablet Take 2 tablets (8 mg total) by mouth every 8 (eight) hours as needed for nausea. 02/16/13   Evalee Jefferson, PA-C  predniSONE (DELTASONE) 50 MG tablet One tab daily for 7 days 03/02/15   Nat Christen, MD  triamterene-hydrochlorothiazide (MAXZIDE) 75-50 MG per tablet Take 0.5 tablets by mouth daily.    Historical Provider, MD   Triage Vitals: BP 137/52 mmHg  Pulse 89  Temp(Src) 97.7 F (36.5 C) (Oral)  Ht 4\' 11"  (1.499 m)  Wt 200 lb (90.719 kg)  BMI 40.37 kg/m2  SpO2 100%  Physical Exam  Constitutional: She is oriented to person, place, and time. She appears well-developed and well-nourished.  HENT:  Head: Normocephalic and atraumatic.  Eyes: Conjunctivae and EOM are normal.  Pupils are equal, round, and reactive to light.  Neck: Normal range of motion. Neck supple.  Cardiovascular: Normal rate and regular rhythm.   Pulmonary/Chest: Effort normal and breath sounds normal.  Abdominal: Soft. Bowel sounds are normal.  Musculoskeletal: Normal range of motion.  Neurological: She is alert and oriented to person, place, and time.  Skin: Skin is warm and dry. Rash noted. There is erythema.  Erythematous, papular wheal predominantly in upper chest and also right antecubital and right index finger.  Psychiatric: She has a normal mood and affect. Her behavior is normal.  Nursing note and vitals reviewed.  ED Course  Procedures (including critical care time)  DIAGNOSTIC  STUDIES: Oxygen Saturation is 100% on RA, normal by my interpretation.    COORDINATION OF CARE: 8:51 AM- Discussed plans to prednisone, benadryl and Pepcid. Will also discharge patient with prescription medication. Pt advised of plan for treatment and pt agrees.  Labs Review Labs Reviewed - No data to display  Imaging Review No results found.   EKG Interpretation None     MDM   Final diagnoses:  Dermatitis    History and physical consistent with contact dermatitis. No respiratory distress. No petechiae. Medications prednisone, Benadryl, Pepcid.  I personally performed the services described in this documentation, which was scribed in my presence. The recorded information has been reviewed and is accurate.   Nat Christen, MD 03/03/15 [phone]

## 2015-03-02 NOTE — ED Notes (Signed)
Patient c/o rash to entire body with itching and slight burning x4-5 days. Patient denies any new soap, detergent, or foods. Patient reports using calamine lotion with no relief.

## 2015-05-01 ENCOUNTER — Encounter (HOSPITAL_COMMUNITY): Payer: Self-pay | Admitting: *Deleted

## 2015-05-01 ENCOUNTER — Emergency Department (HOSPITAL_COMMUNITY): Payer: No Typology Code available for payment source

## 2015-05-01 ENCOUNTER — Inpatient Hospital Stay (HOSPITAL_COMMUNITY)
Admission: EM | Admit: 2015-05-01 | Discharge: 2015-05-04 | DRG: 871 | Disposition: A | Discharge status: Home / Self Care | Payer: No Typology Code available for payment source | Attending: Internal Medicine | Admitting: Internal Medicine

## 2015-05-01 DIAGNOSIS — J45909 Unspecified asthma, uncomplicated: Secondary | ICD-10-CM | POA: Diagnosis present

## 2015-05-01 DIAGNOSIS — R509 Fever, unspecified: Secondary | ICD-10-CM

## 2015-05-01 DIAGNOSIS — J189 Pneumonia, unspecified organism: Secondary | ICD-10-CM | POA: Diagnosis present

## 2015-05-01 DIAGNOSIS — A419 Sepsis, unspecified organism: Principal | ICD-10-CM | POA: Diagnosis present

## 2015-05-01 DIAGNOSIS — J9600 Acute respiratory failure, unspecified whether with hypoxia or hypercapnia: Secondary | ICD-10-CM | POA: Diagnosis present

## 2015-05-01 DIAGNOSIS — Z823 Family history of stroke: Secondary | ICD-10-CM

## 2015-05-01 DIAGNOSIS — Z833 Family history of diabetes mellitus: Secondary | ICD-10-CM

## 2015-05-01 DIAGNOSIS — J9601 Acute respiratory failure with hypoxia: Secondary | ICD-10-CM | POA: Diagnosis present

## 2015-05-01 DIAGNOSIS — I10 Essential (primary) hypertension: Secondary | ICD-10-CM | POA: Diagnosis present

## 2015-05-01 DIAGNOSIS — Z8249 Family history of ischemic heart disease and other diseases of the circulatory system: Secondary | ICD-10-CM

## 2015-05-01 DIAGNOSIS — R6889 Other general symptoms and signs: Secondary | ICD-10-CM

## 2015-05-01 LAB — URINALYSIS, ROUTINE W REFLEX MICROSCOPIC
Bilirubin Urine: NEGATIVE
Glucose, UA: NEGATIVE mg/dL
Hgb urine dipstick: NEGATIVE
KETONES UR: NEGATIVE mg/dL
NITRITE: NEGATIVE
PROTEIN: NEGATIVE mg/dL
Specific Gravity, Urine: 1.02 (ref 1.005–1.030)
Urobilinogen, UA: 0.2 mg/dL (ref 0.0–1.0)
pH: 6 (ref 5.0–8.0)

## 2015-05-01 LAB — COMPREHENSIVE METABOLIC PANEL
ALT: 14 U/L (ref 14–54)
ANION GAP: 8 (ref 5–15)
AST: 24 U/L (ref 15–41)
Albumin: 3.9 g/dL (ref 3.5–5.0)
Alkaline Phosphatase: 105 U/L (ref 38–126)
BILIRUBIN TOTAL: 0.5 mg/dL (ref 0.3–1.2)
BUN: 12 mg/dL (ref 6–20)
CHLORIDE: 105 mmol/L (ref 101–111)
CO2: 22 mmol/L (ref 22–32)
CREATININE: 1.08 mg/dL — AB (ref 0.44–1.00)
Calcium: 9.6 mg/dL (ref 8.9–10.3)
GFR, EST NON AFRICAN AMERICAN: 55 mL/min — AB (ref >60)
GLUCOSE: 143 mg/dL — AB (ref 70–99)
Potassium: 3.4 mmol/L — ABNORMAL LOW (ref 3.5–5.1)
SODIUM: 135 mmol/L (ref 135–145)
Total Protein: 7.5 g/dL (ref 6.5–8.1)

## 2015-05-01 LAB — CBC WITH DIFFERENTIAL/PLATELET
BASOS ABS: 0.1 10*3/uL (ref 0.0–0.1)
Basophils Relative: 0 % (ref 0–1)
Eosinophils Absolute: 0.2 10*3/uL (ref 0.0–0.7)
Eosinophils Relative: 1 % (ref 0–5)
HEMATOCRIT: 36.2 % (ref 36.0–46.0)
Hemoglobin: 11.7 g/dL — ABNORMAL LOW (ref 12.0–15.0)
LYMPHS PCT: 12 % (ref 12–46)
Lymphs Abs: 1.8 10*3/uL (ref 0.7–4.0)
MCH: 26.2 pg (ref 26.0–34.0)
MCHC: 32.3 g/dL (ref 30.0–36.0)
MCV: 81.2 fL (ref 78.0–100.0)
MONO ABS: 0.7 10*3/uL (ref 0.1–1.0)
Monocytes Relative: 5 % (ref 3–12)
NEUTROS ABS: 11.9 10*3/uL — AB (ref 1.7–7.7)
NEUTROS PCT: 82 % — AB (ref 43–77)
Platelets: 333 10*3/uL (ref 150–400)
RBC: 4.46 MIL/uL (ref 3.87–5.11)
RDW: 15.6 % — AB (ref 11.5–15.5)
WBC: 14.6 10*3/uL — AB (ref 4.0–10.5)

## 2015-05-01 LAB — URINE MICROSCOPIC-ADD ON

## 2015-05-01 LAB — INFLUENZA PANEL BY PCR (TYPE A & B)
H1N1 flu by pcr: NOT DETECTED
INFLAPCR: NEGATIVE
Influenza B By PCR: NEGATIVE

## 2015-05-01 LAB — I-STAT CG4 LACTIC ACID, ED
Lactic Acid, Venous: 1.9 mmol/L (ref 0.5–2.0)
Lactic Acid, Venous: 0.99 mmol/L (ref 0.5–2.0)

## 2015-05-01 MED ORDER — SODIUM CHLORIDE 0.9 % IV BOLUS (SEPSIS)
1000.0000 mL | INTRAVENOUS | Status: AC
  Administered 2015-05-01 (×3): 1000 mL via INTRAVENOUS

## 2015-05-01 MED ORDER — ACETAMINOPHEN 500 MG PO TABS
1000.0000 mg | ORAL_TABLET | Freq: Once | ORAL | Status: AC
  Administered 2015-05-01: 1000 mg via ORAL
  Filled 2015-05-01: qty 2

## 2015-05-01 MED ORDER — PIPERACILLIN-TAZOBACTAM 3.375 G IVPB 30 MIN
3.3750 g | Freq: Once | INTRAVENOUS | Status: AC
  Administered 2015-05-01: 3.375 g via INTRAVENOUS
  Filled 2015-05-01: qty 50

## 2015-05-01 MED ORDER — VANCOMYCIN HCL IN DEXTROSE 750-5 MG/150ML-% IV SOLN
750.0000 mg | Freq: Two times a day (BID) | INTRAVENOUS | Status: DC
  Administered 2015-05-02 – 2015-05-03 (×3): 750 mg via INTRAVENOUS
  Filled 2015-05-01 (×9): qty 150

## 2015-05-01 MED ORDER — VANCOMYCIN HCL IN DEXTROSE 1-5 GM/200ML-% IV SOLN
1000.0000 mg | Freq: Once | INTRAVENOUS | Status: AC
  Administered 2015-05-01: 1000 mg via INTRAVENOUS
  Filled 2015-05-01: qty 200

## 2015-05-01 MED ORDER — PIPERACILLIN-TAZOBACTAM 3.375 G IVPB: 3.3750 g | Freq: Three times a day (TID) | INTRAVENOUS | Status: DC

## 2015-05-01 NOTE — ED Notes (Signed)
Pt c/o fever, n/v, sob and generalized bodyaches. Pt c/o chest pain when she takes a deep breath. Pt swatting at "black bees" in the room.

## 2015-05-01 NOTE — ED Provider Notes (Addendum)
CSN: [id number]     Arrival date & time 05/01/15  1954 History  This chart was scribed for Fredia Sorrow, MD by Jeanell Sparrow, ED Scribe. This patient was seen in room APA09/APA09 and the patient's care was started at 8:31 PM.   Chief Complaint  Patient presents with  . Fever   Patient is a 59 y.o. female presenting with fever. The history is provided by the patient. No language interpreter was used.  Fever Temp source:  Subjective Severity:  Moderate Onset quality:  Gradual Duration:  4 days Timing:  Constant Progression:  Unchanged Chronicity:  New Relieved by:  None tried Worsened by:  Nothing tried Ineffective treatments:  None tried Associated symptoms: chest pain, chills, congestion, cough, myalgias (generalized), nausea, rhinorrhea and vomiting   Associated symptoms: no confusion, no diarrhea, no dysuria, no headaches and no rash   Risk factors: no sick contacts    HPI Comments: [name] is a 59 y.o. female who presents to the Emergency Department complaining of a constant moderate fever that started 3 days ago. She states that she has been sick with subjective fever, chills, productive cough, wheezing, rhinorrhea, congestion, sore throat, and generalized myalgias that started 3 days ago. Pt's temperature in the ED today is 104.6. She reports that she started having nausea today with 5 episodes of vomiting. She states that she has also been having chest pain and SOB. She reports no modifying factors. She denies any sick contacts. She also denies any diarrhea, abdominal pain, dysuria, hematuria, leg swelling, rash, back pain, or use of blood thinners.   Past Medical History  Diagnosis Date  . Hypertension   . Asthma   . Diverticulitis    Past Surgical History  Procedure Laterality Date  . Cholecystectomy    . Ablation colpoclesis     Family History  Problem Relation Age of Onset  . Emphysema Mother   . Stroke Father   . Hypertension Father   . Diabetes Sister    . Hypertension Sister   . Stroke Brother   . Hypertension Brother    History  Substance Use Topics  . Smoking status: Never Smoker   . Smokeless tobacco: Never Used  . Alcohol Use: No   OB History    Gravida Para Term Preterm AB TAB SAB Ectopic Multiple Living   3 2 2  1  1         Review of Systems  Constitutional: Positive for fever and chills.  HENT: Positive for congestion and rhinorrhea.   Eyes: Negative for visual disturbance.  Respiratory: Positive for cough, shortness of breath and wheezing.   Cardiovascular: Positive for chest pain. Negative for leg swelling.  Gastrointestinal: Positive for nausea and vomiting. Negative for abdominal pain and diarrhea.  Genitourinary: Negative for dysuria and hematuria.  Musculoskeletal: Positive for myalgias (generalized). Negative for back pain.  Skin: Negative for rash.  Neurological: Negative for headaches.  Hematological: Does not bruise/bleed easily.  Psychiatric/Behavioral: Negative for confusion.    Allergies  Iohexol; Latex; and Sulfa antibiotics  Home Medications   Prior to Admission medications   Medication Sig Start Date End Date Taking? Authorizing Provider  albuterol (PROVENTIL HFA;VENTOLIN HFA) 108 (90 BASE) MCG/ACT inhaler Inhale 2 puffs into the lungs every 6 (six) hours as needed. Asthma   Yes Historical Provider, MD  triamterene-hydrochlorothiazide (MAXZIDE) 75-50 MG per tablet Take 0.5 tablets by mouth daily.   Yes Historical Provider, MD  famotidine (PEPCID) 20 MG tablet Take 1 tablet (20  mg total) by mouth 2 (two) times daily. Patient not taking: Reported on 05/01/2015 03/02/15   Nat Christen, MD  ondansetron (ZOFRAN) 4 MG tablet Take 2 tablets (8 mg total) by mouth every 8 (eight) hours as needed for nausea. Patient not taking: Reported on 05/01/2015 02/16/13   Evalee Jefferson, PA-C  predniSONE (DELTASONE) 50 MG tablet One tab daily for 7 days Patient not taking: Reported on 05/01/2015 03/02/15   Nat Christen, MD   BP  162/99 mmHg  Pulse 149  Temp(Src) 104.6 F (40.3 C)  Resp 68  Ht 4\' 11"  (1.499 m)  Wt 199 lb (90.266 kg)  BMI 40.17 kg/m2  SpO2 94% Physical Exam  Constitutional: She is oriented to person, place, and time. She appears well-developed and well-nourished. No distress.  HENT:  Head: Normocephalic and atraumatic.  Mucous membranes mildly dry. Facial movements intact.   Eyes: EOM are normal. Pupils are equal, round, and reactive to light.  Neck: Neck supple. No tracheal deviation present.  Cardiovascular:  No murmur heard. Tachycardic.   Pulmonary/Chest: Effort normal. No respiratory distress. She has no wheezes. She has no rales.  Increased respiratory rate.   Abdominal: Soft. Bowel sounds are normal. There is no tenderness.  Musculoskeletal: Normal range of motion. She exhibits no edema.  No ankle swelling.   Neurological: She is alert and oriented to person, place, and time. No cranial nerve deficit. She exhibits normal muscle tone. Coordination normal.  Skin: Skin is warm and dry.  Psychiatric: She has a normal mood and affect. Her behavior is normal.  Nursing note and vitals reviewed.   ED Course  Procedures (including critical care time) DIAGNOSTIC STUDIES: Oxygen Saturation is 94% on RA, normal by my interpretation.    COORDINATION OF CARE: 8:35 PM- Pt advised of plan for treatment which includes medication, radiology, and labs and pt agrees.  Labs Review Labs Reviewed  CBC WITH DIFFERENTIAL/PLATELET - Abnormal; Notable for the following:    WBC 14.6 (*)    Hemoglobin 11.7 (*)    RDW 15.6 (*)    Neutrophils Relative % 82 (*)    Neutro Abs 11.9 (*)    All other components within normal limits  COMPREHENSIVE METABOLIC PANEL - Abnormal; Notable for the following:    Potassium 3.4 (*)    Glucose, Bld 143 (*)    Creatinine, Ser 1.08 (*)    GFR calc non Af Amer 55 (*)    All other components within normal limits  URINALYSIS, ROUTINE W REFLEX MICROSCOPIC - Abnormal;  Notable for the following:    Leukocytes, UA TRACE (*)    All other components within normal limits  URINE MICROSCOPIC-ADD ON - Abnormal; Notable for the following:    Squamous Epithelial / LPF FEW (*)    Bacteria, UA FEW (*)    All other components within normal limits  CULTURE, BLOOD (ROUTINE X 2)  CULTURE, BLOOD (ROUTINE X 2)  URINE CULTURE  INFLUENZA PANEL BY PCR (TYPE A & B, H1N1)  I-STAT CG4 LACTIC ACID, ED  I-STAT CG4 LACTIC ACID, ED  I-STAT CG4 LACTIC ACID, ED   Results for orders placed or performed during the hospital encounter of 05/01/15  Blood Culture (routine x 2)  Result Value Ref Range   Specimen Description BLOOD LEFT FOREARM    Special Requests BOTTLES DRAWN AEROBIC AND ANAEROBIC 6CC    Culture PENDING    Report Status PENDING   Blood Culture (routine x 2)  Result Value Ref Range  Specimen Description BLOOD RIGHT HAND    Special Requests BOTTLES DRAWN AEROBIC AND ANAEROBIC 6CC    Culture PENDING    Report Status PENDING   CBC WITH DIFFERENTIAL  Result Value Ref Range   WBC 14.6 (H) 4.0 - 10.5 K/uL   RBC 4.46 3.87 - 5.11 MIL/uL   Hemoglobin 11.7 (L) 12.0 - 15.0 g/dL   HCT 36.2 36.0 - 46.0 %   MCV 81.2 78.0 - 100.0 fL   MCH 26.2 26.0 - 34.0 pg   MCHC 32.3 30.0 - 36.0 g/dL   RDW 15.6 (H) 11.5 - 15.5 %   Platelets 333 150 - 400 K/uL   Neutrophils Relative % 82 (H) 43 - 77 %   Neutro Abs 11.9 (H) 1.7 - 7.7 K/uL   Lymphocytes Relative 12 12 - 46 %   Lymphs Abs 1.8 0.7 - 4.0 K/uL   Monocytes Relative 5 3 - 12 %   Monocytes Absolute 0.7 0.1 - 1.0 K/uL   Eosinophils Relative 1 0 - 5 %   Eosinophils Absolute 0.2 0.0 - 0.7 K/uL   Basophils Relative 0 0 - 1 %   Basophils Absolute 0.1 0.0 - 0.1 K/uL  Comprehensive metabolic panel  Result Value Ref Range   Sodium 135 135 - 145 mmol/L   Potassium 3.4 (L) 3.5 - 5.1 mmol/L   Chloride 105 101 - 111 mmol/L   CO2 22 22 - 32 mmol/L   Glucose, Bld 143 (H) 70 - 99 mg/dL   BUN 12 6 - 20 mg/dL   Creatinine, Ser 1.08  (H) 0.44 - 1.00 mg/dL   Calcium 9.6 8.9 - 10.3 mg/dL   Total Protein 7.5 6.5 - 8.1 g/dL   Albumin 3.9 3.5 - 5.0 g/dL   AST 24 15 - 41 U/L   ALT 14 14 - 54 U/L   Alkaline Phosphatase 105 38 - 126 U/L   Total Bilirubin 0.5 0.3 - 1.2 mg/dL   GFR calc non Af Amer 55 (L) >60 mL/min   GFR calc Af Amer >60 >60 mL/min   Anion gap 8 5 - 15  Urinalysis, Routine w reflex microscopic  Result Value Ref Range   Color, Urine YELLOW YELLOW   APPearance CLEAR CLEAR   Specific Gravity, Urine 1.020 1.005 - 1.030   pH 6.0 5.0 - 8.0   Glucose, UA NEGATIVE NEGATIVE mg/dL   Hgb urine dipstick NEGATIVE NEGATIVE   Bilirubin Urine NEGATIVE NEGATIVE   Ketones, ur NEGATIVE NEGATIVE mg/dL   Protein, ur NEGATIVE NEGATIVE mg/dL   Urobilinogen, UA 0.2 0.0 - 1.0 mg/dL   Nitrite NEGATIVE NEGATIVE   Leukocytes, UA TRACE (A) NEGATIVE  Influenza panel by PCR (type A & B, H1N1)  Result Value Ref Range   Influenza A By PCR NEGATIVE NEGATIVE   Influenza B By PCR NEGATIVE NEGATIVE   H1N1 flu by pcr NOT DETECTED NOT DETECTED  Urine microscopic-add on  Result Value Ref Range   Squamous Epithelial / LPF FEW (A) RARE   WBC, UA 3-6 <3 WBC/hpf   Bacteria, UA FEW (A) RARE   Urine-Other RARE YEAST   I-Stat CG4 Lactic Acid, ED  (not at [facility])  Result Value Ref Range   Lactic Acid, Venous 1.90 0.5 - 2.0 mmol/L  I-Stat CG4 Lactic Acid, ED  Result Value Ref Range   Lactic Acid, Venous 0.99 0.5 - 2.0 mmol/L    Imaging Review Dg Chest Port 1 View  05/01/2015   CLINICAL DATA:  Sepsis, fever.  EXAM: PORTABLE CHEST - 1 VIEW  COMPARISON:  February 16, 2013.  FINDINGS: The heart size and mediastinal contours are within normal limits. Hypoinflation of the lungs is noted. No pneumothorax or pleural effusion is noted. Left lung is clear. Stable right basilar density is noted consistent with scarring. No acute pulmonary disease is noted. The visualized skeletal structures are unremarkable.  IMPRESSION: No acute cardiopulmonary  abnormality seen.   Electronically Signed   By: Marijo Conception, M.D.   On: 05/01/2015 21:19     EKG Interpretation   Date/Time:  Wednesday May 01 2015 23:49:41 EDT Ventricular Rate:  118 PR Interval:  142 QRS Duration: 103 QT Interval:  327 QTC Calculation: 458 R Axis:   -63 Text Interpretation:  Sinus tachycardia Left anterior fascicular block  Abnormal R-wave progression, late transition Left ventricular hypertrophy  ST elevation, consider inferior injury Baseline wander in lead(s) I III  aVR aVL V3 V4 V5 Confirmed by Tylea Hise  MD, Kinta Martis ([id number]) on 05/02/2015  12:00:19 AM     Mark sinus tachycardia with heart rate as high as 140. Repeat EKG will be done. Heart rate now down to around 114.   CRITICAL CARE Performed by: Fredia Sorrow Total critical care time: 30 Critical care time was exclusive of separately billable procedures and treating other patients. Critical care was necessary to treat or prevent imminent or life-threatening deterioration. Critical care was time spent personally by me on the following activities: development of treatment plan with patient and/or surrogate as well as nursing, discussions with consultants, evaluation of patient's response to treatment, examination of patient, obtaining history from patient or surrogate, ordering and performing treatments and interventions, ordering and review of laboratory studies, ordering and review of radiographic studies, pulse oximetry and re-evaluation of patient's condition.  MDM   Final diagnoses:  Fever, unspecified fever cause  Flu-like symptoms    Patient with respiratory flulike symptoms. Came in markedly cachectic tachycardic. Never hypotensive marked fever attempt 104.6 never hypoxic. Patient improving with fluids based on sepsis protocol patient received 3 L of normal saline. Heart rate down to 114. Blood pressure still stable fever improved to 99. Chest x-ray negative for pneumonia expected to find that.  Leukocytosis with white count in the 14,000 range. Lactic acid was not elevated it was less than 1. Blood cultures were done patient was originally started on broad-spectrum antibiotics based on septic protocol because of her presentation. No evidence of urinary tract infection. Influenza testing is negative. Patient will require admission still tachycardic will contact require continued fluids. Patient showing signs of improvement. No liver function test abnormalities. Mild elevation of the creatinine at 1.08. We'll discuss with the hospitalist for admission.     I personally performed the services described in this documentation, which was scribed in my presence. The recorded information has been reviewed and is accurate.      Fredia Sorrow, MD 05/01/15 [id number]  Fredia Sorrow, MD 05/02/15 0001

## 2015-05-01 NOTE — Progress Notes (Signed)
[place] for Vancomycin & Zosyn Indication: rule out sepsis  Allergies  Allergen Reactions  . Iohexol Anaphylaxis       . Latex Other (See Comments)    Causes dark spots on skin, but not pain, or irritation.   . Sulfa Antibiotics Rash    Minor Shaking    Patient Measurements: Height: 4\' 11"  (149.9 cm) Weight: 199 lb (90.266 kg) IBW/kg (Calculated) : 43.2  Vital Signs: Temp: 104.6 F (40.3 C) (05/11 2007) BP: 123/99 mmHg (05/11 2042) Pulse Rate: 132 (05/11 2042) Intake/Output from previous day:   Intake/Output from this shift:    Labs:  Recent Labs  05/01/15 2030  WBC 14.6*  HGB 11.7*  PLT 333   CrCl cannot be calculated (Patient has no serum creatinine result on file.). No results for input(s): VANCOTROUGH, VANCOPEAK, VANCORANDOM, GENTTROUGH, GENTPEAK, GENTRANDOM, TOBRATROUGH, TOBRAPEAK, TOBRARND, AMIKACINPEAK, AMIKACINTROU, AMIKACIN in the last 72 hours.   Microbiology: No results found for this or any previous visit (from the past 720 hour(s)).  Anti-infectives    Start     Dose/Rate Route Frequency Ordered Stop   05/01/15 2045  piperacillin-tazobactam (ZOSYN) IVPB 3.375 g     3.375 g 100 mL/hr over 30 Minutes Intravenous  Once 05/01/15 2044     05/01/15 2045  vancomycin (VANCOCIN) IVPB 1000 mg/200 mL premix     1,000 mg 200 mL/hr over 60 Minutes Intravenous  Once 05/01/15 2044        Assessment: 59 yo F who presented with fever (Tm 104.14F).  Empiric, broad-spectrum antibiotics initiated in ED for sepsis.  Estimated CrCl ~ 24ml/min.   Goal of Therapy:  Vancomycin trough level 15-20 mcg/ml  Plan:  Zosyn 3.375gm IV Q8h to be infused over 4hrs Vancomycin 750mg  IV q12h Check Vancomycin trough at steady state Monitor renal function and cx data   Biagio Borg 05/01/2015,9:18 PM

## 2015-05-02 ENCOUNTER — Encounter (HOSPITAL_COMMUNITY): Payer: Self-pay | Admitting: *Deleted

## 2015-05-02 DIAGNOSIS — R6889 Other general symptoms and signs: Secondary | ICD-10-CM

## 2015-05-02 DIAGNOSIS — J45909 Unspecified asthma, uncomplicated: Secondary | ICD-10-CM | POA: Diagnosis present

## 2015-05-02 DIAGNOSIS — Z823 Family history of stroke: Secondary | ICD-10-CM | POA: Diagnosis not present

## 2015-05-02 DIAGNOSIS — R509 Fever, unspecified: Secondary | ICD-10-CM | POA: Diagnosis present

## 2015-05-02 DIAGNOSIS — J189 Pneumonia, unspecified organism: Secondary | ICD-10-CM | POA: Diagnosis not present

## 2015-05-02 DIAGNOSIS — J9600 Acute respiratory failure, unspecified whether with hypoxia or hypercapnia: Secondary | ICD-10-CM | POA: Diagnosis present

## 2015-05-02 DIAGNOSIS — Z8249 Family history of ischemic heart disease and other diseases of the circulatory system: Secondary | ICD-10-CM | POA: Diagnosis not present

## 2015-05-02 DIAGNOSIS — A419 Sepsis, unspecified organism: Secondary | ICD-10-CM | POA: Diagnosis present

## 2015-05-02 DIAGNOSIS — I10 Essential (primary) hypertension: Secondary | ICD-10-CM | POA: Diagnosis not present

## 2015-05-02 DIAGNOSIS — Z833 Family history of diabetes mellitus: Secondary | ICD-10-CM | POA: Diagnosis not present

## 2015-05-02 DIAGNOSIS — J9601 Acute respiratory failure with hypoxia: Secondary | ICD-10-CM | POA: Diagnosis not present

## 2015-05-02 LAB — COMPREHENSIVE METABOLIC PANEL
ALBUMIN: 3.5 g/dL (ref 3.5–5.0)
ALT: 16 U/L (ref 14–54)
AST: 24 U/L (ref 15–41)
Alkaline Phosphatase: 84 U/L (ref 38–126)
Anion gap: 8 (ref 5–15)
BILIRUBIN TOTAL: 0.6 mg/dL (ref 0.3–1.2)
BUN: 12 mg/dL (ref 6–20)
CO2: 23 mmol/L (ref 22–32)
Calcium: 9.2 mg/dL (ref 8.9–10.3)
Chloride: 111 mmol/L (ref 101–111)
Creatinine, Ser: 1.02 mg/dL — ABNORMAL HIGH (ref 0.44–1.00)
GFR calc Af Amer: >60 mL/min (ref >60)
GFR calc non Af Amer: 59 mL/min — ABNORMAL LOW (ref >60)
GLUCOSE: 155 mg/dL — AB (ref 65–99)
POTASSIUM: 4.1 mmol/L (ref 3.5–5.1)
Sodium: 142 mmol/L (ref 135–145)
TOTAL PROTEIN: 6.9 g/dL (ref 6.5–8.1)

## 2015-05-02 LAB — CBC WITH DIFFERENTIAL/PLATELET
Basophils Absolute: 0 10*3/uL (ref 0.0–0.1)
Basophils Relative: 0 % (ref 0–1)
Eosinophils Absolute: 0 10*3/uL (ref 0.0–0.7)
Eosinophils Relative: 0 % (ref 0–5)
HEMATOCRIT: 34.3 % — AB (ref 36.0–46.0)
HEMOGLOBIN: 10.8 g/dL — AB (ref 12.0–15.0)
LYMPHS PCT: 10 % — AB (ref 12–46)
Lymphs Abs: 1.5 10*3/uL (ref 0.7–4.0)
MCH: 26 pg (ref 26.0–34.0)
MCHC: 31.5 g/dL (ref 30.0–36.0)
MCV: 82.7 fL (ref 78.0–100.0)
MONO ABS: 0.6 10*3/uL (ref 0.1–1.0)
MONOS PCT: 4 % (ref 3–12)
NEUTROS ABS: 12.7 10*3/uL — AB (ref 1.7–7.7)
Neutrophils Relative %: 86 % — ABNORMAL HIGH (ref 43–77)
Platelets: 328 10*3/uL (ref 150–400)
RBC: 4.15 MIL/uL (ref 3.87–5.11)
RDW: 16.1 % — AB (ref 11.5–15.5)
WBC: 14.8 10*3/uL — AB (ref 4.0–10.5)

## 2015-05-02 LAB — PROCALCITONIN: Procalcitonin: 0.62 ng/mL

## 2015-05-02 LAB — APTT: APTT: 29 s (ref 24–37)

## 2015-05-02 LAB — BLOOD GAS, ARTERIAL
BICARBONATE: 21.5 meq/L (ref 20.0–24.0)
DRAWN BY: 21310
O2 Content: 2 L/min
O2 SAT: 97.1 %
TCO2: 19.1 mmol/L (ref 0–100)
pCO2 arterial: 37.2 mmHg (ref 35.0–45.0)
pH, Arterial: 7.38 (ref 7.350–7.450)
pO2, Arterial: 96.9 mmHg (ref 80.0–100.0)

## 2015-05-02 LAB — PROTIME-INR
INR: 1.06 (ref 0.00–1.49)
Prothrombin Time: 13.9 seconds (ref 11.6–15.2)

## 2015-05-02 MED ORDER — DEXTROSE 5 % IV SOLN
2.0000 g | Freq: Once | INTRAVENOUS | Status: AC
  Administered 2015-05-02: 2 g via INTRAVENOUS
  Filled 2015-05-02: qty 2

## 2015-05-02 MED ORDER — OSELTAMIVIR PHOSPHATE 75 MG PO CAPS
75.0000 mg | ORAL_CAPSULE | Freq: Two times a day (BID) | ORAL | Status: DC
  Administered 2015-05-02: 75 mg via ORAL
  Filled 2015-05-02: qty 1

## 2015-05-02 MED ORDER — HEPARIN SODIUM (PORCINE) 5000 UNIT/ML IJ SOLN
5000.0000 [IU] | Freq: Three times a day (TID) | INTRAMUSCULAR | Status: DC
  Administered 2015-05-02 – 2015-05-04 (×6): 5000 [IU] via SUBCUTANEOUS
  Filled 2015-05-02 (×6): qty 1

## 2015-05-02 MED ORDER — VANCOMYCIN HCL IN DEXTROSE 1-5 GM/200ML-% IV SOLN: 1000.0000 mg | Freq: Once | INTRAVENOUS | Status: DC

## 2015-05-02 MED ORDER — METHYLPREDNISOLONE SODIUM SUCC 125 MG IJ SOLR
125.0000 mg | Freq: Two times a day (BID) | INTRAMUSCULAR | Status: DC
  Administered 2015-05-02: 125 mg via INTRAVENOUS
  Filled 2015-05-02: qty 2

## 2015-05-02 MED ORDER — DEXTROSE 5 % IV SOLN
1.0000 g | Freq: Three times a day (TID) | INTRAVENOUS | Status: DC
  Administered 2015-05-02 – 2015-05-03 (×3): 1 g via INTRAVENOUS
  Filled 2015-05-02 (×13): qty 1

## 2015-05-02 MED ORDER — IPRATROPIUM-ALBUTEROL 0.5-2.5 (3) MG/3ML IN SOLN
3.0000 mL | RESPIRATORY_TRACT | Status: DC
  Administered 2015-05-02 (×6): 3 mL via RESPIRATORY_TRACT
  Filled 2015-05-02 (×6): qty 3

## 2015-05-02 MED ORDER — METHYLPREDNISOLONE SODIUM SUCC 125 MG IJ SOLR
60.0000 mg | Freq: Four times a day (QID) | INTRAMUSCULAR | Status: DC
  Administered 2015-05-02 – 2015-05-03 (×5): 60 mg via INTRAVENOUS
  Filled 2015-05-02 (×5): qty 2

## 2015-05-02 MED ORDER — IPRATROPIUM-ALBUTEROL 0.5-2.5 (3) MG/3ML IN SOLN: 3.0000 mL | RESPIRATORY_TRACT | Status: DC | PRN

## 2015-05-02 MED ORDER — POTASSIUM CHLORIDE IN NACL 20-0.9 MEQ/L-% IV SOLN
INTRAVENOUS | Status: DC
  Administered 2015-05-02 (×2): via INTRAVENOUS

## 2015-05-02 NOTE — Care Management Note (Signed)
Case Management Note  Patient Details  Name: [name] MRN: [id number] Date of Birth: 24-May-1956  Expected Discharge Date:                  Expected Discharge Plan:  Home/Self Care  In-House Referral:  NA  Discharge planning Services  CM Consult  Post Acute Care Choice:  NA Choice offered to:  NA  DME Arranged:    DME Agency:     HH Arranged:    [place] Agency:     Status of Service:  Completed, signed off  Medicare Important Message Given:    Date Medicare IM Given:    Medicare IM give by:    Date Additional Medicare IM Given:    Additional Medicare Important Message give by:     If discussed at [place] of Stay Meetings, dates discussed:    Additional Comments: Pt is from home and independent. Pt has no HH services or DME's prior to admission. Pt plans to DC home with self care. No CM needs.   Sherald Barge, RN 05/02/2015, 2:34 PM

## 2015-05-02 NOTE — Progress Notes (Signed)
[place] for Vancomycin & Cefepime Indication: rule out sepsis  Allergies  Allergen Reactions  . Iohexol Anaphylaxis       . Latex Other (See Comments)    Causes dark spots on skin, but not pain, or irritation.   . Sulfa Antibiotics Rash    Minor Shaking   Patient Measurements: Height: 4\' 11"  (149.9 cm) Weight: 212 lb 15.4 oz (96.6 kg) IBW/kg (Calculated) : 43.2  Vital Signs: Temp: 98 F (36.7 C) (05/12 1122) Temp Source: Oral (05/12 1122) BP: 121/56 mmHg (05/12 1122) Pulse Rate: 113 (05/12 1122) Intake/Output from previous day: 05/11 0701 - 05/12 0700 In: 470.8 [I.V.:470.8] Out: -  Intake/Output from this shift:    Labs:  Recent Labs  05/01/15 2030 05/02/15 0547  WBC 14.6* 14.8*  HGB 11.7* 10.8*  PLT 333 328  CREATININE 1.08* 1.02*   Estimated Creatinine Clearance: 59.8 mL/min (by C-G formula based on Cr of 1.02). No results for input(s): VANCOTROUGH, VANCOPEAK, VANCORANDOM, GENTTROUGH, GENTPEAK, GENTRANDOM, TOBRATROUGH, TOBRAPEAK, TOBRARND, AMIKACINPEAK, AMIKACINTROU, AMIKACIN in the last 72 hours.   Microbiology: Recent Results (from the past 720 hour(s))  Blood Culture (routine x 2)     Status: None (Preliminary result)   Collection Time: 05/01/15  8:30 PM  Result Value Ref Range Status   Specimen Description BLOOD LEFT FOREARM  Final   Special Requests BOTTLES DRAWN AEROBIC AND ANAEROBIC 6CC  Final   Culture NO GROWTH 1 DAY  Final   Report Status PENDING  Incomplete  Blood Culture (routine x 2)     Status: None (Preliminary result)   Collection Time: 05/01/15  8:55 PM  Result Value Ref Range Status   Specimen Description BLOOD RIGHT HAND  Final   Special Requests BOTTLES DRAWN AEROBIC AND ANAEROBIC 6CC  Final   Culture NO GROWTH 1 DAY  Final   Report Status PENDING  Incomplete    Anti-infectives    Start     Dose/Rate Route Frequency Ordered Stop   05/02/15 1000  vancomycin (VANCOCIN) IVPB 750 mg/150 ml premix      750 mg 150 mL/hr over 60 Minutes Intravenous Every 12 hours 05/01/15 2302     05/02/15 0600  piperacillin-tazobactam (ZOSYN) IVPB 3.375 g  Status:  Discontinued     3.375 g 12.5 mL/hr over 240 Minutes Intravenous Every 8 hours 05/01/15 2302 05/02/15 0104   05/02/15 0100  oseltamivir (TAMIFLU) capsule 75 mg  Status:  Discontinued     75 mg Oral 2 times daily 05/02/15 0057 05/02/15 0707   05/02/15 0045  ceFEPIme (MAXIPIME) 2 g in dextrose 5 % 50 mL IVPB     2 g 100 mL/hr over 30 Minutes Intravenous  Once 05/02/15 0041 05/02/15 0140   05/02/15 0045  vancomycin (VANCOCIN) IVPB 1000 mg/200 mL premix  Status:  Discontinued     1,000 mg 200 mL/hr over 60 Minutes Intravenous  Once 05/02/15 0041 05/02/15 0150   05/01/15 2045  piperacillin-tazobactam (ZOSYN) IVPB 3.375 g     3.375 g 100 mL/hr over 30 Minutes Intravenous  Once 05/01/15 2044 05/01/15 2200   05/01/15 2045  vancomycin (VANCOCIN) IVPB 1000 mg/200 mL premix     1,000 mg 200 mL/hr over 60 Minutes Intravenous  Once 05/01/15 2044 05/01/15 2257     Assessment: 59 yo F who presented with fever (Tm 104.10F).  Empiric, broad-spectrum antibiotics initiated in ED for sepsis.  Zosyn being switched to Cefepime.   Estimated CrCl ~ 21ml/min.   Goal  of Therapy:  Vancomycin trough level 15-20 mcg/ml  Plan:  Cefepime 1gm IV q8h Vancomycin 750mg  IV q12h Check Vancomycin trough at steady state Monitor renal function and cx data   Hart Robinsons A 05/02/2015,12:44 PM

## 2015-05-02 NOTE — Progress Notes (Signed)
[facility] PROGRESS NOTE  [name] JJK:[id number] DOB: 18-Apr-1956 DOA: 05/01/2015 PCP: Robert Bellow, MD  Assessment/Plan: 1. Sepsis -Present on admission, evidenced by temperature of 104.6, heart rate of 149, respiratory rate of 34, with labs showing a white count of 14,600. Suspect source of infection may be coming from lungs given patient's respiratory failure.  -Chest x-ray however did not show acute cardiopulmonary abnormality and she had a negative flu swab. Urinalysis was unrevealing. Blood culture showing no growth after overnight intubation. -Plan to repeat chest x-ray in a.m., meanwhile continue broad-spectrum empiric antimicrobial therapy with cefepime and vancomycin, follow-up on blood cultures in a.m. She remains hemodynamically stable.  2.  Acute hypoxemic respiratory failure -Evidence by patient presenting in respiratory distress having respiratory rate of 34. Initial chest x-ray did not show acute infiltrate. I think it's possible this could be related to viral infection. Patient reporting feeling better this morning with fevers coming down. -Will repeat chest x-ray in a.m.  3.  Hypertension -Blood pressure stable having last blood pressure 121/56  Code Status: Full code Family Communication: I spoke with his daughter updated her at bedside Disposition Plan: Continue supportive care   Antibiotics:  Vancomycin  Cefepime  HPI/Subjective: Patient reporting feeling better, fevers coming down, tolerating PO .   Objective: Filed Vitals:   05/02/15 1122  BP: 121/56  Pulse: 113  Temp: 98 F (36.7 C)  Resp: 16    Intake/Output Summary (Last 24 hours) at 05/02/15 1521 Last data filed at 05/02/15 1400  Gross per 24 hour  Intake 1491.66 ml  Output      0 ml  Net 1491.66 ml   Filed Weights   05/01/15 2007 05/02/15 0148  Weight: 90.266 kg (199 lb) 96.6 kg (212 lb 15.4 oz)    Exam:   General:  Patient is awake and alert, states feeling  better, nontoxic appearing  Cardiovascular: Tachycardic, regular rate rhythm normal S1-S2  Respiratory: Normal S1-S2 no murmurs rubs gallop  Abdomen: Soft nontender nondistended  Musculoskeletal: No edema  Data Reviewed: Basic Metabolic Panel:  Recent Labs Lab 05/01/15 2030 05/02/15 0547  NA 135 142  K 3.4* 4.1  CL 105 111  CO2 22 23  GLUCOSE 143* 155*  BUN 12 12  CREATININE 1.08* 1.02*  CALCIUM 9.6 9.2   Liver Function Tests:  Recent Labs Lab 05/01/15 2030 05/02/15 0547  AST 24 24  ALT 14 16  ALKPHOS 105 84  BILITOT 0.5 0.6  PROT 7.5 6.9  ALBUMIN 3.9 3.5   No results for input(s): LIPASE, AMYLASE in the last 168 hours. No results for input(s): AMMONIA in the last 168 hours. CBC:  Recent Labs Lab 05/01/15 2030 05/02/15 0547  WBC 14.6* 14.8*  NEUTROABS 11.9* 12.7*  HGB 11.7* 10.8*  HCT 36.2 34.3*  MCV 81.2 82.7  PLT 333 328   Cardiac Enzymes: No results for input(s): CKTOTAL, CKMB, CKMBINDEX, TROPONINI in the last 168 hours. BNP (last 3 results) No results for input(s): BNP in the last 8760 hours.  ProBNP (last 3 results) No results for input(s): PROBNP in the last 8760 hours.  CBG: No results for input(s): GLUCAP in the last 168 hours.  Recent Results (from the past 240 hour(s))  Blood Culture (routine x 2)     Status: None (Preliminary result)   Collection Time: 05/01/15  8:30 PM  Result Value Ref Range Status   Specimen Description BLOOD LEFT FOREARM  Final   Special Requests BOTTLES DRAWN AEROBIC AND ANAEROBIC 6CC  Final   Culture NO GROWTH 1 DAY  Final   Report Status PENDING  Incomplete  Blood Culture (routine x 2)     Status: None (Preliminary result)   Collection Time: 05/01/15  8:55 PM  Result Value Ref Range Status   Specimen Description BLOOD RIGHT HAND  Final   Special Requests BOTTLES DRAWN AEROBIC AND ANAEROBIC 6CC  Final   Culture NO GROWTH 1 DAY  Final   Report Status PENDING  Incomplete     Studies: Dg Chest Port 1  View  05/01/2015   CLINICAL DATA:  Sepsis, fever.  EXAM: PORTABLE CHEST - 1 VIEW  COMPARISON:  February 16, 2013.  FINDINGS: The heart size and mediastinal contours are within normal limits. Hypoinflation of the lungs is noted. No pneumothorax or pleural effusion is noted. Left lung is clear. Stable right basilar density is noted consistent with scarring. No acute pulmonary disease is noted. The visualized skeletal structures are unremarkable.  IMPRESSION: No acute cardiopulmonary abnormality seen.   Electronically Signed   By: Marijo Conception, M.D.   On: 05/01/2015 21:19    Scheduled Meds: . ceFEPime (MAXIPIME) IV  1 g Intravenous 3 times per day  . heparin  5,000 Units Subcutaneous 3 times per day  . ipratropium-albuterol  3 mL Nebulization Q4H  . methylPREDNISolone (SOLU-MEDROL) injection  60 mg Intravenous Q6H  . vancomycin  750 mg Intravenous Q12H   Continuous Infusions: . 0.9 % NaCl with KCl 20 mEq / L 125 mL/hr at 05/02/15 0204    Active Problems:   Sepsis   Acute respiratory failure    Time spent:     Kelvin Cellar  [facility] Pager [phone]. If 7PM-7AM, please contact night-coverage at [email or web address], password [facility] 05/02/2015, 3:21 PM

## 2015-05-02 NOTE — H&P (Signed)
Hospitalist Admission History and Physical  Patient name: [name] Medical record number: [id number] Date of birth: 1956-05-05 Age: 59 y.o. Gender: female  Primary Care Provider: Robert Bellow, MD  Chief Complaint: sepsis, acute resp failure  History of Present Illness:This is a 59 y.o. year old female with significant past medical history of asthma, HTN, diverticulosis presenting with sepsis, acute resp failure. Patient ports generalized malaise, increased cough and wheezing since 2 days ago. Positive history of subjective fevers and chills. Positive history of asthma. Noted increase use of albuterol over this timeframe. No flu shot this year. Denies any known sick contacts. Nonsmoker. Presented to the ER Tmax of 104.6, heart rate in the 110s to 150s, respirations in the tens to 30s, blood pressure in the 110s to 160s, satting 97% on room air. White blood cell count 14.6, hemoglobin 11.7, creatinine 1.08, potassium 3.4. Lactate 1.9. Flu negative.  CXR negative. Started on vanc and zosyn empirically.    Assessment and Plan: [name] is a 59 y.o. year old female presenting with sepsis, acute resp failure   Active Problems:   Sepsis   Acute respiratory failure   1-Sepsis -meets criteria by T, HR, RR, WBC -suspect resp source -sxs clinically consistent w/ flu like illness and concominant asthma flare -mild resp distress at present  -IV vanc and cefepime -tamiflu -resp virus panel  -IV solumedrol  -duonebs -lactate WNL at present-trend -ABG -supplemental O2 at present  2- Acute resp failure -likely secondary to above -no infiltrate on imaging -treat as above -follow  3-HTN -BP stable  -cont home regimen   FEN/GI: NPO for now. Likely restart diet in am  Prophylaxis: sub q heparin  Disposition: pending further evaluation  Code Status:Full Code    Patient Active Problem List   Diagnosis Date Noted  . Sepsis 05/02/2015  . Acute respiratory failure  05/02/2015   Past Medical History: Past Medical History  Diagnosis Date  . Hypertension   . Asthma   . Diverticulitis     Past Surgical History: Past Surgical History  Procedure Laterality Date  . Cholecystectomy    . Ablation colpoclesis      Social History: History   Social History  . Marital Status: Widowed    Spouse Name: N/A  . Number of Children: N/A  . Years of Education: N/A   Social History Main Topics  . Smoking status: Never Smoker   . Smokeless tobacco: Never Used  . Alcohol Use: No  . Drug Use: No  . Sexual Activity: Not on file   Other Topics Concern  . None   Social History Narrative    Family History: Family History  Problem Relation Age of Onset  . Emphysema Mother   . Stroke Father   . Hypertension Father   . Diabetes Sister   . Hypertension Sister   . Stroke Brother   . Hypertension Brother     Allergies: Allergies  Allergen Reactions  . Iohexol Anaphylaxis       . Latex Other (See Comments)    Causes dark spots on skin, but not pain, or irritation.   . Sulfa Antibiotics Rash    Minor Shaking    Current Facility-Administered Medications  Medication Dose Route Frequency Provider Last Rate Last Dose  . 0.9 % NaCl with KCl 20 mEq/ L  infusion   Intravenous Continuous Deneise Lever, MD      . ceFEPIme (MAXIPIME) 2 g in dextrose 5 % 50 mL IVPB  2 g Intravenous Once Deneise Lever, MD      . heparin injection 5,000 Units  5,000 Units Subcutaneous 3 times per day Deneise Lever, MD      . ipratropium-albuterol (DUONEB) 0.5-2.5 (3) MG/3ML nebulizer solution 3 mL  3 mL Nebulization Q4H Deneise Lever, MD      . ipratropium-albuterol (DUONEB) 0.5-2.5 (3) MG/3ML nebulizer solution 3 mL  3 mL Nebulization Q2H PRN Deneise Lever, MD      . methylPREDNISolone sodium succinate (SOLU-MEDROL) 125 mg/2 mL injection 125 mg  125 mg Intravenous Q12H Deneise Lever, MD      . piperacillin-tazobactam (ZOSYN) IVPB 3.375 g  3.375 g Intravenous  Q8H Thomes Lolling, RPH      . vancomycin (VANCOCIN) IVPB 1000 mg/200 mL premix  1,000 mg Intravenous Once Deneise Lever, MD      . vancomycin (VANCOCIN) IVPB 750 mg/150 ml premix  750 mg Intravenous Q12H Thomes Lolling, [facility]       Current Outpatient Prescriptions  Medication Sig Dispense Refill  . albuterol (PROVENTIL HFA;VENTOLIN HFA) 108 (90 BASE) MCG/ACT inhaler Inhale 2 puffs into the lungs every 6 (six) hours as needed. Asthma    . triamterene-hydrochlorothiazide (MAXZIDE) 75-50 MG per tablet Take 0.5 tablets by mouth daily.    . famotidine (PEPCID) 20 MG tablet Take 1 tablet (20 mg total) by mouth 2 (two) times daily. (Patient not taking: Reported on 05/01/2015) 14 tablet 0  . ondansetron (ZOFRAN) 4 MG tablet Take 2 tablets (8 mg total) by mouth every 8 (eight) hours as needed for nausea. (Patient not taking: Reported on 05/01/2015) 12 tablet 0  . predniSONE (DELTASONE) 50 MG tablet One tab daily for 7 days (Patient not taking: Reported on 05/01/2015) 7 tablet 0   Review Of Systems: 12 point ROS negative except as noted above in HPI.  Physical Exam: Filed Vitals:   05/02/15 0000  BP: 138/81  Pulse: 113  Temp:   Resp: 34    General: alert and cooperative HEENT: PERRLA and extra ocular movement intact Heart: S1, S2 normal, no murmur, rub or gallop, regular rate and rhythm Lungs:mild increased WOB, + wheezes  Abdomen: abdomen is soft without significant tenderness, masses, organomegaly or guarding Extremities: extremities normal, atraumatic, no cyanosis or edema Skin:no rashes Neurology: normal without focal findings  Labs and Imaging: Lab Results  Component Value Date/Time   NA 135 05/01/2015 08:30 PM   K 3.4* 05/01/2015 08:30 PM   CL 105 05/01/2015 08:30 PM   CO2 22 05/01/2015 08:30 PM   BUN 12 05/01/2015 08:30 PM   CREATININE 1.08* 05/01/2015 08:30 PM   GLUCOSE 143* 05/01/2015 08:30 PM   Lab Results  Component Value Date   WBC 14.6* 05/01/2015   HGB 11.7*  05/01/2015   HCT 36.2 05/01/2015   MCV 81.2 05/01/2015   PLT 333 05/01/2015   Urinalysis    Component Value Date/Time   COLORURINE YELLOW 05/01/2015 2127   APPEARANCEUR CLEAR 05/01/2015 2127   LABSPEC 1.020 05/01/2015 2127   PHURINE 6.0 05/01/2015 2127   [place] 05/01/2015 2127   HGBUR NEGATIVE 05/01/2015 2127   BILIRUBINUR NEGATIVE 05/01/2015 2127   KETONESUR NEGATIVE 05/01/2015 2127   PROTEINUR NEGATIVE 05/01/2015 2127   UROBILINOGEN 0.2 05/01/2015 2127   NITRITE NEGATIVE 05/01/2015 2127   LEUKOCYTESUR TRACE* 05/01/2015 2127       Dg Chest Port 1 View  05/01/2015   CLINICAL DATA:  Sepsis, fever.  EXAM: PORTABLE CHEST -  1 VIEW  COMPARISON:  February 16, 2013.  FINDINGS: The heart size and mediastinal contours are within normal limits. Hypoinflation of the lungs is noted. No pneumothorax or pleural effusion is noted. Left lung is clear. Stable right basilar density is noted consistent with scarring. No acute pulmonary disease is noted. The visualized skeletal structures are unremarkable.  IMPRESSION: No acute cardiopulmonary abnormality seen.   Electronically Signed   By: Marijo Conception, M.D.   On: 05/01/2015 21:19           Shanda Howells MD  Pager: [phone]

## 2015-05-03 ENCOUNTER — Inpatient Hospital Stay (HOSPITAL_COMMUNITY): Payer: No Typology Code available for payment source

## 2015-05-03 DIAGNOSIS — I10 Essential (primary) hypertension: Secondary | ICD-10-CM

## 2015-05-03 DIAGNOSIS — A419 Sepsis, unspecified organism: Principal | ICD-10-CM

## 2015-05-03 DIAGNOSIS — J9601 Acute respiratory failure with hypoxia: Secondary | ICD-10-CM

## 2015-05-03 LAB — COMPREHENSIVE METABOLIC PANEL
ALBUMIN: 3 g/dL — AB (ref 3.5–5.0)
ALK PHOS: 82 U/L (ref 38–126)
ALT: 15 U/L (ref 14–54)
AST: 19 U/L (ref 15–41)
Anion gap: 6 (ref 5–15)
BILIRUBIN TOTAL: 0.3 mg/dL (ref 0.3–1.2)
BUN: 15 mg/dL (ref 6–20)
CO2: 21 mmol/L — AB (ref 22–32)
Calcium: 9.3 mg/dL (ref 8.9–10.3)
Chloride: 115 mmol/L — ABNORMAL HIGH (ref 101–111)
Creatinine, Ser: 0.91 mg/dL (ref 0.44–1.00)
GFR calc Af Amer: >60 mL/min (ref >60)
GFR calc non Af Amer: >60 mL/min (ref >60)
GLUCOSE: 184 mg/dL — AB (ref 65–99)
POTASSIUM: 4.5 mmol/L (ref 3.5–5.1)
Sodium: 142 mmol/L (ref 135–145)
Total Protein: 6.5 g/dL (ref 6.5–8.1)

## 2015-05-03 LAB — CBC WITH DIFFERENTIAL/PLATELET
Basophils Absolute: 0 10*3/uL (ref 0.0–0.1)
Basophils Relative: 0 % (ref 0–1)
Eosinophils Absolute: 0 10*3/uL (ref 0.0–0.7)
Eosinophils Relative: 0 % (ref 0–5)
HCT: 32.5 % — ABNORMAL LOW (ref 36.0–46.0)
HEMOGLOBIN: 10.2 g/dL — AB (ref 12.0–15.0)
LYMPHS ABS: 1.2 10*3/uL (ref 0.7–4.0)
LYMPHS PCT: 8 % — AB (ref 12–46)
MCH: 26 pg (ref 26.0–34.0)
MCHC: 31.4 g/dL (ref 30.0–36.0)
MCV: 82.7 fL (ref 78.0–100.0)
Monocytes Absolute: 1 10*3/uL (ref 0.1–1.0)
Monocytes Relative: 6 % (ref 3–12)
NEUTROS ABS: 14.1 10*3/uL — AB (ref 1.7–7.7)
Neutrophils Relative %: 86 % — ABNORMAL HIGH (ref 43–77)
PLATELETS: 333 10*3/uL (ref 150–400)
RBC: 3.93 MIL/uL (ref 3.87–5.11)
RDW: 16.6 % — ABNORMAL HIGH (ref 11.5–15.5)
WBC: 16.3 10*3/uL — AB (ref 4.0–10.5)

## 2015-05-03 MED ORDER — LEVALBUTEROL HCL 0.63 MG/3ML IN NEBU
0.6300 mg | INHALATION_SOLUTION | Freq: Four times a day (QID) | RESPIRATORY_TRACT | Status: DC
  Filled 2015-05-03: qty 3

## 2015-05-03 MED ORDER — TEMAZEPAM 15 MG PO CAPS
15.0000 mg | ORAL_CAPSULE | Freq: Every evening | ORAL | Status: DC | PRN
  Administered 2015-05-03: 15 mg via ORAL
  Filled 2015-05-03: qty 1

## 2015-05-03 MED ORDER — AMOXICILLIN-POT CLAVULANATE 875-125 MG PO TABS
1.0000 | ORAL_TABLET | Freq: Two times a day (BID) | ORAL | Status: DC
  Administered 2015-05-03: 1 via ORAL
  Filled 2015-05-03: qty 1

## 2015-05-03 MED ORDER — ALPRAZOLAM 0.25 MG PO TABS
0.2500 mg | ORAL_TABLET | Freq: Once | ORAL | Status: AC
  Administered 2015-05-03: 0.25 mg via ORAL
  Filled 2015-05-03: qty 1

## 2015-05-03 MED ORDER — ACETAMINOPHEN 325 MG PO TABS: 650.0000 mg | ORAL_TABLET | Freq: Four times a day (QID) | ORAL | Status: DC | PRN

## 2015-05-03 NOTE — Progress Notes (Signed)
Patient loss of both IV access due to infiltration. IV nurse attempted IV without success. Patient refuses IV at this time. Dr. Coralyn Pear notified.

## 2015-05-03 NOTE — Progress Notes (Signed)
Progress Note  Patient losing IV access today and refusing IV placement. She has been afebrile throughout the day, reporting feeling better and tolerating PO intake. Blood cultures showing no growth to date. Will transition to oral antimicrobial therapy with Augmentin and see how she does overnight. Encourage oral intake.

## 2015-05-03 NOTE — Evaluation (Signed)
Physical Therapy Evaluation Patient Details Name: [name] MRN: [id number] DOB: 17-Dec-1956 Today's Date: 05/03/2015   History of Present Illness  Pt is a 59 year old female with acute respiratory failure and sepsis.  She has a history of asthma and is morbidly obese.  She lives alone and is normally independent with all ADLs.  Clinical Impression  Pt was seen for evaluation.  She was alert and oriented, reports feeling much better and anxious to return home.  She is breathing on room air with O2 sat at 96%.  Her strength and balance are WNL.  She is able to ambulate independently maintaining a slow pace.  O2 sat did not drop below 93%.  She was able to ascend/descend a flight of steps with good stability.  No further PT should be needed.    Follow Up Recommendations No PT follow up    Equipment Recommendations  None recommended by PT    Recommendations for Other Services   none    Precautions / Restrictions Precautions Precautions: None Restrictions Weight Bearing Restrictions: No      Mobility  Bed Mobility Overal bed mobility: Independent                Transfers Overall transfer level: Independent                  Ambulation/Gait Ambulation/Gait assistance: Independent Ambulation Distance (Feet): 250 Feet Assistive device: None Gait Pattern/deviations: WFL(Within Functional Limits) Gait velocity: O2 sat remained at 93% or above Gait velocity interpretation: Below normal speed for age/gender    Stairs Stairs: Yes Stairs assistance: Modified independent (Device/Increase time) Stair Management: One rail Right Number of Stairs: 10 General stair comments: pt becomes fearful when descending steps, this is her norm.Marland KitchenMarland KitchenI have recommended that she do a "step to " pattern and keep her gaze at the horizon.  Wheelchair Mobility    Modified Rankin (Stroke Patients Only)       Balance Overall balance assessment: Modified Independent                                            Pertinent Vitals/Pain Pain Assessment: No/denies pain    Home Living Family/patient expects to be discharged to:: Private residence Living Arrangements: Alone Available Help at Discharge: Family;Available PRN/intermittently Type of Home: Mobile home Home Access: Stairs to enter Entrance Stairs-Rails: Right Entrance Stairs-Number of Steps: 4 Home Layout: One level Home Equipment: Walker - 2 wheels;Cane - single point      Prior Function Level of Independence: Independent               Hand Dominance        Extremity/Trunk Assessment   Upper Extremity Assessment: Overall WFL for tasks assessed           Lower Extremity Assessment: Overall WFL for tasks assessed         Communication   Communication: No difficulties  Cognition Arousal/Alertness: Awake/alert Behavior During Therapy: WFL for tasks assessed/performed Overall Cognitive Status: Within Functional Limits for tasks assessed                      General Comments      Exercises        Assessment/Plan    PT Assessment Patent does not need any further PT services  PT Diagnosis     PT  Problem List    PT Treatment Interventions     PT Goals (Current goals can be found in the Care Plan section) Acute Rehab PT Goals PT Goal Formulation: All assessment and education complete, DC therapy    Frequency     Barriers to discharge        Co-evaluation               End of Session Equipment Utilized During Treatment: Gait belt Activity Tolerance: Patient tolerated treatment well Patient left: in bed;with call bell/phone within reach;with family/visitor present Nurse Communication: Mobility status         Time: 0479-9872 PT Time Calculation (min) (ACUTE ONLY): 32 min   Charges:   PT Evaluation $Initial PT Evaluation Tier I: 1 Procedure     PT G CodesDemetrios Isaacs L 05/03/2015, 3:33 PM

## 2015-05-03 NOTE — Progress Notes (Addendum)
[facility] PROGRESS NOTE  [name] HYQ:[id number] DOB: 07/04/1956 DOA: 05/01/2015 PCP: Robert Bellow, MD  Assessment/Plan: 1. Sepsis -Present on admission, evidenced by temperature of 104.6, heart rate of 149, respiratory rate of 34, with labs showing a white count of 14,600. Suspect source of infection may be coming from lungs given patient's respiratory failure.  -Chest x-ray however did not show acute cardiopulmonary abnormality and she had a negative flu swab. Urinalysis was unrevealing. Blood culture showing no growth after overnight intubation. -Awaiting repeat chest x-ray this a.m. -Blood cultures drawn on 05/01/2015 showing no growth to date, meanwhile continue broad-spectrum empiric antimicrobial therapy with cefepime and vancomycin. I noted WBC's on CBC to trend up to 16,300 from 14,800 on this morning's labs which could be reactive from IV steroids. . Will follow up on repeat CXR. I this this could be related to viral syndrome.  -Will stop IV steroids.   2.  Acute hypoxemic respiratory failure -Evidence by patient presenting in respiratory distress having respiratory rate of 34. Initial chest x-ray did not show acute infiltrate. I think it's possible this could be related to viral infection. Flu swab was negative.  -Respiratory status improved.   3.  Hypertension -Blood pressure stable having last blood pressure 118/63  Code Status: Full code Family Communication: I spoke with his daughter updated her at bedside Disposition Plan: Continue supportive care   Antibiotics:  Vancomycin  Cefepime  HPI/Subjective: Patient reporting feeling better today, she was assisted out of bed to chair.   Objective: Filed Vitals:   05/03/15 0517  BP: 118/63  Pulse: 93  Temp: 98.3 F (36.8 C)  Resp: 18    Intake/Output Summary (Last 24 hours) at 05/03/15 1015 Last data filed at 05/03/15 0520  Gross per 24 hour  Intake   2700 ml  Output    700 ml  Net   2000 ml    Filed Weights   05/01/15 2007 05/02/15 0148  Weight: 90.266 kg (199 lb) 96.6 kg (212 lb 15.4 oz)    Exam:   General:  Patient is awake and alert, states feeling better, nontoxic appearing  Cardiovascular: Tachycardic, regular rate rhythm normal S1-S2  Respiratory: Normal S1-S2 no murmurs rubs gallop  Abdomen: Soft nontender nondistended  Musculoskeletal: No edema  Data Reviewed: Basic Metabolic Panel:  Recent Labs Lab 05/01/15 2030 05/02/15 0547 05/03/15 0552  NA 135 142 142  K 3.4* 4.1 4.5  CL 105 111 115*  CO2 22 23 21*  GLUCOSE 143* 155* 184*  BUN 12 12 15   CREATININE 1.08* 1.02* 0.91  CALCIUM 9.6 9.2 9.3   Liver Function Tests:  Recent Labs Lab 05/01/15 2030 05/02/15 0547 05/03/15 0552  AST 24 24 19   ALT 14 16 15   ALKPHOS 105 84 82  BILITOT 0.5 0.6 0.3  PROT 7.5 6.9 6.5  ALBUMIN 3.9 3.5 3.0*   No results for input(s): LIPASE, AMYLASE in the last 168 hours. No results for input(s): AMMONIA in the last 168 hours. CBC:  Recent Labs Lab 05/01/15 2030 05/02/15 0547 05/03/15 0552  WBC 14.6* 14.8* 16.3*  NEUTROABS 11.9* 12.7* 14.1*  HGB 11.7* 10.8* 10.2*  HCT 36.2 34.3* 32.5*  MCV 81.2 82.7 82.7  PLT 333 328 333   Cardiac Enzymes: No results for input(s): CKTOTAL, CKMB, CKMBINDEX, TROPONINI in the last 168 hours. BNP (last 3 results) No results for input(s): BNP in the last 8760 hours.  ProBNP (last 3 results) No results for input(s): PROBNP in the last 8760  hours.  CBG: No results for input(s): GLUCAP in the last 168 hours.  Recent Results (from the past 240 hour(s))  Blood Culture (routine x 2)     Status: None (Preliminary result)   Collection Time: 05/01/15  8:30 PM  Result Value Ref Range Status   Specimen Description BLOOD LEFT FOREARM  Final   Special Requests BOTTLES DRAWN AEROBIC AND ANAEROBIC 6CC  Final   Culture NO GROWTH 2 DAYS  Final   Report Status PENDING  Incomplete  Blood Culture (routine x 2)     Status: None  (Preliminary result)   Collection Time: 05/01/15  8:55 PM  Result Value Ref Range Status   Specimen Description BLOOD RIGHT HAND  Final   Special Requests BOTTLES DRAWN AEROBIC AND ANAEROBIC 6CC  Final   Culture NO GROWTH 2 DAYS  Final   Report Status PENDING  Incomplete     Studies: Dg Chest Port 1 View  05/01/2015   CLINICAL DATA:  Sepsis, fever.  EXAM: PORTABLE CHEST - 1 VIEW  COMPARISON:  February 16, 2013.  FINDINGS: The heart size and mediastinal contours are within normal limits. Hypoinflation of the lungs is noted. No pneumothorax or pleural effusion is noted. Left lung is clear. Stable right basilar density is noted consistent with scarring. No acute pulmonary disease is noted. The visualized skeletal structures are unremarkable.  IMPRESSION: No acute cardiopulmonary abnormality seen.   Electronically Signed   By: Marijo Conception, M.D.   On: 05/01/2015 21:19    Scheduled Meds: . ceFEPime (MAXIPIME) IV  1 g Intravenous 3 times per day  . heparin  5,000 Units Subcutaneous 3 times per day  . methylPREDNISolone (SOLU-MEDROL) injection  60 mg Intravenous Q6H  . vancomycin  750 mg Intravenous Q12H   Continuous Infusions: . 0.9 % NaCl with KCl 20 mEq / L 75 mL/hr at 05/02/15 2129    Active Problems:   Sepsis   Acute respiratory failure    Time spent: 30 min    Kelvin Cellar  [facility] Pager [phone]. If 7PM-7AM, please contact night-coverage at [email or web address], password [email or web address] 05/03/2015, 10:15 AM  LOS: 1 day

## 2015-05-04 DIAGNOSIS — J189 Pneumonia, unspecified organism: Secondary | ICD-10-CM

## 2015-05-04 LAB — COMPREHENSIVE METABOLIC PANEL
ALT: 17 U/L (ref 14–54)
AST: 19 U/L (ref 15–41)
Albumin: 2.9 g/dL — ABNORMAL LOW (ref 3.5–5.0)
Alkaline Phosphatase: 91 U/L (ref 38–126)
Anion gap: 4 — ABNORMAL LOW (ref 5–15)
BUN: 23 mg/dL — AB (ref 6–20)
CO2: 25 mmol/L (ref 22–32)
Calcium: 9.3 mg/dL (ref 8.9–10.3)
Chloride: 115 mmol/L — ABNORMAL HIGH (ref 101–111)
Creatinine, Ser: 0.97 mg/dL (ref 0.44–1.00)
Glucose, Bld: 105 mg/dL — ABNORMAL HIGH (ref 65–99)
POTASSIUM: 4 mmol/L (ref 3.5–5.1)
Sodium: 144 mmol/L (ref 135–145)
Total Bilirubin: 0.5 mg/dL (ref 0.3–1.2)
Total Protein: 5.8 g/dL — ABNORMAL LOW (ref 6.5–8.1)

## 2015-05-04 LAB — CBC WITH DIFFERENTIAL/PLATELET
BASOS ABS: 0 10*3/uL (ref 0.0–0.1)
Basophils Relative: 0 % (ref 0–1)
EOS ABS: 0 10*3/uL (ref 0.0–0.7)
EOS PCT: 0 % (ref 0–5)
HCT: 31.4 % — ABNORMAL LOW (ref 36.0–46.0)
HEMOGLOBIN: 10.1 g/dL — AB (ref 12.0–15.0)
Lymphocytes Relative: 13 % (ref 12–46)
Lymphs Abs: 1.7 10*3/uL (ref 0.7–4.0)
MCH: 26.6 pg (ref 26.0–34.0)
MCHC: 32.2 g/dL (ref 30.0–36.0)
MCV: 82.6 fL (ref 78.0–100.0)
Monocytes Absolute: 1 10*3/uL (ref 0.1–1.0)
Monocytes Relative: 7 % (ref 3–12)
Neutro Abs: 11.1 10*3/uL — ABNORMAL HIGH (ref 1.7–7.7)
Neutrophils Relative %: 80 % — ABNORMAL HIGH (ref 43–77)
Platelets: 349 10*3/uL (ref 150–400)
RBC: 3.8 MIL/uL — AB (ref 3.87–5.11)
RDW: 16.8 % — ABNORMAL HIGH (ref 11.5–15.5)
WBC: 13.9 10*3/uL — AB (ref 4.0–10.5)

## 2015-05-04 MED ORDER — LEVOFLOXACIN 750 MG PO TABS: 750.0000 mg | ORAL_TABLET | Freq: Every day | ORAL | Status: DC

## 2015-05-04 NOTE — Discharge Summary (Signed)
Physician Discharge Summary  [name] GYI:[id number] DOB: [birth year] DOA: 05/01/2015  PCP: Robert Bellow, MD  Admit date: 05/01/2015 Discharge date: 05/04/2015  Time spent: 35 minutes  Recommendations for Outpatient Follow-up:  1. Patient treated for PNA during this hospitalization, discharged on Levaquin, please follow up on pulmonary status  Discharge Diagnoses:  Principal Problem:   CAP (community acquired pneumonia) Active Problems:   Sepsis   Acute respiratory failure   Discharge Condition: Stable  Diet recommendation: Heart Healthy  Filed Weights   05/01/15 2007 05/02/15 0148  Weight: 90.266 kg (199 lb) 96.6 kg (212 lb 15.4 oz)    History of present illness:  History of Present Illness:This is a 59 y.o. year old female with significant past medical history of asthma, HTN, diverticulosis presenting with sepsis, acute resp failure. Patient ports generalized malaise, increased cough and wheezing since 2 days ago. Positive history of subjective fevers and chills. Positive history of asthma. Noted increase use of albuterol over this timeframe. No flu shot this year. Denies any known sick contacts. Nonsmoker. Presented to the ER Tmax of 104.6, heart rate in the 110s to 150s, respirations in the tens to 30s, blood pressure in the 110s to 160s, satting 97% on room air. White blood cell count 14.6, hemoglobin 11.7, creatinine 1.08, potassium 3.4. Lactate 1.9. Flu negative. CXR negative. Started on vanc and zosyn empirically.   Hospital Course:  Patient is a pleasant 59 year old female with a past medical history of hypertension, asthma, presented to the emergency department on 05/02/2015 with complaints of cough associate with shortness of breath, fevers, chills, malaise, fatigue. In the emergency room she was found to be febrile having a temperature 104.6, tachycardic with heart rates in the 120s and respiratory rate of 30. She was found to be septic with suspected source of  infection lungs. Chest x-ray on admission did not show acute infiltrate. Initially it was suspected that this could reflect viral infection. Flu swab came back negative. She was started on broad-spectrum IV antimicrobial therapy with vancomycin and Zosyn. Blood cultures were obtained and remained negative during this hospitalization. A repeat chest x-ray performed on 05/03/2015 did reveal posterior left lower lobe infiltrate. Given significant clinical improvements and patient remaining afebrile for at least 48 hours she was transitioned to oral antibiotic therapy. By 05/04/2015 she was ambulating down the hallway, stated feeling significantly better, tolerating by mouth intake and felt well enough to go home. Charged on Levaquin 750 mg by mouth daily 5 days.    Discharge Exam: Filed Vitals:   05/04/15 0616  BP: 133/73  Pulse: 88  Temp: 97.9 F (36.6 C)  Resp: 18    General: patient is in no acute distress, walking down the hallway, states feeling much better. Cardiovascular: regular rate and rhythm normal S1-S2 no murmurs rubs or gallops Respiratory: clear to auscultation bilaterally no wheezing rhonchi or rales  Abdomen: Soft nontender nondistended positive bowel sounds   Discharge Instructions   Discharge Instructions    Call MD for:  difficulty breathing, headache or visual disturbances    Complete by:  As directed      Call MD for:  extreme fatigue    Complete by:  As directed      Call MD for:  hives    Complete by:  As directed      Call MD for:  persistant dizziness or light-headedness    Complete by:  As directed      Call MD for:  persistant nausea and  vomiting    Complete by:  As directed      Call MD for:  redness, tenderness, or signs of infection (pain, swelling, redness, odor or green/yellow discharge around incision site)    Complete by:  As directed      Call MD for:  severe uncontrolled pain    Complete by:  As directed      Call MD for:  temperature >100.4     Complete by:  As directed      Diet - low sodium heart healthy    Complete by:  As directed      Increase activity slowly    Complete by:  As directed           Current Discharge Medication List    START taking these medications   Details  levofloxacin (LEVAQUIN) 750 MG tablet Take 1 tablet (750 mg total) by mouth daily. Qty: 5 tablet, Refills: 0      CONTINUE these medications which have NOT CHANGED   Details  albuterol (PROVENTIL HFA;VENTOLIN HFA) 108 (90 BASE) MCG/ACT inhaler Inhale 2 puffs into the lungs every 6 (six) hours as needed. Asthma    triamterene-hydrochlorothiazide (MAXZIDE) 75-50 MG per tablet Take 0.5 tablets by mouth daily.    famotidine (PEPCID) 20 MG tablet Take 1 tablet (20 mg total) by mouth 2 (two) times daily. Qty: 14 tablet, Refills: 0    ondansetron (ZOFRAN) 4 MG tablet Take 2 tablets (8 mg total) by mouth every 8 (eight) hours as needed for nausea. Qty: 12 tablet, Refills: 0    predniSONE (DELTASONE) 50 MG tablet One tab daily for 7 days Qty: 7 tablet, Refills: 0       Allergies  Allergen Reactions  . Iohexol Anaphylaxis       . Latex Other (See Comments)    Causes dark spots on skin, but not pain, or irritation.   . Sulfa Antibiotics Rash    Minor Shaking   Follow-up Information    Follow up with Robert Bellow, MD In 2 weeks.   Specialty:  Family Medicine   Contact information:   [place] [address] [phone]        The results of significant diagnostics from this hospitalization (including imaging, microbiology, ancillary and laboratory) are listed below for reference.    Significant Diagnostic Studies: Dg Chest 2 View  05/03/2015   CLINICAL DATA:  Pneumonia.  Cough.  Sepsis.  EXAM: CHEST  2 VIEW  COMPARISON:  05/01/2015 and 12/26/2012  FINDINGS: New pulmonary infiltrate is seen in the posterior left lower lobe, suspicious for pneumonia. No evidence of pleural effusion.  Scarring in the right middle  lobe appears stable. Heart size is also stable and within normal limits allowing for low lung volumes.  IMPRESSION: New posterior left lower lobe infiltrate, suspicious for pneumonia. No evidence pleural effusion.  Stable appearance of right middle lobe scarring.   Electronically Signed   By: Earle Gell M.D.   On: 05/03/2015 13:20   Dg Chest Port 1 View  05/01/2015   CLINICAL DATA:  Sepsis, fever.  EXAM: PORTABLE CHEST - 1 VIEW  COMPARISON:  February 16, 2013.  FINDINGS: The heart size and mediastinal contours are within normal limits. Hypoinflation of the lungs is noted. No pneumothorax or pleural effusion is noted. Left lung is clear. Stable right basilar density is noted consistent with scarring. No acute pulmonary disease is noted. The visualized skeletal structures are unremarkable.  IMPRESSION: No acute cardiopulmonary  abnormality seen.   Electronically Signed   By: Marijo Conception, M.D.   On: 05/01/2015 21:19    Microbiology: Recent Results (from the past 240 hour(s))  Blood Culture (routine x 2)     Status: None (Preliminary result)   Collection Time: 05/01/15  8:30 PM  Result Value Ref Range Status   Specimen Description BLOOD LEFT FOREARM  Final   Special Requests BOTTLES DRAWN AEROBIC AND ANAEROBIC 6CC  Final   Culture NO GROWTH 2 DAYS  Final   Report Status PENDING  Incomplete  Blood Culture (routine x 2)     Status: None (Preliminary result)   Collection Time: 05/01/15  8:55 PM  Result Value Ref Range Status   Specimen Description BLOOD RIGHT HAND  Final   Special Requests BOTTLES DRAWN AEROBIC AND ANAEROBIC 6CC  Final   Culture NO GROWTH 2 DAYS  Final   Report Status PENDING  Incomplete  Urine culture     Status: None (Preliminary result)   Collection Time: 05/01/15  9:27 PM  Result Value Ref Range Status   Specimen Description URINE, CATHETERIZED  Final   Special Requests NONE  Final   Culture   Final    Culture reincubated for better growth Performed at [facility]    Report Status PENDING  Incomplete     Labs: Basic Metabolic Panel:  Recent Labs Lab 05/01/15 2030 05/02/15 0547 05/03/15 0552 05/04/15 0556  NA 135 142 142 144  K 3.4* 4.1 4.5 4.0  CL 105 111 115* 115*  CO2 22 23 21* 25  GLUCOSE 143* 155* 184* 105*  BUN 12 12 15  23*  CREATININE 1.08* 1.02* 0.91 0.97  CALCIUM 9.6 9.2 9.3 9.3   Liver Function Tests:  Recent Labs Lab 05/01/15 2030 05/02/15 0547 05/03/15 0552 05/04/15 0556  AST 24 24 19 19   ALT 14 16 15 17   ALKPHOS 105 84 82 91  BILITOT 0.5 0.6 0.3 0.5  PROT 7.5 6.9 6.5 5.8*  ALBUMIN 3.9 3.5 3.0* 2.9*   No results for input(s): LIPASE, AMYLASE in the last 168 hours. No results for input(s): AMMONIA in the last 168 hours. CBC:  Recent Labs Lab 05/01/15 2030 05/02/15 0547 05/03/15 0552 05/04/15 0556  WBC 14.6* 14.8* 16.3* 13.9*  NEUTROABS 11.9* 12.7* 14.1* 11.1*  HGB 11.7* 10.8* 10.2* 10.1*  HCT 36.2 34.3* 32.5* 31.4*  MCV 81.2 82.7 82.7 82.6  PLT 333 328 333 349   Cardiac Enzymes: No results for input(s): CKTOTAL, CKMB, CKMBINDEX, TROPONINI in the last 168 hours. BNP: BNP (last 3 results) No results for input(s): BNP in the last 8760 hours.  ProBNP (last 3 results) No results for input(s): PROBNP in the last 8760 hours.  CBG: No results for input(s): GLUCAP in the last 168 hours.     SignedKelvin Cellar  [facility] 05/04/2015, 7:45 AM

## 2015-05-04 NOTE — Progress Notes (Signed)
Patient with orders to be discharge home. Discharge instructions given, patient verbalized understanding. Prescriptions given. Patient stable. Patient left in private vehicle with family.  

## 2015-05-05 LAB — URINE CULTURE

## 2015-05-06 LAB — CULTURE, BLOOD (ROUTINE X 2)
CULTURE: NO GROWTH
CULTURE: NO GROWTH

## 2015-09-12 ENCOUNTER — Encounter (HOSPITAL_COMMUNITY): Payer: Self-pay | Admitting: *Deleted

## 2015-09-12 ENCOUNTER — Emergency Department (HOSPITAL_COMMUNITY)
Admission: EM | Admit: 2015-09-12 | Discharge: 2015-09-12 | Disposition: A | Discharge status: Home / Self Care | Payer: No Typology Code available for payment source | Attending: Emergency Medicine | Admitting: Emergency Medicine

## 2015-09-12 ENCOUNTER — Emergency Department (HOSPITAL_COMMUNITY): Payer: Self-pay

## 2015-09-12 DIAGNOSIS — Z9049 Acquired absence of other specified parts of digestive tract: Secondary | ICD-10-CM | POA: Insufficient documentation

## 2015-09-12 DIAGNOSIS — R197 Diarrhea, unspecified: Secondary | ICD-10-CM | POA: Insufficient documentation

## 2015-09-12 DIAGNOSIS — Z91040 Latex allergy status: Secondary | ICD-10-CM | POA: Insufficient documentation

## 2015-09-12 DIAGNOSIS — R1084 Generalized abdominal pain: Secondary | ICD-10-CM | POA: Insufficient documentation

## 2015-09-12 DIAGNOSIS — R52 Pain, unspecified: Secondary | ICD-10-CM

## 2015-09-12 DIAGNOSIS — J45901 Unspecified asthma with (acute) exacerbation: Secondary | ICD-10-CM | POA: Insufficient documentation

## 2015-09-12 DIAGNOSIS — Z792 Long term (current) use of antibiotics: Secondary | ICD-10-CM | POA: Insufficient documentation

## 2015-09-12 DIAGNOSIS — R42 Dizziness and giddiness: Secondary | ICD-10-CM | POA: Insufficient documentation

## 2015-09-12 DIAGNOSIS — Z8719 Personal history of other diseases of the digestive system: Secondary | ICD-10-CM | POA: Insufficient documentation

## 2015-09-12 DIAGNOSIS — Z79899 Other long term (current) drug therapy: Secondary | ICD-10-CM | POA: Insufficient documentation

## 2015-09-12 DIAGNOSIS — R1110 Vomiting, unspecified: Secondary | ICD-10-CM | POA: Insufficient documentation

## 2015-09-12 DIAGNOSIS — I10 Essential (primary) hypertension: Secondary | ICD-10-CM | POA: Insufficient documentation

## 2015-09-12 DIAGNOSIS — R140 Abdominal distension (gaseous): Secondary | ICD-10-CM | POA: Insufficient documentation

## 2015-09-12 LAB — COMPREHENSIVE METABOLIC PANEL
ALBUMIN: 4.3 g/dL (ref 3.5–5.0)
ALT: 19 U/L (ref 14–54)
ANION GAP: 8 (ref 5–15)
AST: 26 U/L (ref 15–41)
Alkaline Phosphatase: 136 U/L — ABNORMAL HIGH (ref 38–126)
BUN: 18 mg/dL (ref 6–20)
CHLORIDE: 104 mmol/L (ref 101–111)
CO2: 29 mmol/L (ref 22–32)
Calcium: 9.9 mg/dL (ref 8.9–10.3)
Creatinine, Ser: 1.15 mg/dL — ABNORMAL HIGH (ref 0.44–1.00)
GFR calc Af Amer: 58 mL/min — ABNORMAL LOW (ref >60)
GFR calc non Af Amer: 50 mL/min — ABNORMAL LOW (ref >60)
GLUCOSE: 79 mg/dL (ref 65–99)
POTASSIUM: 3.2 mmol/L — AB (ref 3.5–5.1)
SODIUM: 141 mmol/L (ref 135–145)
Total Bilirubin: 0.4 mg/dL (ref 0.3–1.2)
Total Protein: 7.5 g/dL (ref 6.5–8.1)

## 2015-09-12 LAB — CBC WITH DIFFERENTIAL/PLATELET
BASOS PCT: 1 %
Basophils Absolute: 0.1 10*3/uL (ref 0.0–0.1)
EOS ABS: 0.6 10*3/uL (ref 0.0–0.7)
Eosinophils Relative: 6 %
HCT: 36.6 % (ref 36.0–46.0)
Hemoglobin: 11.7 g/dL — ABNORMAL LOW (ref 12.0–15.0)
Lymphocytes Relative: 39 %
Lymphs Abs: 3.5 10*3/uL (ref 0.7–4.0)
MCH: 26.1 pg (ref 26.0–34.0)
MCHC: 32 g/dL (ref 30.0–36.0)
MCV: 81.7 fL (ref 78.0–100.0)
MONOS PCT: 10 %
Monocytes Absolute: 0.9 10*3/uL (ref 0.1–1.0)
NEUTROS PCT: 44 %
Neutro Abs: 3.9 10*3/uL (ref 1.7–7.7)
PLATELETS: 335 10*3/uL (ref 150–400)
RBC: 4.48 MIL/uL (ref 3.87–5.11)
RDW: 15.6 % — AB (ref 11.5–15.5)
WBC: 9 10*3/uL (ref 4.0–10.5)

## 2015-09-12 LAB — LIPASE, BLOOD: Lipase: 53 U/L — ABNORMAL HIGH (ref 22–51)

## 2015-09-12 MED ORDER — FENTANYL CITRATE (PF) 100 MCG/2ML IJ SOLN
100.0000 ug | Freq: Once | INTRAMUSCULAR | Status: AC
  Administered 2015-09-12: 100 ug via INTRAVENOUS
  Filled 2015-09-12: qty 2

## 2015-09-12 MED ORDER — ONDANSETRON HCL 4 MG PO TABS: 4.0000 mg | ORAL_TABLET | Freq: Three times a day (TID) | ORAL | Status: DC | PRN

## 2015-09-12 MED ORDER — SODIUM CHLORIDE 0.9 % IV BOLUS (SEPSIS)
500.0000 mL | Freq: Once | INTRAVENOUS | Status: AC
  Administered 2015-09-12: 500 mL via INTRAVENOUS

## 2015-09-12 MED ORDER — FAMOTIDINE 20 MG PO TABS: 20.0000 mg | ORAL_TABLET | Freq: Every day | ORAL | Status: DC

## 2015-09-12 MED ORDER — GI COCKTAIL ~~LOC~~
30.0000 mL | Freq: Once | ORAL | Status: AC
  Administered 2015-09-12: 30 mL via ORAL
  Filled 2015-09-12: qty 30

## 2015-09-12 MED ORDER — ONDANSETRON HCL 4 MG/2ML IJ SOLN
4.0000 mg | Freq: Once | INTRAMUSCULAR | Status: AC
  Administered 2015-09-12: 4 mg via INTRAVENOUS
  Filled 2015-09-12: qty 2

## 2015-09-12 MED ORDER — SODIUM CHLORIDE 0.9 % IV SOLN: Freq: Once | INTRAVENOUS | Status: DC

## 2015-09-12 MED ORDER — FAMOTIDINE 20 MG PO TABS
20.0000 mg | ORAL_TABLET | Freq: Once | ORAL | Status: AC
Start: 2015-09-12 — End: 2015-09-12
  Administered 2015-09-12: 20 mg via ORAL
  Filled 2015-09-12: qty 1

## 2015-09-12 NOTE — ED Notes (Signed)
Pt c/o increase in nausea ,  No change in pain after GI cocktail.

## 2015-09-12 NOTE — ED Provider Notes (Signed)
CSN: [id number]     Arrival date & time 09/12/15  1350 History   First MD Initiated Contact with Patient 09/12/15 1404     Chief Complaint  Patient presents with  . Abdominal Pain    HPI  [name] is a 59 y.o. female who presents to the ED with abdominal pain. Patient has PMH of diverticulitis, asthma, and hypertension. Patient states abdominal pain started Tuesday with associated emesis and diarrhea. She states that this feels like the last time she had diverticulitis. She is having associated bloating. Patient states that she has had more than 5 episodes of emesis today but denies hematemesis. She has also had 4 loose stools today. Denies any blood in stool. Diarrhea and emesis occuring for the last 2 days.  Abdominal pain is generalized to entire abdomen but mainly LLQ. Patient states that she had some leftover Cipro and Flagyl in refrigerator from last time she had diverticulitis. She took 1 pill of each today to see if she would get any relief. She did not have any pain relief.   Past Medical History  Diagnosis Date  . Hypertension   . Asthma   . Diverticulitis    Past Surgical History  Procedure Laterality Date  . Cholecystectomy    . Ablation colpoclesis     Family History  Problem Relation Age of Onset  . Emphysema Mother   . Stroke Father   . Hypertension Father   . Diabetes Sister   . Hypertension Sister   . Stroke Brother   . Hypertension Brother    Social History  Substance Use Topics  . Smoking status: Never Smoker   . Smokeless tobacco: Never Used  . Alcohol Use: No   OB History    Gravida Para Term Preterm AB TAB SAB Ectopic Multiple Living   3 2 2  1  1         Review of Systems  Constitutional: Negative for fever and chills.  Respiratory: Positive for shortness of breath.   Cardiovascular: Negative for chest pain.  Gastrointestinal: Positive for abdominal pain, diarrhea and abdominal distention. Negative for blood in stool.  Neurological:  Positive for dizziness.  Also per HPI  Allergies  Iohexol; Latex; and Sulfa antibiotics  Home Medications   Prior to Admission medications   Medication Sig Start Date End Date Taking? Authorizing Provider  albuterol (PROVENTIL HFA;VENTOLIN HFA) 108 (90 BASE) MCG/ACT inhaler Inhale 2 puffs into the lungs every 6 (six) hours as needed. Asthma   Yes Historical Provider, MD  ciprofloxacin (CIPRO) 500 MG tablet Take 500 mg by mouth 2 (two) times daily. Filled 09/11/2015 for 10 day supply   Yes Historical Provider, MD  triamterene-hydrochlorothiazide (MAXZIDE) 75-50 MG per tablet Take 0.5 tablets by mouth daily.   Yes Historical Provider, MD  famotidine (PEPCID) 20 MG tablet Take 1 tablet (20 mg total) by mouth daily. 09/12/15   Katheren Shams, DO  levofloxacin (LEVAQUIN) 750 MG tablet Take 1 tablet (750 mg total) by mouth daily. Patient not taking: Reported on 09/12/2015 05/04/15   Kelvin Cellar, MD  ondansetron (ZOFRAN) 4 MG tablet Take 1 tablet (4 mg total) by mouth every 8 (eight) hours as needed for nausea or vomiting. 09/12/15   Katheren Shams, DO   BP 151/80 mmHg  Pulse 79  Temp(Src) 97.5 F (36.4 C) (Oral)  Resp 16  Ht 4\' 11"  (1.499 m)  Wt 192 lb (87.091 kg)  BMI 38.76 kg/m2  SpO2 96% Physical Exam  Constitutional: She is oriented to person, place, and time. She appears well-developed and well-nourished. She appears distressed.  HENT:  Head: Normocephalic and atraumatic.  Mouth/Throat: Oropharynx is clear and moist.  Eyes: EOM are normal.  Neck: Normal range of motion.  Cardiovascular: Normal rate, regular rhythm and normal pulses.   Pulmonary/Chest: Effort normal and breath sounds normal.  Abdominal: Soft. Bowel sounds are normal. She exhibits no distension. There is generalized tenderness. There is no rigidity and no guarding.  Neurological: She is alert and oriented to person, place, and time.  Skin: Skin is warm and dry.  Psychiatric: She has a normal mood and affect.     ED Course  Procedures (including critical care time) Labs Review Labs Reviewed  CBC WITH DIFFERENTIAL/PLATELET - Abnormal; Notable for the following:    Hemoglobin 11.7 (*)    RDW 15.6 (*)    All other components within normal limits  COMPREHENSIVE METABOLIC PANEL - Abnormal; Notable for the following:    Potassium 3.2 (*)    Creatinine, Ser 1.15 (*)    Alkaline Phosphatase 136 (*)    GFR calc non Af Amer 50 (*)    GFR calc Af Amer 58 (*)    All other components within normal limits  LIPASE, BLOOD - Abnormal; Notable for the following:    Lipase 53 (*)    All other components within normal limits    Imaging Review Ct Abdomen Pelvis Wo Contrast  09/12/2015   CLINICAL DATA:  Pt comes in with abdominal pain starting on Tuesday with episodes of emesis. Pt has some episodes of diarrhea. Pt states she feels bloated her her abdomen.  EXAM: CT ABDOMEN AND PELVIS WITHOUT CONTRAST  TECHNIQUE: Multidetector CT imaging of the abdomen and pelvis was performed following the standard protocol without IV contrast.  COMPARISON:  02/16/2013  FINDINGS: Lung bases: Mild subsegmental atelectasis. Otherwise clear. Heart normal in size.  Liver: Borderline enlarged. Diffuse fatty infiltration. No mass or focal lesion.  Spleen, pancreas, adrenal glands:  Unremarkable.  Gallbladder and biliary tree: Gallbladder surgically absent. No bile duct dilation.  Kidneys, ureters, bladder:  Normal.  Uterus and adnexa:  Unremarkable.  Lymph nodes: There is a single enlarged left periaortic lymph node, along the infrarenal abdominal aorta, measuring 16 mm in short axis. This is nonspecific. No other adenopathy.  Ascites:  None.  Gastrointestinal: Multiple colonic diverticula along the left colon. No diverticulitis. Colon otherwise unremarkable. Normal stomach and small bowel. Normal appendix seen.  Vascular: Atherosclerotic calcifications along the aorta and its branch vessels. No aneurysm.  Musculoskeletal: Mild disc  degenerative changes of the visible thoracic and lumbar spine. Facet degenerative changes in the lower lumbar spine. No osteoblastic or osteolytic lesions.  IMPRESSION: 1. No acute findings. 2. Single enlarged left periaortic lymph node, currently measuring 16 mm in short axis, previously 12 mm. This is nonspecific. No other adenopathy. No other change from the prior study. 3. Borderline hepatomegaly and diffuse hepatic steatosis. 4. Status post cholecystectomy. 5. Atherosclerotic calcifications along the abdominal aorta and its branch vessels. 6. Left colon diverticulosis.  No diverticulitis.   Electronically Signed   By: Lajean Manes M.D.   On: 09/12/2015 15:55   I have personally reviewed and evaluated these images and lab results as part of my medical decision-making.   EKG Interpretation None      MDM   Final diagnoses:  Generalized abdominal pain   Pateint presented to the ED with generalized abdominal pain with associated emesis and diarrhea  of 2 days.  CT abdomen obtained that was acutely unremarkable. Patient has diverticulosis but no signs of diverticulitis. Mild AKI seen on labs. Most likely secondary to dehydration and poor perfusion from emesis and diarrhea. NS bolus given in ED.   Patient given pain medication and antiemetic with relief of symptoms. She was able to keep food and water down without emesis. Abdominal pain etiology unknown but seems related to epigastric pain from reflux. Patient was no longer taking antiacid.  Discharge home in stable condition. Patient given Rx for pepcid and zofran. She is to follow-up with PCP. Return precautions discussed. Patient agreeable to plan.    Luiz Blare, DO PGY-2, [place], DO 09/12/15 2106  Leonard Schwartz, MD 09/13/15 [phone]

## 2015-09-12 NOTE — ED Notes (Signed)
Pt comes in with abdominal pain starting on Tuesday with episodes of emesis. Pt has some episodes of diarrhea. Pt states she feels bloated her her abdomen. NAD noted.

## 2015-09-12 NOTE — Discharge Instructions (Signed)
·   We did not find any major cause for your abdominal pain. No diverticulitis seen.   I believe you may be having discomfort from reflux. Recommend you go back to taking Pepcid daily.   Prescription given for zofran for nausea/vomiting as needed  Please stay hydrated; drink plenty of water to replace what you lost  Please follow-up with your PCP for further evaluation   Abdominal Pain Many things can cause abdominal pain. Usually, abdominal pain is not caused by a disease and will improve without treatment. It can often be observed and treated at home. Your health care provider will do a physical exam and possibly order blood tests and X-rays to help determine the seriousness of your pain. However, in many cases, more time must pass before a clear cause of the pain can be found. Before that point, your health care provider may not know if you need more testing or further treatment. HOME CARE INSTRUCTIONS  Monitor your abdominal pain for any changes. The following actions may help to alleviate any discomfort you are experiencing:  Only take over-the-counter or prescription medicines as directed by your health care provider.  Do not take laxatives unless directed to do so by your health care provider.  Try a clear liquid diet (broth, tea, or water) as directed by your health care provider. Slowly move to a bland diet as tolerated. SEEK MEDICAL CARE IF:  You have unexplained abdominal pain.  You have abdominal pain associated with nausea or diarrhea.  You have pain when you urinate or have a bowel movement.  You experience abdominal pain that wakes you in the night.  You have abdominal pain that is worsened or improved by eating food.  You have abdominal pain that is worsened with eating fatty foods.  You have a fever. SEEK IMMEDIATE MEDICAL CARE IF:   Your pain does not go away within 2 hours.  You keep throwing up (vomiting).  Your pain is felt only in portions of the abdomen,  such as the right side or the left lower portion of the abdomen.  You pass bloody or black tarry stools. MAKE SURE YOU:  Understand these instructions.   Will watch your condition.   Will get help right away if you are not doing well or get worse.  Document Released: 09/16/2005 Document Revised: 12/12/2013 Document Reviewed: 08/16/2013 [facility] Patient Information 2015 [place], [place]. This information is not intended to replace advice given to you by your health care provider. Make sure you discuss any questions you have with your health care provider.

## 2015-09-12 NOTE — ED Notes (Signed)
nad noted prior to dc. Dc instructions reviewed. 2 scripts given to pt. Ambulated out without difficulty.

## 2016-02-06 ENCOUNTER — Emergency Department (HOSPITAL_COMMUNITY)
Admission: EM | Admit: 2016-02-06 | Discharge: 2016-02-07 | Disposition: A | Discharge status: Home / Self Care | Payer: No Typology Code available for payment source | Attending: Emergency Medicine | Admitting: Emergency Medicine

## 2016-02-06 ENCOUNTER — Encounter (HOSPITAL_COMMUNITY): Payer: Self-pay | Admitting: Emergency Medicine

## 2016-02-06 DIAGNOSIS — G4700 Insomnia, unspecified: Secondary | ICD-10-CM

## 2016-02-06 DIAGNOSIS — R51 Headache: Secondary | ICD-10-CM | POA: Insufficient documentation

## 2016-02-06 DIAGNOSIS — Z8719 Personal history of other diseases of the digestive system: Secondary | ICD-10-CM | POA: Insufficient documentation

## 2016-02-06 DIAGNOSIS — H538 Other visual disturbances: Secondary | ICD-10-CM | POA: Insufficient documentation

## 2016-02-06 DIAGNOSIS — R519 Headache, unspecified: Secondary | ICD-10-CM

## 2016-02-06 DIAGNOSIS — R202 Paresthesia of skin: Secondary | ICD-10-CM | POA: Insufficient documentation

## 2016-02-06 DIAGNOSIS — H53149 Visual discomfort, unspecified: Secondary | ICD-10-CM | POA: Insufficient documentation

## 2016-02-06 DIAGNOSIS — I10 Essential (primary) hypertension: Secondary | ICD-10-CM | POA: Insufficient documentation

## 2016-02-06 DIAGNOSIS — Z79899 Other long term (current) drug therapy: Secondary | ICD-10-CM | POA: Insufficient documentation

## 2016-02-06 DIAGNOSIS — Z91040 Latex allergy status: Secondary | ICD-10-CM | POA: Insufficient documentation

## 2016-02-06 DIAGNOSIS — J45909 Unspecified asthma, uncomplicated: Secondary | ICD-10-CM | POA: Insufficient documentation

## 2016-02-06 MED ORDER — KETOROLAC TROMETHAMINE 30 MG/ML IJ SOLN
15.0000 mg | Freq: Once | INTRAMUSCULAR | Status: AC
  Administered 2016-02-06: 15 mg via INTRAVENOUS
  Filled 2016-02-06: qty 1

## 2016-02-06 MED ORDER — PROCHLORPERAZINE EDISYLATE 5 MG/ML IJ SOLN
10.0000 mg | Freq: Once | INTRAMUSCULAR | Status: AC
  Administered 2016-02-06: 10 mg via INTRAVENOUS
  Filled 2016-02-06: qty 2

## 2016-02-06 MED ORDER — ZOLPIDEM TARTRATE 5 MG PO TABS: 5.0000 mg | ORAL_TABLET | Freq: Every evening | ORAL | Status: DC | PRN

## 2016-02-06 MED ORDER — DIPHENHYDRAMINE HCL 50 MG/ML IJ SOLN
25.0000 mg | Freq: Once | INTRAMUSCULAR | Status: AC
  Administered 2016-02-06: 25 mg via INTRAVENOUS
  Filled 2016-02-06: qty 1

## 2016-02-06 MED ORDER — SODIUM CHLORIDE 0.9 % IV BOLUS (SEPSIS)
500.0000 mL | Freq: Once | INTRAVENOUS | Status: AC
  Administered 2016-02-06: 500 mL via INTRAVENOUS

## 2016-02-06 MED ORDER — DIPHENHYDRAMINE HCL 12.5 MG/5ML PO ELIX
25.0000 mg | ORAL_SOLUTION | Freq: Once | ORAL | Status: DC
  Filled 2016-02-06: qty 10

## 2016-02-06 NOTE — Discharge Instructions (Signed)

## 2016-02-06 NOTE — ED Notes (Signed)
C/o headache since Monday.  When to PCP Tuesday for headache and treated with sleeping pills because she said she could not sleep.  C/o dizziness about 3 weeks and blurred vision Thursday of last week.  C/o tingling of soles of both feet.  Pt has tremors to both hands during triage.  Denies drinking.

## 2016-02-06 NOTE — ED Notes (Signed)
Pt c/o constant headache for two weeks with difficulty sleeping and blurred vision. Pt does wear glasses, states she needs a new pair this month. Pt denies N/V/.

## 2016-02-06 NOTE — ED Notes (Signed)
Pt states she was prescribed Trazadone by her PCP Tuesday. Pt reports taking 6 pills on Tuesday and 4 pills on Wednesday without any result.

## 2016-02-06 NOTE — ED Provider Notes (Signed)
CSN: [id number]     Arrival date & time 02/06/16  1541 History  By signing my name below, I, [name], attest that this documentation has been prepared under the direction and in the presence of Virgel Manifold, MD. Electronically Signed: Meriel [name], ED Scribe. 02/06/2016. 10:34 PM.   Chief Complaint  Patient presents with  . Headache   The history is provided by the patient. No language interpreter was used.   HPI Comments: [name] is a 60 y.o. female, with a h/o hypertension, who presents to the Emergency Department complaining of a constant headache X 2-3 weeks. She associates pain behind bilateral eyes, intermittent photophobia, tingling to soles of bilateral feet, insomnia, and blurry vision which she attributes to sleep deprivation. Pt notes no modifying factors to her headache. She was prescribed trazodone 3 days ago by her PCP without relief of insomnia and notes she has tried benzodiazepine medications in the past for insomnia, also without relief. Additionally she has been taking tylenol without significant relief of her headache. She notes a long standing history of headaches but describes them to be less severe than her current headache. Pt denies nausea, vomiting, recent fevers, or known h/o DM.  Past Medical History  Diagnosis Date  . Hypertension   . Asthma   . Diverticulitis    Past Surgical History  Procedure Laterality Date  . Cholecystectomy    . Ablation colpoclesis     Family History  Problem Relation Age of Onset  . Emphysema Mother   . Stroke Father   . Hypertension Father   . Diabetes Sister   . Hypertension Sister   . Stroke Brother   . Hypertension Brother    Social History  Substance Use Topics  . Smoking status: Never Smoker   . Smokeless tobacco: Never Used  . Alcohol Use: No   OB History    Gravida Para Term Preterm AB TAB SAB Ectopic Multiple Living   3 2 2  1  1         Review of Systems  Constitutional: Negative for fever.   Eyes: Positive for photophobia and visual disturbance.  Gastrointestinal: Negative for nausea and vomiting.  Neurological: Positive for headaches.  Psychiatric/Behavioral: Positive for sleep disturbance.  A complete 10 system review of systems was obtained and is otherwise negative except at noted in the HPI and PMH.  Allergies  Iohexol; Latex; Benadryl; and Sulfa antibiotics  Home Medications   Prior to Admission medications   Medication Sig Start Date End Date Taking? Authorizing Provider  traZODone (DESYREL) 50 MG tablet Take 50-150 mg by mouth at bedtime.   Yes Historical Provider, MD  triamterene-hydrochlorothiazide (MAXZIDE) 75-50 MG per tablet Take 0.5 tablets by mouth daily.   Yes Historical Provider, MD  famotidine (PEPCID) 20 MG tablet Take 1 tablet (20 mg total) by mouth daily. Patient not taking: Reported on 02/06/2016 09/12/15   Katheren Shams, DO  levofloxacin (LEVAQUIN) 750 MG tablet Take 1 tablet (750 mg total) by mouth daily. Patient not taking: Reported on 09/12/2015 05/04/15   Kelvin Cellar, MD  ondansetron (ZOFRAN) 4 MG tablet Take 1 tablet (4 mg total) by mouth every 8 (eight) hours as needed for nausea or vomiting. Patient not taking: Reported on 02/06/2016 09/12/15   Katheren Shams, DO   BP 119/79 mmHg  Pulse 87  Temp(Src) 97.9 F (36.6 C) (Oral)  Resp 18  Ht 4\' 11"  (1.499 m)  Wt 192 lb (87.091 kg)  BMI 38.76 kg/m2  SpO2 99% Physical Exam  Constitutional: She is oriented to person, place, and time. She appears well-developed and well-nourished.  HENT:  Head: Normocephalic and atraumatic.  Eyes: Conjunctivae and EOM are normal. Pupils are equal, round, and reactive to light.  Neck: Normal range of motion. Neck supple.  Cardiovascular: Normal rate, regular rhythm and normal heart sounds.   Pulmonary/Chest: Effort normal and breath sounds normal. No respiratory distress. She has no wheezes.  Abdominal: Soft. There is no tenderness.  Musculoskeletal: Normal  range of motion. She exhibits no edema.  Neurological: She is alert and oriented to person, place, and time. No cranial nerve deficit.  Strength normal. Normal finger to nose testing. Cranial nerves intact. No nuchal rigidity   Skin: Skin is warm and dry.  Psychiatric: She has a normal mood and affect.  Nursing note and vitals reviewed.   ED Course  Procedures  DIAGNOSTIC STUDIES: Oxygen Saturation is 99% on RA, normal by my interpretation.    COORDINATION OF CARE: 9:57 PM Discussed treatment plan which includes IV fluids, Toradol and compazine. Pt agreeable to plan.    MDM   Final diagnoses:  Nonintractable headache, unspecified chronicity pattern, unspecified headache type  Insomnia    59yf with headache. Suspect primary HA. Consider emergent secondary causes such as bleed, infectious or mass but doubt. There is no history of trauma. Pt has a nonfocal neurological exam. Afebrile and neck supple. No use of blood thinning medication. Consider ocular etiology such as acute angle closure glaucoma but doubt. Pt denies acute change in visual acuity and eye exam unremarkable. Doubt temporal arteritis. No temporal tenderness and temporal artery pulsations palpable. Doubt CO poisoning. No contacts with similar symptoms. Doubt venous thrombosis. Doubt carotid or vertebral arteries dissection. Symptoms improved with meds. Feel that can be safely discharged, but strict return precautions discussed. Requesting medication to help her sleep. Gave prescription for a few pills of ambien. Cautioned about usage and not to take concurrently with trazadone her PCP prescribed her. Outpt fu.    Virgel Manifold, MD 02/11/16 [phone]

## 2016-02-17 ENCOUNTER — Other Ambulatory Visit (HOSPITAL_COMMUNITY): Payer: Self-pay | Admitting: Family Medicine

## 2016-03-03 ENCOUNTER — Other Ambulatory Visit (HOSPITAL_COMMUNITY): Payer: Self-pay | Admitting: *Deleted

## 2016-03-03 DIAGNOSIS — Z1231 Encounter for screening mammogram for malignant neoplasm of breast: Secondary | ICD-10-CM

## 2016-03-12 ENCOUNTER — Ambulatory Visit (HOSPITAL_COMMUNITY)
Admission: RE | Admit: 2016-03-12 | Discharge: 2016-03-12 | Disposition: A | Discharge status: Home / Self Care | Payer: No Typology Code available for payment source | Source: Ambulatory Visit | Attending: *Deleted | Admitting: *Deleted

## 2016-03-12 DIAGNOSIS — Z1231 Encounter for screening mammogram for malignant neoplasm of breast: Secondary | ICD-10-CM | POA: Insufficient documentation

## 2016-06-26 ENCOUNTER — Encounter (INDEPENDENT_AMBULATORY_CARE_PROVIDER_SITE_OTHER): Payer: Self-pay | Admitting: *Deleted

## 2016-07-01 IMAGING — CR DG CHEST 1V PORT
1 series · 1 of 1 positions shown · non-contrast
Comparison: February 16, 2013.

CLINICAL DATA: Sepsis, fever.

EXAM:
PORTABLE CHEST - 1 VIEW

[ap portable]
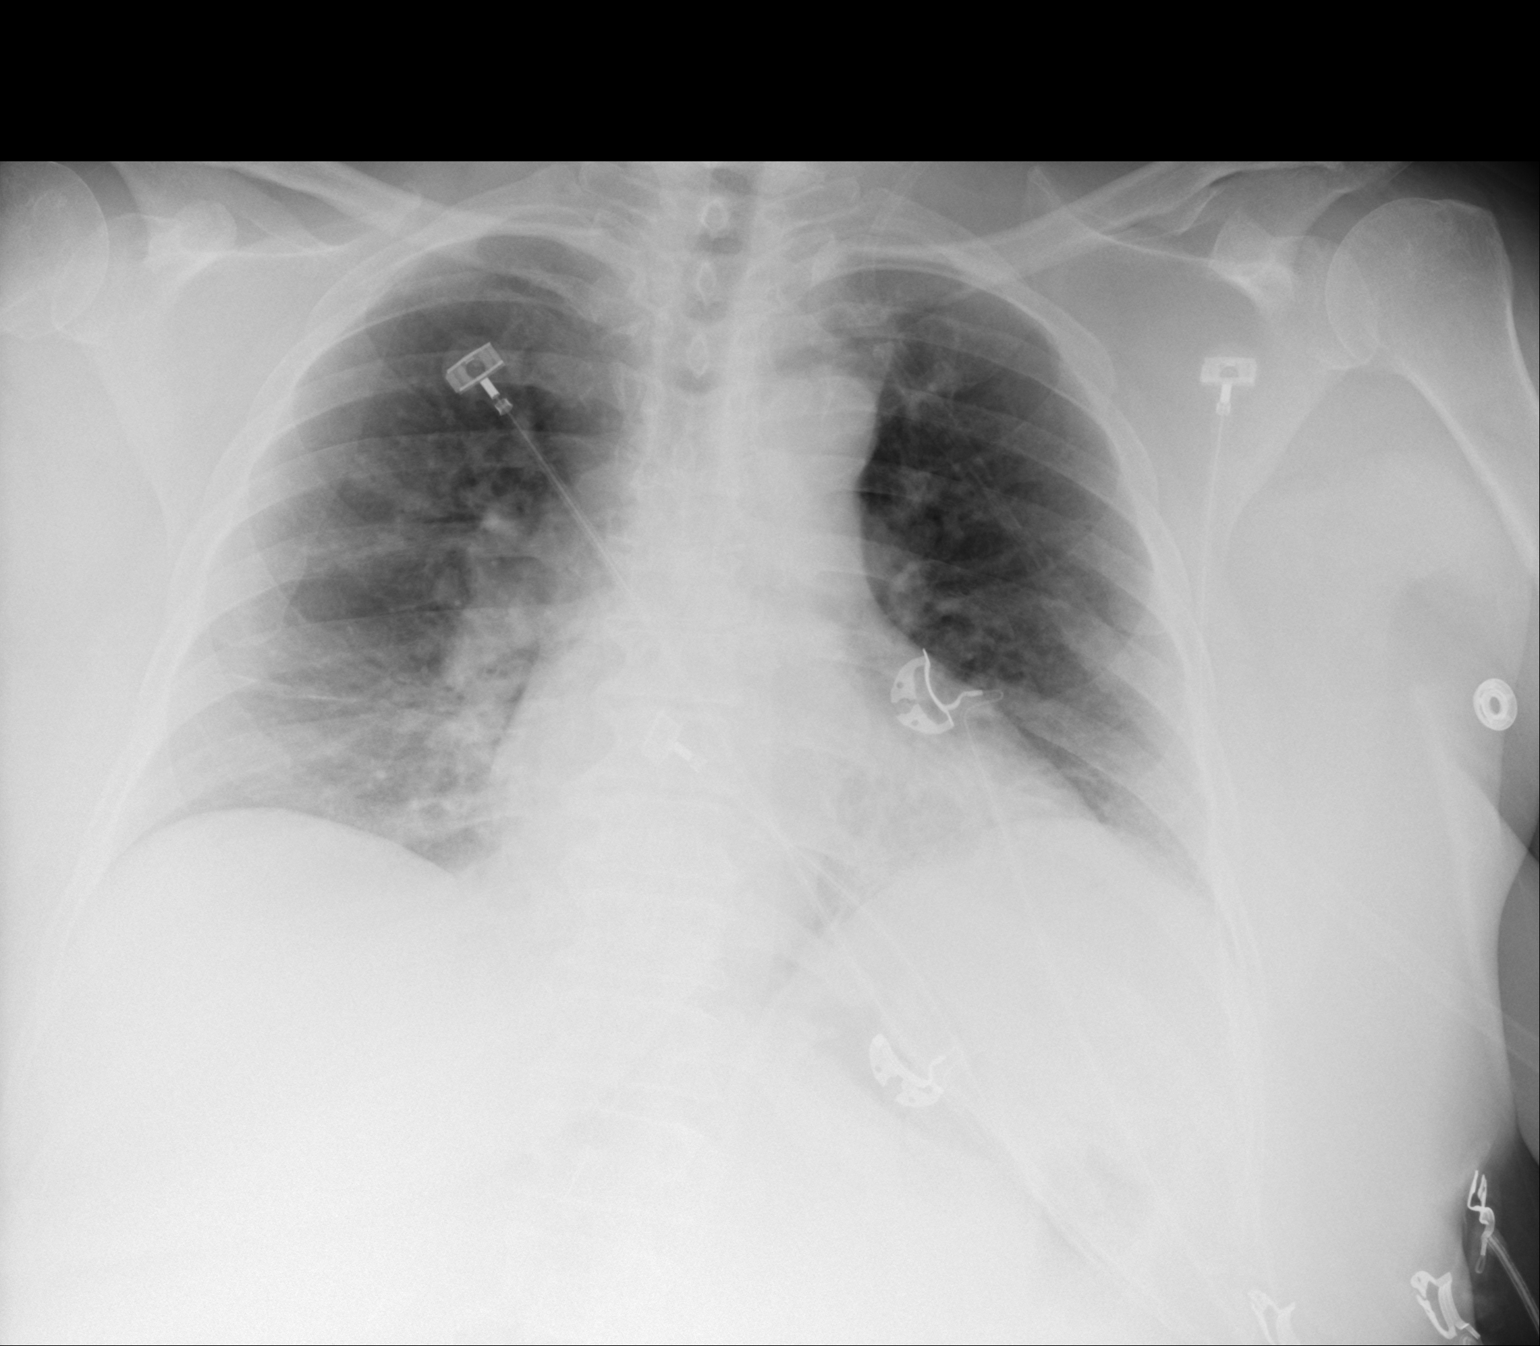

[1 of 1 positions shown; findings below may reference images not displayed]

FINDINGS: The heart size and mediastinal contours are within normal limits.
Hypoinflation of the lungs is noted. No pneumothorax or pleural
effusion is noted. Left lung is clear. Stable right basilar density
is noted consistent with scarring. No acute pulmonary disease is
noted. The visualized skeletal structures are unremarkable.
IMPRESSION: No acute cardiopulmonary abnormality seen.

## 2016-09-15 ENCOUNTER — Emergency Department (HOSPITAL_COMMUNITY)
Admission: EM | Admit: 2016-09-15 | Discharge: 2016-09-15 | Disposition: A | Discharge status: Home / Self Care | Payer: No Typology Code available for payment source | Attending: Emergency Medicine | Admitting: Emergency Medicine

## 2016-09-15 ENCOUNTER — Encounter (HOSPITAL_COMMUNITY): Payer: Self-pay | Admitting: Emergency Medicine

## 2016-09-15 ENCOUNTER — Emergency Department (HOSPITAL_COMMUNITY): Payer: No Typology Code available for payment source

## 2016-09-15 DIAGNOSIS — R42 Dizziness and giddiness: Secondary | ICD-10-CM | POA: Insufficient documentation

## 2016-09-15 DIAGNOSIS — Z79899 Other long term (current) drug therapy: Secondary | ICD-10-CM | POA: Insufficient documentation

## 2016-09-15 DIAGNOSIS — R110 Nausea: Secondary | ICD-10-CM | POA: Insufficient documentation

## 2016-09-15 DIAGNOSIS — I10 Essential (primary) hypertension: Secondary | ICD-10-CM | POA: Insufficient documentation

## 2016-09-15 DIAGNOSIS — J45909 Unspecified asthma, uncomplicated: Secondary | ICD-10-CM | POA: Insufficient documentation

## 2016-09-15 LAB — CBG MONITORING, ED: GLUCOSE-CAPILLARY: 111 mg/dL — AB (ref 65–99)

## 2016-09-15 LAB — BASIC METABOLIC PANEL
Anion gap: 5 (ref 5–15)
BUN: 13 mg/dL (ref 6–20)
CALCIUM: 10.4 mg/dL — AB (ref 8.9–10.3)
CO2: 26 mmol/L (ref 22–32)
CREATININE: 1.07 mg/dL — AB (ref 0.44–1.00)
Chloride: 108 mmol/L (ref 101–111)
GFR, EST NON AFRICAN AMERICAN: 54 mL/min — AB (ref >60)
GLUCOSE: 108 mg/dL — AB (ref 65–99)
Potassium: 3.4 mmol/L — ABNORMAL LOW (ref 3.5–5.1)
Sodium: 139 mmol/L (ref 135–145)

## 2016-09-15 LAB — CBC
HCT: 38.4 % (ref 36.0–46.0)
Hemoglobin: 12.4 g/dL (ref 12.0–15.0)
MCH: 26.7 pg (ref 26.0–34.0)
MCHC: 32.3 g/dL (ref 30.0–36.0)
MCV: 82.8 fL (ref 78.0–100.0)
PLATELETS: 336 10*3/uL (ref 150–400)
RBC: 4.64 MIL/uL (ref 3.87–5.11)
RDW: 15.3 % (ref 11.5–15.5)
WBC: 7.1 10*3/uL (ref 4.0–10.5)

## 2016-09-15 LAB — URINALYSIS, ROUTINE W REFLEX MICROSCOPIC
BILIRUBIN URINE: NEGATIVE
Glucose, UA: NEGATIVE mg/dL
HGB URINE DIPSTICK: NEGATIVE
KETONES UR: NEGATIVE mg/dL
Leukocytes, UA: NEGATIVE
NITRITE: NEGATIVE
PROTEIN: NEGATIVE mg/dL
Specific Gravity, Urine: <1.005 — ABNORMAL LOW (ref 1.005–1.030)
pH: 6 (ref 5.0–8.0)

## 2016-09-15 LAB — TROPONIN I: Troponin I: <0.03 ng/mL (ref <0.03)

## 2016-09-15 MED ORDER — PROMETHAZINE HCL 25 MG/ML IJ SOLN
12.5000 mg | Freq: Once | INTRAMUSCULAR | Status: AC
  Administered 2016-09-15: 12.5 mg via INTRAVENOUS
  Filled 2016-09-15: qty 1

## 2016-09-15 MED ORDER — LORAZEPAM 2 MG/ML IJ SOLN
1.0000 mg | Freq: Once | INTRAMUSCULAR | Status: AC
  Administered 2016-09-15: 1 mg via INTRAVENOUS
  Filled 2016-09-15: qty 1

## 2016-09-15 MED ORDER — LORAZEPAM 2 MG/ML IJ SOLN
INTRAMUSCULAR | Status: AC
  Filled 2016-09-15: qty 1

## 2016-09-15 MED ORDER — MECLIZINE HCL 12.5 MG PO TABS: 12.5000 mg | ORAL_TABLET | Freq: Three times a day (TID) | ORAL | 0 refills | Status: DC | PRN

## 2016-09-15 MED ORDER — MECLIZINE HCL 12.5 MG PO TABS
25.0000 mg | ORAL_TABLET | Freq: Once | ORAL | Status: AC
  Administered 2016-09-15: 25 mg via ORAL
  Filled 2016-09-15: qty 2

## 2016-09-15 MED ORDER — SODIUM CHLORIDE 0.9 % IV BOLUS (SEPSIS)
1000.0000 mL | Freq: Once | INTRAVENOUS | Status: AC
  Administered 2016-09-15: 1000 mL via INTRAVENOUS

## 2016-09-15 MED ORDER — PROMETHAZINE HCL 25 MG PO TABS: 25.0000 mg | ORAL_TABLET | Freq: Four times a day (QID) | ORAL | 0 refills | Status: DC | PRN

## 2016-09-15 NOTE — ED Triage Notes (Signed)
Pt reports dizziness, difficulty swallowing, tongue tingling.  Pt symptoms started 0630 this morning.  Pt also having sob, denies cp. Denies unilateral weakness, slightly blurred vision. Pt is alert and oriented.

## 2016-09-15 NOTE — ED Provider Notes (Signed)
[facility] DEPT Provider Note   CSN: [id number] Arrival date & time: 09/15/16  1818     History   Chief Complaint Chief Complaint  Patient presents with  . Dizziness    HPI [name] is a 60 y.o. female.  Pt presents to the ED tonight with dizziness and sob.  The pt said that she woke up around 0630 and felt like something was stuck in her throat.  She felt like her tongue was tingling and she had difficulty swallowing.  After that, she developed the dizziness.  The dizziness is worse with movement.       Past Medical History:  Diagnosis Date  . Asthma   . Diverticulitis   . Hypertension     Patient Active Problem List   Diagnosis Date Noted  . CAP (community acquired pneumonia) 05/04/2015  . Sepsis ([place]) 05/02/2015  . Acute respiratory failure ([place]) 05/02/2015    Past Surgical History:  Procedure Laterality Date  . ABLATION COLPOCLESIS    . CHOLECYSTECTOMY      OB History    Gravida Para Term Preterm AB Living   3 2 2   1      SAB TAB Ectopic Multiple Live Births   1               Home Medications    Prior to Admission medications   Medication Sig Start Date End Date Taking? Authorizing Provider  albuterol (PROVENTIL HFA;VENTOLIN HFA) 108 (90 Base) MCG/ACT inhaler Inhale 1-2 puffs into the lungs every 6 (six) hours as needed for wheezing or shortness of breath.   Yes Historical Provider, MD  ALPRAZolam Duanne Moron) 1 MG tablet Take 2 mg by mouth at bedtime.   Yes Historical Provider, MD  triamterene-hydrochlorothiazide (MAXZIDE) 75-50 MG per tablet Take 0.5 tablets by mouth daily.   Yes Historical Provider, MD  meclizine (ANTIVERT) 12.5 MG tablet Take 1 tablet (12.5 mg total) by mouth 3 (three) times daily as needed for dizziness. 09/15/16   Isla Pence, MD  promethazine (PHENERGAN) 25 MG tablet Take 1 tablet (25 mg total) by mouth every 6 (six) hours as needed for nausea or vomiting. 09/15/16   Isla Pence, MD    Family History Family History   Problem Relation Age of Onset  . Emphysema Mother   . Stroke Father   . Hypertension Father   . Diabetes Sister   . Hypertension Sister   . Stroke Brother   . Hypertension Brother     Social History Social History  Substance Use Topics  . Smoking status: Never Smoker  . Smokeless tobacco: Never Used  . Alcohol use No     Allergies   Iohexol; Latex; Benadryl [diphenhydramine]; and Sulfa antibiotics   Review of Systems Review of Systems  Respiratory: Positive for shortness of breath.   Gastrointestinal: Positive for nausea.  Neurological: Positive for dizziness.     Physical Exam Updated Vital Signs BP 139/70 (BP Location: Right Arm)   Pulse 69   Temp 98 F (36.7 C) (Oral)   Resp 18   Ht 4\' 11"  (1.499 m)   Wt 178 lb (80.7 kg)   SpO2 95%   BMI 35.95 kg/m   Physical Exam  Constitutional: She is oriented to person, place, and time. She appears well-developed and well-nourished.  HENT:  Head: Normocephalic and atraumatic.  Right Ear: External ear normal.  Left Ear: External ear normal.  Nose: Nose normal.  Mouth/Throat: Oropharynx is clear and moist.  Eyes: Conjunctivae  and EOM are normal. Pupils are equal, round, and reactive to light.  Dizzy with eom  Neck: Normal range of motion. Neck supple.  Cardiovascular: Normal rate, regular rhythm, normal heart sounds and intact distal pulses.   Pulmonary/Chest: Effort normal and breath sounds normal.  Abdominal: Soft. Bowel sounds are normal.  Musculoskeletal: Normal range of motion.  Neurological: She is alert and oriented to person, place, and time. She displays tremor.  Skin: Skin is warm and dry.  Psychiatric: She has a normal mood and affect. Her behavior is normal. Judgment and thought content normal.  Nursing note and vitals reviewed.    ED Treatments / Results  Labs (all labs ordered are listed, but only abnormal results are displayed) Labs Reviewed  BASIC METABOLIC PANEL - Abnormal; Notable for the  following:       Result Value   Potassium 3.4 (*)    Glucose, Bld 108 (*)    Creatinine, Ser 1.07 (*)    Calcium 10.4 (*)    GFR calc non Af Amer 54 (*)    All other components within normal limits  URINALYSIS, ROUTINE W REFLEX MICROSCOPIC (NOT AT [facility]) - Abnormal; Notable for the following:    Color, Urine STRAW (*)    Specific Gravity, Urine <1.005 (*)    All other components within normal limits  CBG MONITORING, ED - Abnormal; Notable for the following:    Glucose-Capillary 111 (*)    All other components within normal limits  CBC  TROPONIN I    EKG  EKG Interpretation  Date/Time:  Tuesday September 15 2016 18:33:01 EDT Ventricular Rate:  83 PR Interval:  134 QRS Duration: 110 QT Interval:  366 QTC Calculation: 430 R Axis:   -43 Text Interpretation:  Normal sinus rhythm Left axis deviation Left ventricular hypertrophy Abnormal ECG No significant change since last tracing Confirmed by [facility] MD, Jahden Schara ([id number]) on 09/15/2016 6:36:24 PM       Radiology Dg Chest 2 View  Result Date: 09/15/2016 CLINICAL DATA:  60 year old female with shortness of breath EXAM: CHEST  2 VIEW COMPARISON:  Chest radiograph dated 05/03/2015 FINDINGS: There is shallow inspiration with minimal bibasilar atelectatic changes. There is no focal consolidation, pleural effusion, or pneumothorax. Top-normal cardiac size. Minimal central vascular prominence may represent mild vascular congestion. No acute osseous pathology identified. IMPRESSION: Shallow inspiration.  No focal consolidation or pneumothorax. Electronically Signed   By: Anner Crete M.D.   On: 09/15/2016 21:04   Ct Head Wo Contrast  Result Date: 09/15/2016 CLINICAL DATA:  Dizziness, difficulty swallowing, tingling of the tongue EXAM: CT HEAD WITHOUT CONTRAST TECHNIQUE: Contiguous axial images were obtained from the base of the skull through the vertex without intravenous contrast. COMPARISON:  05/23/2012 FINDINGS: Brain: No evidence of  acute infarction, hemorrhage, hydrocephalus, extra-axial collection or mass lesion/mass effect. Vascular: No hyperdense vessel or unexpected calcification. Skull: No osseous abnormality. Sinuses/Orbits: Visualized paranasal sinuses are clear. Visualized mastoid sinuses are clear. Visualized orbits demonstrate no focal abnormality. Other: None IMPRESSION: No acute intracranial pathology. Electronically Signed   By: Kathreen Devoid   On: 09/15/2016 20:54    Procedures Procedures (including critical care time)  Medications Ordered in ED Medications  LORazepam (ATIVAN) 2 MG/ML injection (not administered)  sodium chloride 0.9 % bolus 1,000 mL (0 mLs Intravenous Stopped 09/15/16 2050)  meclizine (ANTIVERT) tablet 25 mg (25 mg Oral Given 09/15/16 1907)  promethazine (PHENERGAN) injection 12.5 mg (12.5 mg Intravenous Given 09/15/16 1852)  LORazepam (ATIVAN) injection 1 mg (1  mg Intravenous Given 09/15/16 1853)     Initial Impression / Assessment and Plan / ED Course  I have reviewed the triage vital signs and the nursing notes.  Pertinent labs & imaging results that were available during my care of the patient were reviewed by me and considered in my medical decision making (see chart for details).  Clinical Course   Pt feels much better.  She knows to return if worse.  Final Clinical Impressions(s) / ED Diagnoses   Final diagnoses:  Vertigo    New Prescriptions New Prescriptions   MECLIZINE (ANTIVERT) 12.5 MG TABLET    Take 1 tablet (12.5 mg total) by mouth 3 (three) times daily as needed for dizziness.   PROMETHAZINE (PHENERGAN) 25 MG TABLET    Take 1 tablet (25 mg total) by mouth every 6 (six) hours as needed for nausea or vomiting.     Isla Pence, MD 09/15/16 2121

## 2016-09-15 NOTE — ED Triage Notes (Signed)
EKG given to Dr Haviland. 

## 2016-09-15 NOTE — ED Notes (Signed)
Pt just left with Debbie C. of radiology for CT and xray. Telemetry in place.

## 2016-09-15 NOTE — ED Notes (Signed)
Pt discharged from the ED, ambulatory with no signs of distress. Pt verbalized discharge instructions. 

## 2016-09-15 NOTE — ED Notes (Signed)
Pt states blurred vision after going to restroom and having orthostatic vitals taken.

## 2017-03-23 ENCOUNTER — Other Ambulatory Visit (HOSPITAL_COMMUNITY): Payer: Self-pay | Admitting: *Deleted

## 2017-03-23 DIAGNOSIS — Z1231 Encounter for screening mammogram for malignant neoplasm of breast: Secondary | ICD-10-CM

## 2017-03-29 ENCOUNTER — Ambulatory Visit (HOSPITAL_COMMUNITY)
Admission: RE | Admit: 2017-03-29 | Discharge: 2017-03-29 | Disposition: A | Discharge status: Home / Self Care | Payer: No Typology Code available for payment source | Source: Ambulatory Visit | Attending: *Deleted | Admitting: *Deleted

## 2017-03-29 DIAGNOSIS — Z1231 Encounter for screening mammogram for malignant neoplasm of breast: Secondary | ICD-10-CM | POA: Insufficient documentation

## 2017-05-24 ENCOUNTER — Emergency Department (HOSPITAL_COMMUNITY): Payer: Self-pay

## 2017-05-24 ENCOUNTER — Observation Stay (HOSPITAL_COMMUNITY)
Admission: EM | Admit: 2017-05-24 | Discharge: 2017-05-25 | Disposition: A | Discharge status: Home / Self Care | Payer: Self-pay | Attending: Internal Medicine | Admitting: Internal Medicine

## 2017-05-24 ENCOUNTER — Encounter (HOSPITAL_COMMUNITY): Payer: Self-pay | Admitting: Emergency Medicine

## 2017-05-24 DIAGNOSIS — N179 Acute kidney failure, unspecified: Secondary | ICD-10-CM | POA: Diagnosis present

## 2017-05-24 DIAGNOSIS — K5792 Diverticulitis of intestine, part unspecified, without perforation or abscess without bleeding: Principal | ICD-10-CM | POA: Insufficient documentation

## 2017-05-24 DIAGNOSIS — F419 Anxiety disorder, unspecified: Secondary | ICD-10-CM | POA: Insufficient documentation

## 2017-05-24 DIAGNOSIS — Z79899 Other long term (current) drug therapy: Secondary | ICD-10-CM | POA: Insufficient documentation

## 2017-05-24 DIAGNOSIS — H81399 Other peripheral vertigo, unspecified ear: Secondary | ICD-10-CM | POA: Insufficient documentation

## 2017-05-24 DIAGNOSIS — E876 Hypokalemia: Secondary | ICD-10-CM | POA: Insufficient documentation

## 2017-05-24 DIAGNOSIS — I10 Essential (primary) hypertension: Secondary | ICD-10-CM | POA: Diagnosis present

## 2017-05-24 DIAGNOSIS — R42 Dizziness and giddiness: Secondary | ICD-10-CM | POA: Insufficient documentation

## 2017-05-24 DIAGNOSIS — J45909 Unspecified asthma, uncomplicated: Secondary | ICD-10-CM | POA: Insufficient documentation

## 2017-05-24 HISTORY — DX: Anxiety disorder, unspecified: F41.9

## 2017-05-24 HISTORY — DX: Other seasonal allergic rhinitis: J30.2

## 2017-05-24 HISTORY — DX: Headache, unspecified: R51.9

## 2017-05-24 HISTORY — DX: Dizziness and giddiness: R42

## 2017-05-24 HISTORY — DX: Headache: R51

## 2017-05-24 LAB — URINALYSIS, ROUTINE W REFLEX MICROSCOPIC
BILIRUBIN URINE: NEGATIVE
Bacteria, UA: NONE SEEN
Glucose, UA: NEGATIVE mg/dL
HGB URINE DIPSTICK: NEGATIVE
Ketones, ur: NEGATIVE mg/dL
Nitrite: NEGATIVE
PH: 7 (ref 5.0–8.0)
Protein, ur: NEGATIVE mg/dL
Specific Gravity, Urine: 1.023 (ref 1.005–1.030)

## 2017-05-24 LAB — CBC WITH DIFFERENTIAL/PLATELET
BASOS ABS: 0 10*3/uL (ref 0.0–0.1)
Basophils Relative: 0 %
EOS PCT: 2 %
Eosinophils Absolute: 0.2 10*3/uL (ref 0.0–0.7)
HEMATOCRIT: 40 % (ref 36.0–46.0)
Hemoglobin: 13 g/dL (ref 12.0–15.0)
LYMPHS PCT: 28 %
Lymphs Abs: 3.3 10*3/uL (ref 0.7–4.0)
MCH: 27.1 pg (ref 26.0–34.0)
MCHC: 32.5 g/dL (ref 30.0–36.0)
MCV: 83.3 fL (ref 78.0–100.0)
Monocytes Absolute: 0.8 10*3/uL (ref 0.1–1.0)
Monocytes Relative: 7 %
NEUTROS ABS: 7.4 10*3/uL (ref 1.7–7.7)
Neutrophils Relative %: 63 %
PLATELETS: 264 10*3/uL (ref 150–400)
RBC: 4.8 MIL/uL (ref 3.87–5.11)
RDW: 14.4 % (ref 11.5–15.5)
WBC: 11.8 10*3/uL — AB (ref 4.0–10.5)

## 2017-05-24 LAB — RAPID URINE DRUG SCREEN, HOSP PERFORMED
Amphetamines: NOT DETECTED
BARBITURATES: NOT DETECTED
Benzodiazepines: POSITIVE — AB
COCAINE: NOT DETECTED
Opiates: NOT DETECTED

## 2017-05-24 LAB — COMPREHENSIVE METABOLIC PANEL
ALT: 22 U/L (ref 14–54)
ANION GAP: 9 (ref 5–15)
AST: 24 U/L (ref 15–41)
Albumin: 4.2 g/dL (ref 3.5–5.0)
Alkaline Phosphatase: 91 U/L (ref 38–126)
BILIRUBIN TOTAL: 1 mg/dL (ref 0.3–1.2)
BUN: 12 mg/dL (ref 6–20)
CO2: 29 mmol/L (ref 22–32)
Calcium: 10.5 mg/dL — ABNORMAL HIGH (ref 8.9–10.3)
Chloride: 99 mmol/L — ABNORMAL LOW (ref 101–111)
Creatinine, Ser: 1.2 mg/dL — ABNORMAL HIGH (ref 0.44–1.00)
GFR, EST AFRICAN AMERICAN: 55 mL/min — AB (ref >60)
GFR, EST NON AFRICAN AMERICAN: 47 mL/min — AB (ref >60)
Glucose, Bld: 105 mg/dL — ABNORMAL HIGH (ref 65–99)
POTASSIUM: 2.7 mmol/L — AB (ref 3.5–5.1)
Sodium: 137 mmol/L (ref 135–145)
TOTAL PROTEIN: 7.9 g/dL (ref 6.5–8.1)

## 2017-05-24 LAB — ETHANOL: Alcohol, Ethyl (B): <5 mg/dL (ref <5)

## 2017-05-24 LAB — CBG MONITORING, ED: Glucose-Capillary: 104 mg/dL — ABNORMAL HIGH (ref 65–99)

## 2017-05-24 LAB — I-STAT CG4 LACTIC ACID, ED: LACTIC ACID, VENOUS: 1.19 mmol/L (ref 0.5–1.9)

## 2017-05-24 LAB — TROPONIN I: Troponin I: <0.03 ng/mL (ref <0.03)

## 2017-05-24 LAB — ACETAMINOPHEN LEVEL

## 2017-05-24 LAB — LIPASE, BLOOD: Lipase: 29 U/L (ref 11–51)

## 2017-05-24 LAB — SALICYLATE LEVEL: Salicylate Lvl: <7 mg/dL (ref 2.8–30.0)

## 2017-05-24 MED ORDER — CIPROFLOXACIN IN D5W 400 MG/200ML IV SOLN
400.0000 mg | Freq: Once | INTRAVENOUS | Status: AC
  Administered 2017-05-24: 400 mg via INTRAVENOUS
  Filled 2017-05-24: qty 200

## 2017-05-24 MED ORDER — MORPHINE SULFATE (PF) 2 MG/ML IV SOLN
1.0000 mg | INTRAVENOUS | Status: DC | PRN
  Administered 2017-05-24: 1 mg via INTRAVENOUS
  Filled 2017-05-24: qty 1

## 2017-05-24 MED ORDER — ONDANSETRON HCL 4 MG PO TABS: 4.0000 mg | ORAL_TABLET | Freq: Four times a day (QID) | ORAL | Status: DC | PRN

## 2017-05-24 MED ORDER — METRONIDAZOLE IN NACL 5-0.79 MG/ML-% IV SOLN
500.0000 mg | Freq: Once | INTRAVENOUS | Status: AC
  Administered 2017-05-24: 500 mg via INTRAVENOUS
  Filled 2017-05-24: qty 100

## 2017-05-24 MED ORDER — MECLIZINE HCL 12.5 MG PO TABS: 12.5000 mg | ORAL_TABLET | Freq: Three times a day (TID) | ORAL | Status: DC | PRN

## 2017-05-24 MED ORDER — ACETAMINOPHEN 650 MG RE SUPP: 650.0000 mg | Freq: Four times a day (QID) | RECTAL | Status: DC | PRN

## 2017-05-24 MED ORDER — POTASSIUM CHLORIDE CRYS ER 20 MEQ PO TBCR
40.0000 meq | EXTENDED_RELEASE_TABLET | Freq: Four times a day (QID) | ORAL | Status: DC
  Administered 2017-05-25 (×3): 40 meq via ORAL
  Filled 2017-05-24 (×3): qty 2

## 2017-05-24 MED ORDER — POTASSIUM CHLORIDE 10 MEQ/100ML IV SOLN
10.0000 meq | INTRAVENOUS | Status: AC
  Administered 2017-05-24: 10 meq via INTRAVENOUS
  Filled 2017-05-24: qty 100

## 2017-05-24 MED ORDER — ALPRAZOLAM 1 MG PO TABS
2.0000 mg | ORAL_TABLET | Freq: Every day | ORAL | Status: DC
  Administered 2017-05-24: 2 mg via ORAL
  Filled 2017-05-24: qty 2

## 2017-05-24 MED ORDER — SODIUM CHLORIDE 0.9 % IV SOLN: INTRAVENOUS | Status: DC

## 2017-05-24 MED ORDER — ACETAMINOPHEN 325 MG PO TABS: 650.0000 mg | ORAL_TABLET | Freq: Four times a day (QID) | ORAL | Status: DC | PRN

## 2017-05-24 MED ORDER — ENOXAPARIN SODIUM 40 MG/0.4ML ~~LOC~~ SOLN
40.0000 mg | SUBCUTANEOUS | Status: DC
  Administered 2017-05-24: 40 mg via SUBCUTANEOUS
  Filled 2017-05-24: qty 0.4

## 2017-05-24 MED ORDER — PROMETHAZINE HCL 25 MG/ML IJ SOLN
12.5000 mg | Freq: Once | INTRAMUSCULAR | Status: AC
  Administered 2017-05-24: 12.5 mg via INTRAVENOUS
  Filled 2017-05-24: qty 1

## 2017-05-24 MED ORDER — SODIUM CHLORIDE 0.9 % IV BOLUS (SEPSIS)
1000.0000 mL | Freq: Once | INTRAVENOUS | Status: AC
  Administered 2017-05-24: 1000 mL via INTRAVENOUS

## 2017-05-24 MED ORDER — ONDANSETRON HCL 4 MG/2ML IJ SOLN: 4.0000 mg | Freq: Four times a day (QID) | INTRAMUSCULAR | Status: DC | PRN

## 2017-05-24 MED ORDER — LACTATED RINGERS IV SOLN
INTRAVENOUS | Status: DC
  Administered 2017-05-24: 20:00:00 via INTRAVENOUS

## 2017-05-24 MED ORDER — POTASSIUM CHLORIDE 10 MEQ/100ML IV SOLN
10.0000 meq | Freq: Once | INTRAVENOUS | Status: AC
  Administered 2017-05-24: 10 meq via INTRAVENOUS
  Filled 2017-05-24: qty 100

## 2017-05-24 MED ORDER — ALBUTEROL SULFATE (2.5 MG/3ML) 0.083% IN NEBU: 3.0000 mL | INHALATION_SOLUTION | Freq: Four times a day (QID) | RESPIRATORY_TRACT | Status: DC | PRN

## 2017-05-24 NOTE — Progress Notes (Signed)
At 2330, pt called out about arm hurting, upon entering room pt was sitting on side of bed and had removed PIV. Pt stated it was hurting. Left arm had infiltrated and edema noted. Warm compress applied and MD notified. Will continue to monitor.

## 2017-05-24 NOTE — ED Notes (Signed)
I called son. States he spoke with her around 48am. Son states pt was not confused and was talking normal.

## 2017-05-24 NOTE — ED Triage Notes (Addendum)
Pt comes in with lower abdominal pain starting yesterday. Pt has trouble producing urine. She is having dizziness as well, this started yesterday that worsens with movement. Pt states she has vomited once. Pt alert in triage. Is oriented to person, disoriented to place (thinks she is in the breakroom at work).  She keeps looking a picture on the wall in triage and states it is moving really fast and that " it may fall down."

## 2017-05-24 NOTE — H&P (Addendum)
History and Physical    SEJAL COFIELD KYH:[id number] DOB: 06-17-1956 DOA: 05/24/2017  PCP: Lemmie Evens, MD Consultants:  None Patient coming from: Home, lives alone; NOK: son or daughter, [phone]  Chief Complaint: abdominal pain  HPI: [name] is a 61 y.o. female with medical history significant of vertigo, diverticulitis, HTN, and anxiety presenting with abdominal pain and dizziness.  Symptoms started yesterday with abdominal pain.  Slight nausea, some vomiting, no diarrhea.  Symptoms worsened last night, severe abdominal pain at the bottom all the way across. Only vomited once, clear, this AM.  Pain has continued to worsen.  Denies known fever but feels very cold.  Dizziness with everything spinning around, started about 4pm.  Dizziness comes and goes but persists.     ED Course:  IVF and Phenergan with improved BP and vertigo. IV Cipro/Flagyl for acute diverticulitis.  Given IV potassium.  Review of Systems: As per HPI; otherwise review of systems reviewed and negative.   Ambulatory Status:  Ambulates without assistance  Past Medical History:  Diagnosis Date  . Anxiety   . Asthma   . Diverticulitis   . Headache   . Hypertension   . Seasonal allergies   . Vertigo     Past Surgical History:  Procedure Laterality Date  . ABLATION COLPOCLESIS    . CHOLECYSTECTOMY      Social History   Social History  . Marital status: Widowed    Spouse name: N/A  . Number of children: N/A  . Years of education: N/A   Occupational History  . Production line    Social History Main Topics  . Smoking status: Never Smoker  . Smokeless tobacco: Never Used  . Alcohol use No  . Drug use: No  . Sexual activity: Not on file   Other Topics Concern  . Not on file   Social History Narrative  . No narrative on file    Allergies  Allergen Reactions  . Iohexol Anaphylaxis       . Latex Other (See Comments)    Causes dark spots on skin, but not pain, or irritation.     . Benadryl [Diphenhydramine] Rash    Liquid. Pt states she can take pills.  . Sulfa Antibiotics Rash    Minor Shaking    Family History  Problem Relation Age of Onset  . Emphysema Mother   . Stroke Father   . Hypertension Father   . Diabetes Sister   . Hypertension Sister   . Stroke Brother   . Hypertension Brother     Prior to Admission medications   Medication Sig Start Date End Date Taking? Authorizing Provider  albuterol (PROVENTIL HFA;VENTOLIN HFA) 108 (90 Base) MCG/ACT inhaler Inhale 1-2 puffs into the lungs every 6 (six) hours as needed for wheezing or shortness of breath.   Yes [provider]  ALPRAZolam Duanne Moron) 1 MG tablet Take 2 mg by mouth at bedtime.   Yes [provider]  promethazine (PHENERGAN) 25 MG tablet Take 1 tablet (25 mg total) by mouth every 6 (six) hours as needed for nausea or vomiting. 09/15/16  Yes Isla Pence, MD  triamterene-hydrochlorothiazide (MAXZIDE) 75-50 MG per tablet Take 0.5 tablets by mouth daily.   Yes [provider]  meclizine (ANTIVERT) 12.5 MG tablet Take 1 tablet (12.5 mg total) by mouth 3 (three) times daily as needed for dizziness. 09/15/16   Isla Pence, MD    Physical Exam: Vitals:   05/24/17 1700 05/24/17 1730 05/24/17 1833  05/24/17 2100  BP: 116/60 (!) 122/55 (!) 124/51 (!) 119/58  Pulse: 85 86 85 84  Resp: 20 (!) 22 16 18   Temp:   98.6 F (37 C) 98.5 F (36.9 C)  TempSrc:   Oral Oral  SpO2: 98% 96% 98% 97%  Weight:   76.7 kg (169 lb 1.6 oz)   Height:   4\' 11"  (1.499 m)      General:  Appears calm and comfortable and is NAD Eyes:  PERRL, EOMI, normal lids, iris ENT:  grossly normal hearing, lips & tongue, mmm Neck:  no LAD, masses or thyromegaly Cardiovascular:  RRR, no m/r/g. No LE edema.  Respiratory:  CTA bilaterally, no w/r/r. Normal respiratory effort. Abdomen:  soft, tender primarily along B LQ; nd, NABS Skin:  no rash or induration seen on limited exam Musculoskeletal:   grossly normal tone BUE/BLE, good ROM, no bony abnormality Psychiatric:  grossly normal mood and affect, speech fluent and appropriate, AOx3 Neurologic:  CN 2-12 grossly intact, moves all extremities in coordinated fashion, sensation intact  Labs on Admission: I have personally reviewed following labs and imaging studies  CBC:  Recent Labs Lab 05/24/17 1350  WBC 11.8*  NEUTROABS 7.4  HGB 13.0  HCT 40.0  MCV 83.3  PLT 132   Basic Metabolic Panel:  Recent Labs Lab 05/24/17 1350  NA 137  K 2.7*  CL 99*  CO2 29  GLUCOSE 105*  BUN 12  CREATININE 1.20*  CALCIUM 10.5*   GFR: Estimated Creatinine Clearance: 43.4 mL/min (A) (by C-G formula based on SCr of 1.2 mg/dL (H)). Liver Function Tests:  Recent Labs Lab 05/24/17 1350  AST 24  ALT 22  ALKPHOS 91  BILITOT 1.0  PROT 7.9  ALBUMIN 4.2    Recent Labs Lab 05/24/17 1350  LIPASE 29   No results for input(s): AMMONIA in the last 168 hours. Coagulation Profile: No results for input(s): INR, PROTIME in the last 168 hours. Cardiac Enzymes:  Recent Labs Lab 05/24/17 1350  TROPONINI <0.03   BNP (last 3 results) No results for input(s): PROBNP in the last 8760 hours. HbA1C: No results for input(s): HGBA1C in the last 72 hours. CBG:  Recent Labs Lab 05/24/17 1343  GLUCAP 104*   Lipid Profile: No results for input(s): CHOL, HDL, LDLCALC, TRIG, CHOLHDL, LDLDIRECT in the last 72 hours. Thyroid Function Tests: No results for input(s): TSH, T4TOTAL, FREET4, T3FREE, THYROIDAB in the last 72 hours. Anemia Panel: No results for input(s): VITAMINB12, FOLATE, FERRITIN, TIBC, IRON, RETICCTPCT in the last 72 hours. Urine analysis:    Component Value Date/Time   COLORURINE YELLOW 05/24/2017 [place] 05/24/2017 1358   LABSPEC 1.023 05/24/2017 1358   PHURINE 7.0 05/24/2017 1358   GLUCOSEU NEGATIVE 05/24/2017 1358   HGBUR NEGATIVE 05/24/2017 1358   BILIRUBINUR NEGATIVE 05/24/2017 1358   KETONESUR  NEGATIVE 05/24/2017 1358   PROTEINUR NEGATIVE 05/24/2017 1358   UROBILINOGEN 0.2 05/01/2015 2127   NITRITE NEGATIVE 05/24/2017 1358   LEUKOCYTESUR TRACE (A) 05/24/2017 1358    Creatinine Clearance: Estimated Creatinine Clearance: 43.4 mL/min (A) (by C-G formula based on SCr of 1.2 mg/dL (H)).  Sepsis Labs: @LABRCNTIP (procalcitonin:4,lacticidven:4) )No results found for this or any previous visit (from the past 240 hour(s)).   Radiological Exams on Admission: Dg Chest 2 View  Result Date: 05/24/2017 CLINICAL DATA:  Dizziness. EXAM: CHEST  2 VIEW COMPARISON:  09/15/2016 FINDINGS: Normal heart size. Unremarkable mediastinal contours. Minimal atelectasis at the right base. There is no  edema, consolidation, effusion, or pneumothorax. Prominent spondylosis. Cholecystectomy clips. IMPRESSION: Minimal atelectasis.  No pneumonia or pulmonary edema. Electronically Signed   By: Monte Fantasia M.D.   On: 05/24/2017 14:37   Ct Head Wo Contrast  Result Date: 05/24/2017 CLINICAL DATA:  Dizziness.  Vomiting. EXAM: CT HEAD WITHOUT CONTRAST TECHNIQUE: Contiguous axial images were obtained from the base of the skull through the vertex without intravenous contrast. COMPARISON:  09/15/2016 FINDINGS: Brain: No evidence of acute infarction, hemorrhage, hydrocephalus, extra-axial collection or mass lesion/mass effect. Vascular: Mild calcific atherosclerotic disease at the skullbase. Skull: Normal. Negative for fracture or focal lesion. Sinuses/Orbits: No acute finding. Other: None. IMPRESSION: No acute intracranial abnormality. Electronically Signed   By: Fidela Salisbury M.D.   On: 05/24/2017 14:32   Ct Renal Stone Study  Result Date: 05/24/2017 CLINICAL DATA:  Lower abdominal pain beginning yesterday. Vomiting. Dysuria. Dizziness. EXAM: CT ABDOMEN AND PELVIS WITHOUT CONTRAST TECHNIQUE: Multidetector CT imaging of the abdomen and pelvis was performed following the standard protocol without IV contrast.  COMPARISON:  09/12/2015 FINDINGS: Lower chest: No acute findings. Hepatobiliary: No masses visualized on this unenhanced exam. Stable mild diffuse hepatic steatosis. Prior cholecystectomy noted. No evidence of biliary dilatation. Pancreas: No mass or inflammatory process visualized on this unenhanced exam. Spleen:  Within normal limits in size. Adrenals/Urinary tract: No evidence of urolithiasis or hydronephrosis. Unremarkable unopacified urinary bladder. Stomach/Bowel: No evidence of bowel obstruction. Mild diverticulitis is seen involving the sigmoid colon. A tiny amount of free fluid is seen in the right adnexa, however there is no evidence of abscess. Diverticulosis also seen involving the hepatic flexure and descending colon. Vascular/Lymphatic: 1.3 cm left paraaortic lymph node on image 33/2 shows no significant change compared to previous studies dating back to 2007, consistent with benign etiology. No other pathologically enlarged lymph nodes identified. No evidence of abdominal aortic aneurysm. Aortic atherosclerosis. Reproductive:  No mass or other significant abnormality. Other:  None. Musculoskeletal:  No suspicious bone lesions identified. IMPRESSION: Mild sigmoid diverticulitis. No evidence of abscess or other complication. Stable incidental findings including aortic atherosclerosis, mild hepatic steatosis, and small left abdominal retroperitoneal lymph node. Electronically Signed   By: Earle Gell M.D.   On: 05/24/2017 14:38    EKG: Independently reviewed.  NSR with rate 93; Incomplete RBBB and LAFB with abnormal R-wave progression, early transition, significant artifact; repeat EKG ordered and pending   Assessment/Plan Principal Problem:   Diverticulitis Active Problems:   AKI (acute kidney injury) ([place])   Hypokalemia   Essential hypertension   Vertigo   Pertinent labs:  UDS - positive for BZD (expected); results unavailable for THC UA negative Lactate 1.19 K+ 2.7 BUN  12/Creatinine 1.20/GFR 55; prior 13/1.07/>60 on 09/15/16 WBC 11.8  Diverticulitis -Patient's symptoms are c/w diverticulitis and her CT supports this as a diagnosis -Her only SIRS criteria is leukocytosis (11.8) -Her lactate is normal -Her CT indicates mild disease and she has not tried outpatient therapy -She also c/o hunger with minimal n/v so it seems reasonable to advance her diet as tolerated. -For now, will give IVF, pain medication with morphine, nausea medication with Zofran, and treat with Cipro/Flagyl for intraabdominal infection -With current mild disease, she may improve quickly and be ready for discharge in the next 24-48 hours  AKI -Mild -Likely related to poor PO intake, N/V, and medication (see below)  Hypokalemia -Patient has been taking Maxzide 75-50 -There is little utility in doses of HCTZ >25 mg daily and the risk of side effects including  hypokalemia increases significantly -Will stop the medication -She received 10 mEq KCl in the ER -Will replete with an additional 60 mEq and recheck K+ in the AM -Will monitor overnight on telemetry due to severe hypokalemia and increased risk for arrythmia  HTN -As noted above, will stop HCTZ -She is borderline hypotensive, which is likely related to volume deficiency -Will rehydrate as above and monitor BP -If needed, would consider an alternate agent for BP control (ACE, Norvasc, etc)  Vertigo -She has chronic vertigo -It is likely exacerbated by her current infection and mild dehydration -Will continue Meclizine and follow   DVT prophylaxis: Lovenox Code Status: DNR - confirmed with patient/family Family Communication: Son and daughter present throughout evaluation  Disposition Plan:  Home once clinically improved Consults called: None  Admission status: It is my clinical opinion that referral for OBSERVATION is reasonable and necessary in this patient based on the above information provided. The aforementioned taken  together are felt to place the patient at high risk for further clinical deterioration. However it is anticipated that the patient may be medically stable for discharge from the hospital within 24 to 48 hours.    Karmen Bongo MD [facility]  If 7PM-7AM, please contact night-coverage [email or web address] Password [email or web address]  05/24/2017, 10:48 PM

## 2017-05-24 NOTE — ED Provider Notes (Signed)
[facility] DEPT Provider Note   CSN: [id number] Arrival date & time: 05/24/17  1318     History   Chief Complaint Chief Complaint  Patient presents with  . Abdominal Pain    lower  . Dizziness    vertigo  . Extremity Weakness    right sided  . Anxiety    HPI LAYAL JAVID is a 61 y.o. female.  HPI  Pt was seen at 1345. Per pt, c/o gradual onset and worsening of persistent lower abd "pain" since yesterday. Has been associated with several episodes of N/V and "a little" dysuria. 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 Denies diarrhea, no fevers, no CP/SOB, no cough, no back pain.   Past Medical History:  Diagnosis Date  . Asthma   . Diverticulitis   . Headache   . Hypertension   . Vertigo     Patient Active Problem List   Diagnosis Date Noted  . CAP (community acquired pneumonia) 05/04/2015  . Sepsis ([place]) 05/02/2015  . Acute respiratory failure ([place]) 05/02/2015    Past Surgical History:  Procedure Laterality Date  . ABLATION COLPOCLESIS    . CHOLECYSTECTOMY      OB History    Gravida Para Term Preterm AB Living   3 2 2   1      SAB TAB Ectopic Multiple Live Births   1               Home Medications    Prior to Admission medications   Medication Sig Start Date End Date Taking? Authorizing Provider  albuterol (PROVENTIL HFA;VENTOLIN HFA) 108 (90 Base) MCG/ACT inhaler Inhale 1-2 puffs into the lungs every 6 (six) hours as needed for wheezing or shortness of breath.    [provider]  ALPRAZolam Duanne Moron) 1 MG tablet Take 2 mg by mouth at bedtime.    [provider]  meclizine (ANTIVERT) 12.5 MG tablet Take 1 tablet (12.5 mg total) by mouth 3 (three) times daily as needed for dizziness. 09/15/16   Isla Pence, MD  promethazine (PHENERGAN) 25 MG tablet Take 1 tablet (25 mg total) by mouth every 6 (six) hours as needed for nausea or vomiting. 09/15/16   Isla Pence, MD  triamterene-hydrochlorothiazide (MAXZIDE) 75-50 MG per tablet Take 0.5 tablets by mouth daily.    [provider]    Family History Family History  Problem Relation Age of Onset  . Emphysema Mother   . Stroke Father   . Hypertension Father   . Diabetes Sister   . Hypertension Sister   . Stroke Brother   . Hypertension Brother     Social History Social History  Substance Use Topics  . Smoking status: Never Smoker  . Smokeless tobacco: Never Used  . Alcohol use No     Allergies   Iohexol; Latex; Benadryl [diphenhydramine]; and Sulfa antibiotics   Review of Systems Review of Systems ROS: Statement: All systems negative except as marked or noted in the HPI; Constitutional: Negative for fever and chills. ; ; Eyes: Negative for eye pain, redness and discharge. ; ; ENMT: Negative for ear pain, hoarseness, nasal congestion, sinus pressure and sore throat. ; ; Cardiovascular: Negative for chest pain, palpitations, diaphoresis, dyspnea and peripheral edema. ; ; Respiratory: Negative for cough, wheezing and stridor. ; ; Gastrointestinal: +N/V, abd pain. Negative for diarrhea, blood in stool, hematemesis, jaundice and rectal bleeding. . ; ; Genitourinary: +dysuria.  Negative for flank pain and hematuria. ; ; Musculoskeletal: Negative for back pain and neck pain. Negative for swelling and trauma.; ; Skin: Negative for pruritus, rash, abrasions, blisters, bruising and skin lesion.; ; Neuro: +vertigo, extremity weakness. Negative for headache, lightheadedness and neck stiffness. Negative for altered level of consciousness, altered mental status, paresthesias, involuntary movement, seizure and syncope. ; Psych:  +anxiety. No SI, no SA, no HI, no hallucinations.     Physical Exam Updated Vital Signs BP (!) 102/91   Pulse 88   Temp 98.3 F (36.8 C) (Oral)   Resp 17   Ht 4\' 11"  (1.499 m)   Wt 77.1 kg (170 lb)   SpO2 98%   BMI 34.34 kg/m   Patient Vitals for the past  24 hrs:  BP Temp Temp src Pulse Resp SpO2 Height Weight  05/24/17 1530 (!) 120/47 - - 84 (!) 27 95 % - -  05/24/17 1515 - - - 85 (!) 23 97 % - -  05/24/17 1500 123/67 - - 83 17 98 % - -  05/24/17 1430 - - - 86 (!) 24 100 % - -  05/24/17 1400 (!) 102/91 - - 88 17 98 % - -  05/24/17 1330 - - - - - - 4\' 11"  (1.499 m) 77.1 kg (170 lb)  05/24/17 1329 (!) 94/59 98.3 F (36.8 C) Oral 99 18 99 % - -     Physical Exam 1350: Physical examination:  Nursing notes reviewed; Vital signs and O2 SAT reviewed;  Constitutional: Well developed, Well nourished, Well hydrated, Uncomfortable appearing; Head:  Normocephalic, atraumatic; Eyes: EOMI, PERRL, No scleral icterus; ENMT: Mouth and pharynx normal, Mucous membranes moist; Neck: Supple, Full range of motion, No lymphadenopathy; Cardiovascular: Regular rate and rhythm, No gallop; Respiratory: Breath sounds clear & equal bilaterally, No wheezes.  Speaking full sentences with ease, Normal respiratory effort/excursion; Chest: Nontender, Movement normal; Abdomen: Soft, +RUQ, RLQ, > suprapubic, LLQ tenderness to palp.  Nondistended, Normal bowel sounds; Genitourinary: No CVA tenderness; Extremities: Pulses normal, No tenderness, No edema, No calf edema or asymmetry.; Neuro: AA&Ox3, Major CN grossly intact. Speech clear.  No facial droop.  +left horizontal end gaze fatigable nystagmus which reproduces pt's symptoms. Grips L>R. Strength 4/5 RUE and RLE, strength 5/5 LUE and LLE.  No gross sensory deficits.  Normal cerebellar testing bilat UE's (finger-nose) and RLE (heel-shin), weak RLE..; Skin: Color normal, Warm, Dry.; Psych:  Anxious, tremulous.     ED Treatments / Results  Labs (all labs ordered are listed, but only abnormal results are displayed)   EKG  EKG Interpretation  Date/Time:  Monday May 24 2017 13:51:51 EDT Ventricular Rate:  93 PR Interval:    QRS Duration: 108 QT Interval:  343 QTC Calculation: 427 R Axis:   -48 Text Interpretation:   Normal sinus rhythm Incomplete RBBB and LAFB Abnormal R-wave progression, early transition Left ventricular hypertrophy Artifact Poor data quality suggest repeat tracing Confirmed by [facility]  MD, Nunzio Cory [phone]) on 05/24/2017 2:19:39 PM       Radiology   Procedures Procedures (including critical care time)  Medications Ordered in ED Medications  promethazine (PHENERGAN) injection 12.5 mg (not administered)     Initial Impression / Assessment and Plan / ED Course  I have reviewed the triage vital signs and the nursing notes.  Pertinent labs & imaging results that were available during my care of the patient were reviewed by me and considered in my medical decision making (see chart for details).  MDM  Reviewed: previous chart, nursing note and vitals Reviewed previous: labs and ECG Interpretation: labs, ECG, x-ray and CT scan   Results for orders placed or performed during the hospital encounter of 05/24/17  Comprehensive metabolic panel  Result Value Ref Range   Sodium 137 135 - 145 mmol/L   Potassium 2.7 (LL) 3.5 - 5.1 mmol/L   Chloride 99 (L) 101 - 111 mmol/L   CO2 29 22 - 32 mmol/L   Glucose, Bld 105 (H) 65 - 99 mg/dL   BUN 12 6 - 20 mg/dL   Creatinine, Ser 1.20 (H) 0.44 - 1.00 mg/dL   Calcium 10.5 (H) 8.9 - 10.3 mg/dL   Total Protein 7.9 6.5 - 8.1 g/dL   Albumin 4.2 3.5 - 5.0 g/dL   AST 24 15 - 41 U/L   ALT 22 14 - 54 U/L   Alkaline Phosphatase 91 38 - 126 U/L   Total Bilirubin 1.0 0.3 - 1.2 mg/dL   GFR calc non Af Amer 47 (L) >60 mL/min   GFR calc Af Amer 55 (L) >60 mL/min   Anion gap 9 5 - 15  CBC with Differential  Result Value Ref Range   WBC 11.8 (H) 4.0 - 10.5 K/uL   RBC 4.80 3.87 - 5.11 MIL/uL   Hemoglobin 13.0 12.0 - 15.0 g/dL   HCT 40.0 36.0 - 46.0 %   MCV 83.3 78.0 - 100.0 fL   MCH 27.1 26.0 - 34.0 pg   MCHC 32.5 30.0 - 36.0 g/dL   RDW 14.4 11.5 - 15.5 %   Platelets 264 150 - 400 K/uL   Neutrophils Relative % 63 %   Neutro Abs 7.4 1.7 - 7.7 K/uL    Lymphocytes Relative 28 %   Lymphs Abs 3.3 0.7 - 4.0 K/uL   Monocytes Relative 7 %   Monocytes Absolute 0.8 0.1 - 1.0 K/uL   Eosinophils Relative 2 %   Eosinophils Absolute 0.2 0.0 - 0.7 K/uL   Basophils Relative 0 %   Basophils Absolute 0.0 0.0 - 0.1 K/uL  Urinalysis, Routine w reflex microscopic  Result Value Ref Range   Color, Urine YELLOW YELLOW   APPearance CLEAR CLEAR   Specific Gravity, Urine 1.023 1.005 - 1.030   pH 7.0 5.0 - 8.0   Glucose, UA NEGATIVE NEGATIVE mg/dL   Hgb urine dipstick NEGATIVE NEGATIVE   Bilirubin Urine NEGATIVE NEGATIVE   Ketones, ur NEGATIVE NEGATIVE mg/dL   Protein, ur NEGATIVE NEGATIVE mg/dL   Nitrite NEGATIVE NEGATIVE   Leukocytes, UA TRACE (A) NEGATIVE   RBC / HPF 0-5 0 - 5 RBC/hpf   WBC, UA 0-5 0 - 5 WBC/hpf   Bacteria, UA NONE SEEN NONE SEEN   Squamous Epithelial / LPF 0-5 (A) NONE SEEN   Mucous PRESENT   Urine rapid drug screen (hosp performed)  Result Value Ref Range   Opiates NONE DETECTED NONE DETECTED   Cocaine NONE DETECTED NONE DETECTED   Benzodiazepines POSITIVE (A) NONE DETECTED   Amphetamines NONE DETECTED NONE DETECTED   Tetrahydrocannabinol RESULTS UNAVAILABLE DUE TO INTERFERING SUBSTANCE (A) NONE DETECTED   Barbiturates NONE DETECTED NONE DETECTED  Troponin I  Result Value Ref Range   Troponin I <0.03 <0.03 ng/mL  Acetaminophen level  Result Value Ref Range   Acetaminophen (Tylenol), Serum <10 (L) 10 - 30 ug/mL  Ethanol  Result Value Ref Range   Alcohol, Ethyl (B) <5 <5 mg/dL  Lipase, blood  Result Value Ref Range   Lipase 29 11 -  51 U/L  Salicylate level  Result Value Ref Range   Salicylate Lvl <9.3 2.8 - 30.0 mg/dL  CBG monitoring, ED  Result Value Ref Range   Glucose-Capillary 104 (H) 65 - 99 mg/dL  I-Stat CG4 Lactic Acid, ED  Result Value Ref Range   Lactic Acid, Venous 1.19 0.5 - 1.9 mmol/L   Dg Chest 2 View Result Date: 05/24/2017 CLINICAL DATA:  Dizziness. EXAM: CHEST  2 VIEW COMPARISON:  09/15/2016  FINDINGS: Normal heart size. Unremarkable mediastinal contours. Minimal atelectasis at the right base. There is no edema, consolidation, effusion, or pneumothorax. Prominent spondylosis. Cholecystectomy clips. IMPRESSION: Minimal atelectasis.  No pneumonia or pulmonary edema. Electronically Signed   By: Monte Fantasia M.D.   On: 05/24/2017 14:37   Ct Head Wo Contrast Result Date: 05/24/2017 CLINICAL DATA:  Dizziness.  Vomiting. EXAM: CT HEAD WITHOUT CONTRAST TECHNIQUE: Contiguous axial images were obtained from the base of the skull through the vertex without intravenous contrast. COMPARISON:  09/15/2016 FINDINGS: Brain: No evidence of acute infarction, hemorrhage, hydrocephalus, extra-axial collection or mass lesion/mass effect. Vascular: Mild calcific atherosclerotic disease at the skullbase. Skull: Normal. Negative for fracture or focal lesion. Sinuses/Orbits: No acute finding. Other: None. IMPRESSION: No acute intracranial abnormality. Electronically Signed   By: Fidela Salisbury M.D.   On: 05/24/2017 14:32   Ct Renal Stone Study Result Date: 05/24/2017 CLINICAL DATA:  Lower abdominal pain beginning yesterday. Vomiting. Dysuria. Dizziness. EXAM: CT ABDOMEN AND PELVIS WITHOUT CONTRAST TECHNIQUE: Multidetector CT imaging of the abdomen and pelvis was performed following the standard protocol without IV contrast. COMPARISON:  09/12/2015 FINDINGS: Lower chest: No acute findings. Hepatobiliary: No masses visualized on this unenhanced exam. Stable mild diffuse hepatic steatosis. Prior cholecystectomy noted. No evidence of biliary dilatation. Pancreas: No mass or inflammatory process visualized on this unenhanced exam. Spleen:  Within normal limits in size. Adrenals/Urinary tract: No evidence of urolithiasis or hydronephrosis. Unremarkable unopacified urinary bladder. Stomach/Bowel: No evidence of bowel obstruction. Mild diverticulitis is seen involving the sigmoid colon. A tiny amount of free fluid is seen in  the right adnexa, however there is no evidence of abscess. Diverticulosis also seen involving the hepatic flexure and descending colon. Vascular/Lymphatic: 1.3 cm left paraaortic lymph node on image 33/2 shows no significant change compared to previous studies dating back to 2007, consistent with benign etiology. No other pathologically enlarged lymph nodes identified. No evidence of abdominal aortic aneurysm. Aortic atherosclerosis. Reproductive:  No mass or other significant abnormality. Other:  None. Musculoskeletal:  No suspicious bone lesions identified. IMPRESSION: Mild sigmoid diverticulitis. No evidence of abscess or other complication. Stable incidental findings including aortic atherosclerosis, mild hepatic steatosis, and small left abdominal retroperitoneal lymph node. Electronically Signed   By: Earle Gell M.D.   On: 05/24/2017 14:38    1350:  Pt appears nervous, anxious, darting eyes around the room. I asked pt if she was having hallucinations and she stated "no" just that she "was very anxious" especially "around so many people I don't know."  When asked regarding her right sided weakness, pt states it began yesterday and she believes her weakness is due to abd pain.   1430:  Family is now at bedside: confirms that pt behaves like this when she is anxious, in pain, or has a fever.   1550:  IVF and IV phenergan given with improvement in BP and vertigo. IV cipro and flagyl given for acute diverticulitis. Potassium repleted IV. Dx and testing d/w pt and family.  Questions answered.  Verb  understanding, agreeable to admit.  T/C to [facility]. Lorin Mercy, case discussed, including:  HPI, pertinent PM/SHx, VS/PE, dx testing, ED course and treatment:  Agreeable to admit.     Final Clinical Impressions(s) / ED Diagnoses   Final diagnoses:  None    New Prescriptions New Prescriptions   No medications on file     Francine Graven, DO 05/28/17 1302

## 2017-05-24 NOTE — ED Notes (Signed)
edp in with pt 

## 2017-05-25 DIAGNOSIS — N179 Acute kidney failure, unspecified: Secondary | ICD-10-CM

## 2017-05-25 DIAGNOSIS — I10 Essential (primary) hypertension: Secondary | ICD-10-CM

## 2017-05-25 DIAGNOSIS — K5792 Diverticulitis of intestine, part unspecified, without perforation or abscess without bleeding: Secondary | ICD-10-CM

## 2017-05-25 LAB — BASIC METABOLIC PANEL
Anion gap: 6 (ref 5–15)
BUN: 11 mg/dL (ref 6–20)
CHLORIDE: 106 mmol/L (ref 101–111)
CO2: 26 mmol/L (ref 22–32)
CREATININE: 1.09 mg/dL — AB (ref 0.44–1.00)
Calcium: 9.7 mg/dL (ref 8.9–10.3)
GFR, EST NON AFRICAN AMERICAN: 53 mL/min — AB (ref >60)
Glucose, Bld: 112 mg/dL — ABNORMAL HIGH (ref 65–99)
Potassium: 3.3 mmol/L — ABNORMAL LOW (ref 3.5–5.1)
Sodium: 138 mmol/L (ref 135–145)

## 2017-05-25 LAB — CBC
HCT: 37.7 % (ref 36.0–46.0)
Hemoglobin: 12.1 g/dL (ref 12.0–15.0)
MCH: 27 pg (ref 26.0–34.0)
MCHC: 32.1 g/dL (ref 30.0–36.0)
MCV: 84.2 fL (ref 78.0–100.0)
PLATELETS: 242 10*3/uL (ref 150–400)
RBC: 4.48 MIL/uL (ref 3.87–5.11)
RDW: 14.4 % (ref 11.5–15.5)
WBC: 9.4 10*3/uL (ref 4.0–10.5)

## 2017-05-25 LAB — HIV ANTIBODY (ROUTINE TESTING W REFLEX): HIV SCREEN 4TH GENERATION: NONREACTIVE

## 2017-05-25 MED ORDER — TRIAMTERENE-HCTZ 75-50 MG PO TABS
0.5000 | ORAL_TABLET | Freq: Every day | ORAL | Status: DC
Start: 2017-05-25 — End: 2021-04-22

## 2017-05-25 MED ORDER — CIPROFLOXACIN HCL 500 MG PO TABS: 500.0000 mg | ORAL_TABLET | Freq: Two times a day (BID) | ORAL | 0 refills | Status: DC

## 2017-05-25 MED ORDER — CIPROFLOXACIN HCL 250 MG PO TABS
500.0000 mg | ORAL_TABLET | Freq: Two times a day (BID) | ORAL | Status: DC
  Administered 2017-05-25: 500 mg via ORAL
  Filled 2017-05-25: qty 2

## 2017-05-25 MED ORDER — HYDROCODONE-ACETAMINOPHEN 5-325 MG PO TABS
1.0000 | ORAL_TABLET | ORAL | Status: DC | PRN
  Administered 2017-05-25: 1 via ORAL
  Filled 2017-05-25: qty 1

## 2017-05-25 MED ORDER — HYDROCODONE-ACETAMINOPHEN 5-325 MG PO TABS: 1.0000 | ORAL_TABLET | ORAL | 0 refills | Status: DC | PRN

## 2017-05-25 MED ORDER — METRONIDAZOLE 500 MG PO TABS: 500.0000 mg | ORAL_TABLET | Freq: Three times a day (TID) | ORAL | 0 refills | Status: DC

## 2017-05-25 MED ORDER — METRONIDAZOLE 500 MG PO TABS
500.0000 mg | ORAL_TABLET | Freq: Three times a day (TID) | ORAL | Status: DC
  Administered 2017-05-25: 500 mg via ORAL
  Filled 2017-05-25: qty 1

## 2017-05-25 NOTE — Progress Notes (Signed)
Pt refusing another PIV at this time, cold compress applied to infiltrated arm. MD aware, ordered PO potassium.

## 2017-05-25 NOTE — Progress Notes (Signed)
Pt's IV removed during night shift. Pt's IV site clean dry and intact. Discharge instructions including medications and follow up appointments were reviewed and discussed with patient. Pt verbalized understanding of discharge instructions. All questions were answered and no further questions at this time. Pt in stable condition and in no acute distress at time of discharge. Pt escorted by RN.

## 2017-05-25 NOTE — Discharge Summary (Signed)
Physician Discharge Summary  [name] BMW:[id number] DOB: [birth year] DOA: 05/24/2017  PCP: Lemmie Evens, MD  Admit date: 05/24/2017 Discharge date: 05/25/2017  Admitted From: home Disposition:  home  Recommendations for Outpatient Follow-up:  1. Follow up with PCP in 1-2 weeks 2. Please obtain BMP/CBC in one week   Home Health: Equipment/Devices:  Discharge Condition: stable CODE STATUS: full code Diet recommendation: Heart Healthy   Brief/Interim Summary: 61 y/o female who presented with a history of abdominal pain and dizziness. She was evaluated with CT scan that confirmed uncomplicated diverticulitis. She was admitted to the hospital overnight. She was started on intravenous antibiotics and pain management. Diet was advanced to soft foods and she tolerated this well. She has been transitioned to oral antibiotics and will complete a 14 day course. Her pain is controlled with oral pain meds and she is not having any vomiting. Her dizziness is likely vertigo and is improved with meclizine. She feels ready to discharge home.  Discharge Diagnoses:  Principal Problem:   Diverticulitis Active Problems:   AKI (acute kidney injury) ([place])   Hypokalemia   Essential hypertension   Vertigo    Discharge Instructions   Allergies as of 05/25/2017      Reactions   Iohexol Anaphylaxis      Latex Other (See Comments)   Causes dark spots on skin, but not pain, or irritation.    Benadryl [diphenhydramine] Rash   Liquid. Pt states she can take pills.   Sulfa Antibiotics Rash   Minor Shaking      Medication List    TAKE these medications   albuterol 108 (90 Base) MCG/ACT inhaler Commonly known as:  PROVENTIL HFA;VENTOLIN HFA Inhale 1-2 puffs into the lungs every 6 (six) hours as needed for wheezing or shortness of breath.   ALPRAZolam 1 MG tablet Commonly known as:  XANAX Take 2 mg by mouth at bedtime.   ciprofloxacin 500 MG tablet Commonly known as:  CIPRO Take 1 tablet  (500 mg total) by mouth 2 (two) times daily.   HYDROcodone-acetaminophen 5-325 MG tablet Commonly known as:  NORCO/VICODIN Take 1-2 tablets by mouth every 4 (four) hours as needed for moderate pain or severe pain.   meclizine 12.5 MG tablet Commonly known as:  ANTIVERT Take 1 tablet (12.5 mg total) by mouth 3 (three) times daily as needed for dizziness.   metroNIDAZOLE 500 MG tablet Commonly known as:  FLAGYL Take 1 tablet (500 mg total) by mouth every 8 (eight) hours.   promethazine 25 MG tablet Commonly known as:  PHENERGAN Take 1 tablet (25 mg total) by mouth every 6 (six) hours as needed for nausea or vomiting.   triamterene-hydrochlorothiazide 75-50 MG tablet Commonly known as:  MAXZIDE Take 0.5 tablets by mouth daily. Resume in 1 week What changed:  additional instructions       Allergies  Allergen Reactions  . Iohexol Anaphylaxis       . Latex Other (See Comments)    Causes dark spots on skin, but not pain, or irritation.   . Benadryl [Diphenhydramine] Rash    Liquid. Pt states she can take pills.  . Sulfa Antibiotics Rash    Minor Shaking    Consultations:     Procedures/Studies: Dg Chest 2 View  Result Date: 05/24/2017 CLINICAL DATA:  Dizziness. EXAM: CHEST  2 VIEW COMPARISON:  09/15/2016 FINDINGS: Normal heart size. Unremarkable mediastinal contours. Minimal atelectasis at the right base. There is no edema, consolidation, effusion, or pneumothorax. Prominent spondylosis. Cholecystectomy  clips. IMPRESSION: Minimal atelectasis.  No pneumonia or pulmonary edema. Electronically Signed   By: Monte Fantasia M.D.   On: 05/24/2017 14:37   Ct Head Wo Contrast  Result Date: 05/24/2017 CLINICAL DATA:  Dizziness.  Vomiting. EXAM: CT HEAD WITHOUT CONTRAST TECHNIQUE: Contiguous axial images were obtained from the base of the skull through the vertex without intravenous contrast. COMPARISON:  09/15/2016 FINDINGS: Brain: No evidence of acute infarction, hemorrhage,  hydrocephalus, extra-axial collection or mass lesion/mass effect. Vascular: Mild calcific atherosclerotic disease at the skullbase. Skull: Normal. Negative for fracture or focal lesion. Sinuses/Orbits: No acute finding. Other: None. IMPRESSION: No acute intracranial abnormality. Electronically Signed   By: Fidela Salisbury M.D.   On: 05/24/2017 14:32   Ct Renal Stone Study  Result Date: 05/24/2017 CLINICAL DATA:  Lower abdominal pain beginning yesterday. Vomiting. Dysuria. Dizziness. EXAM: CT ABDOMEN AND PELVIS WITHOUT CONTRAST TECHNIQUE: Multidetector CT imaging of the abdomen and pelvis was performed following the standard protocol without IV contrast. COMPARISON:  09/12/2015 FINDINGS: Lower chest: No acute findings. Hepatobiliary: No masses visualized on this unenhanced exam. Stable mild diffuse hepatic steatosis. Prior cholecystectomy noted. No evidence of biliary dilatation. Pancreas: No mass or inflammatory process visualized on this unenhanced exam. Spleen:  Within normal limits in size. Adrenals/Urinary tract: No evidence of urolithiasis or hydronephrosis. Unremarkable unopacified urinary bladder. Stomach/Bowel: No evidence of bowel obstruction. Mild diverticulitis is seen involving the sigmoid colon. A tiny amount of free fluid is seen in the right adnexa, however there is no evidence of abscess. Diverticulosis also seen involving the hepatic flexure and descending colon. Vascular/Lymphatic: 1.3 cm left paraaortic lymph node on image 33/2 shows no significant change compared to previous studies dating back to 2007, consistent with benign etiology. No other pathologically enlarged lymph nodes identified. No evidence of abdominal aortic aneurysm. Aortic atherosclerosis. Reproductive:  No mass or other significant abnormality. Other:  None. Musculoskeletal:  No suspicious bone lesions identified. IMPRESSION: Mild sigmoid diverticulitis. No evidence of abscess or other complication. Stable incidental  findings including aortic atherosclerosis, mild hepatic steatosis, and small left abdominal retroperitoneal lymph node. Electronically Signed   By: Earle Gell M.D.   On: 05/24/2017 14:38       Subjective: No vomiting, feels better, tolerating liquids, wants to advance diet  Discharge Exam: Vitals:   05/25/17 0500 05/25/17 1319  BP: 120/62 (!) 83/49  Pulse: 82 79  Resp: 18 18  Temp: 98.4 F (36.9 C) 98.4 F (36.9 C)   Vitals:   05/24/17 1833 05/24/17 2100 05/25/17 0500 05/25/17 1319  BP: (!) 124/51 (!) 119/58 120/62 (!) 83/49  Pulse: 85 84 82 79  Resp: 16 18 18 18   Temp: 98.6 F (37 C) 98.5 F (36.9 C) 98.4 F (36.9 C) 98.4 F (36.9 C)  TempSrc: Oral Oral Oral Oral  SpO2: 98% 97% 98% 98%  Weight: 76.7 kg (169 lb 1.6 oz)     Height: 4\' 11"  (1.499 m)       General: Pt is alert, awake, not in acute distress Cardiovascular: RRR, S1/S2 +, no rubs, no gallops Respiratory: CTA bilaterally, no wheezing, no rhonchi Abdominal: Soft, tender in lower abdomen and LUQ, ND, bowel sounds + Extremities: no edema, no cyanosis    The results of significant diagnostics from this hospitalization (including imaging, microbiology, ancillary and laboratory) are listed below for reference.     Microbiology: No results found for this or any previous visit (from the past 240 hour(s)).   Labs: BNP (last 3 results) No  results for input(s): BNP in the last 8760 hours. Basic Metabolic Panel:  Recent Labs Lab 05/24/17 1350 05/25/17 0452  NA 137 138  K 2.7* 3.3*  CL 99* 106  CO2 29 26  GLUCOSE 105* 112*  BUN 12 11  CREATININE 1.20* 1.09*  CALCIUM 10.5* 9.7   Liver Function Tests:  Recent Labs Lab 05/24/17 1350  AST 24  ALT 22  ALKPHOS 91  BILITOT 1.0  PROT 7.9  ALBUMIN 4.2    Recent Labs Lab 05/24/17 1350  LIPASE 29   No results for input(s): AMMONIA in the last 168 hours. CBC:  Recent Labs Lab 05/24/17 1350 05/25/17 0452  WBC 11.8* 9.4  NEUTROABS 7.4  --    HGB 13.0 12.1  HCT 40.0 37.7  MCV 83.3 84.2  PLT 264 242   Cardiac Enzymes:  Recent Labs Lab 05/24/17 1350  TROPONINI <0.03   BNP: Invalid input(s): POCBNP CBG:  Recent Labs Lab 05/24/17 1343  GLUCAP 104*   D-Dimer No results for input(s): DDIMER in the last 72 hours. Hgb A1c No results for input(s): HGBA1C in the last 72 hours. Lipid Profile No results for input(s): CHOL, HDL, LDLCALC, TRIG, CHOLHDL, LDLDIRECT in the last 72 hours. Thyroid function studies No results for input(s): TSH, T4TOTAL, T3FREE, THYROIDAB in the last 72 hours.  Invalid input(s): FREET3 Anemia work up No results for input(s): VITAMINB12, FOLATE, FERRITIN, TIBC, IRON, RETICCTPCT in the last 72 hours. Urinalysis    Component Value Date/Time   COLORURINE YELLOW 05/24/2017 1358   APPEARANCEUR CLEAR 05/24/2017 1358   LABSPEC 1.023 05/24/2017 1358   PHURINE 7.0 05/24/2017 1358   GLUCOSEU NEGATIVE 05/24/2017 1358   HGBUR NEGATIVE 05/24/2017 1358   BILIRUBINUR NEGATIVE 05/24/2017 1358   KETONESUR NEGATIVE 05/24/2017 1358   PROTEINUR NEGATIVE 05/24/2017 1358   UROBILINOGEN 0.2 05/01/2015 2127   NITRITE NEGATIVE 05/24/2017 1358   LEUKOCYTESUR TRACE (A) 05/24/2017 1358   Sepsis Labs Invalid input(s): PROCALCITONIN,  WBC,  LACTICIDVEN Microbiology No results found for this or any previous visit (from the past 240 hour(s)).   Time coordinating discharge: Over 30 minutes  SIGNED:   Kathie Dike, MD  [facility] 05/25/2017, 1:58 PM Pager   If 7PM-7AM, please contact night-coverage [email or web address] Password [email or web address]

## 2017-05-26 ENCOUNTER — Encounter: Payer: Self-pay | Admitting: Internal Medicine

## 2017-05-26 LAB — URINE CULTURE: Culture: NO GROWTH

## 2017-08-16 ENCOUNTER — Encounter (INDEPENDENT_AMBULATORY_CARE_PROVIDER_SITE_OTHER): Payer: Self-pay | Admitting: Internal Medicine

## 2017-08-16 ENCOUNTER — Encounter (INDEPENDENT_AMBULATORY_CARE_PROVIDER_SITE_OTHER): Payer: Self-pay

## 2017-09-02 ENCOUNTER — Ambulatory Visit (INDEPENDENT_AMBULATORY_CARE_PROVIDER_SITE_OTHER): Payer: Self-pay | Admitting: Internal Medicine

## 2017-09-13 ENCOUNTER — Ambulatory Visit (INDEPENDENT_AMBULATORY_CARE_PROVIDER_SITE_OTHER): Payer: Self-pay | Admitting: Internal Medicine

## 2017-09-13 ENCOUNTER — Encounter (INDEPENDENT_AMBULATORY_CARE_PROVIDER_SITE_OTHER): Payer: Self-pay | Admitting: Internal Medicine

## 2017-09-13 VITALS — BP 150/100 | HR 60 | Temp 97.6°F | Ht 59.0 in | Wt 173.8 lb

## 2017-09-13 DIAGNOSIS — K5732 Diverticulitis of large intestine without perforation or abscess without bleeding: Secondary | ICD-10-CM

## 2017-09-13 NOTE — Progress Notes (Addendum)
   Subjective:    Patient ID: [name], female    DOB: 12-Jan-1956, 61 y.o.   MRN: [id number]  HPI Referred by Dr. Karie Kirks for diverticulitis/colonoscopy. Hx of diverticulitis and her last bout was in June.  Admitted to [facility] in June with diverticulitis.  In 2013, was evaluated by Dr. Arnoldo Morale for  recurrent diverticulitis.    She says she has diverticulitis about every 2-3 months and keeps Cipro and Flagyl on hand at all times.  Her appetite is good. No weight loss.   Usually has a BM daily.  Her last colonoscopy was in 2007 for diagnostic and screening purposes. (diverticulitis).   Sigmoid colon diverticulosis (moderate).  Incidental finding of small  submucosal lipoma and the transverse colon.  Small external hemorrhoids.   Review of Systems Past Medical History:  Diagnosis Date  . Anxiety   . Asthma   . Diverticulitis   . Headache   . Hypertension   . Seasonal allergies   . Vertigo     Past Surgical History:  Procedure Laterality Date  . ABLATION COLPOCLESIS    . CHOLECYSTECTOMY      Allergies  Allergen Reactions  . Iohexol Anaphylaxis       . Latex Other (See Comments)    Causes dark spots on skin, but not pain, or irritation.   . Benadryl [Diphenhydramine] Rash    Liquid. Pt states she can take pills.  . Sulfa Antibiotics Rash    Minor Shaking    Current Outpatient Prescriptions on File Prior to Visit  Medication Sig Dispense Refill  . albuterol (PROVENTIL HFA;VENTOLIN HFA) 108 (90 Base) MCG/ACT inhaler Inhale 1-2 puffs into the lungs every 6 (six) hours as needed for wheezing or shortness of breath.    . ALPRAZolam (XANAX) 1 MG tablet Take 2 mg by mouth at bedtime.    Marland Kitchen HYDROcodone-acetaminophen (NORCO/VICODIN) 5-325 MG tablet Take 1-2 tablets by mouth every 4 (four) hours as needed for moderate pain or severe pain. 15 tablet 0  . meclizine (ANTIVERT) 12.5 MG tablet Take 1 tablet (12.5 mg total) by mouth 3 (three) times daily as needed for dizziness. 30 tablet  0  . triamterene-hydrochlorothiazide (MAXZIDE) 75-50 MG tablet Take 0.5 tablets by mouth daily. Resume in 1 week     No current facility-administered medications on file prior to visit.         Objective:   Physical Exam Blood pressure (!) 150/100, pulse 60, temperature 97.6 F (36.4 C), height 4\' 11"  (1.499 m), weight 173 lb 12.8 oz (78.8 kg). Alert and oriented. Skin warm and dry. Oral mucosa is moist.   . Sclera anicteric, conjunctivae is pink. Thyroid not enlarged. No cervical lymphadenopathy. Lungs clear. Heart regular rate and rhythm.  Abdomen is soft. Bowel sounds are positive. No hepatomegaly. No abdominal masses felt. No tenderness.  No edema to lower extremities.          Assessment & Plan:  Diverticulitis. Long hx of same.  Last colonoscopy was in 2007. Needs surveillance colonoscopy. Colonic neoplasm needs to be ruled out.

## 2017-09-13 NOTE — Patient Instructions (Signed)
Colonoscopy. The risks of bleeding, perforation and infection were reviewed with patient.  

## 2017-09-15 ENCOUNTER — Other Ambulatory Visit (INDEPENDENT_AMBULATORY_CARE_PROVIDER_SITE_OTHER): Payer: Self-pay | Admitting: Internal Medicine

## 2017-09-15 ENCOUNTER — Encounter (INDEPENDENT_AMBULATORY_CARE_PROVIDER_SITE_OTHER): Payer: Self-pay | Admitting: *Deleted

## 2017-09-15 DIAGNOSIS — K5732 Diverticulitis of large intestine without perforation or abscess without bleeding: Secondary | ICD-10-CM | POA: Insufficient documentation

## 2017-09-22 NOTE — Patient Instructions (Addendum)
[name]  09/22/2017     @PREFPERIOPPHARMACY @   Your procedure is scheduled on   10/04/2017   Report to [facility] at  1145  A.M.  Call this number if you have problems the morning of surgery:  [phone]   Remember:  Do not eat food or drink liquids after midnight.  Take these medicines the morning of surgery with A SIP OF WATER : Maxzide Hydrocodone and antivert.   Do not wear jewelry, make-up or nail polish.  Do not wear lotions, powders, or perfumes, or deoderant.  Do not shave 48 hours prior to surgery.  Men may shave face and neck.  Do not bring valuables to the hospital.  [facility] is not responsible for any belongings or valuables.  Contacts, dentures or bridgework may not be worn into surgery.  Leave your suitcase in the car.  After surgery it may be brought to your room.  For patients admitted to the hospital, discharge time will be determined by your treatment team.  Patients discharged the day of surgery will not be allowed to drive home.   Name and phone number of your driver:   family Special instructions:  Follow the diet and prep instructions given to you by Dr Olevia Perches office/  Please read over the following fact sheets that you were given. Anesthesia Post-op Instructions and Care and Recovery After Surgery       Colonoscopy, Adult A colonoscopy is an exam to look at the entire large intestine. During the exam, a lubricated, bendable tube is inserted into the anus and then passed into the rectum, colon, and other parts of the large intestine. A colonoscopy is often done as a part of normal colorectal screening or in response to certain symptoms, such as anemia, persistent diarrhea, abdominal pain, and blood in the stool. The exam can help screen for and diagnose medical problems, including:  Tumors.  Polyps.  Inflammation.  Areas of bleeding.  Tell a health care provider about:  Any allergies you have.  All medicines  you are taking, including vitamins, herbs, eye drops, creams, and over-the-counter medicines.  Any problems you or family members have had with anesthetic medicines.  Any blood disorders you have.  Any surgeries you have had.  Any medical conditions you have.  Any problems you have had passing stool. What are the risks? Generally, this is a safe procedure. However, problems may occur, including:  Bleeding.  A tear in the intestine.  A reaction to medicines given during the exam.  Infection (rare).  What happens before the procedure? Eating and drinking restrictions Follow instructions from your health care provider about eating and drinking, which may include:  A few days before the procedure - follow a low-fiber diet. Avoid nuts, seeds, dried fruit, raw fruits, and vegetables.  1-3 days before the procedure - follow a clear liquid diet. Drink only clear liquids, such as clear broth or bouillon, black coffee or tea, clear juice, clear soft drinks or sports drinks, gelatin dessert, and popsicles. Avoid any liquids that contain red or purple dye.  On the day of the procedure - do not eat or drink anything during the 2 hours before the procedure, or within the time period that your health care provider recommends.  Bowel prep If you were prescribed an oral bowel prep to clean out your colon:  Take it as told by your health care provider. Starting the day before  your procedure, you will need to drink a large amount of medicated liquid. The liquid will cause you to have multiple loose stools until your stool is almost clear or light green.  If your skin or anus gets irritated from diarrhea, you may use these to relieve the irritation: ? Medicated wipes, such as adult wet wipes with aloe and vitamin E. ? A skin soothing-product like petroleum jelly.  If you vomit while drinking the bowel prep, take a break for up to 60 minutes and then begin the bowel prep again. If vomiting  continues and you cannot take the bowel prep without vomiting, call your health care provider.  General instructions  Ask your health care provider about changing or stopping your regular medicines. This is especially important if you are taking diabetes medicines or blood thinners.  Plan to have someone take you home from the hospital or clinic. What happens during the procedure?  An IV tube may be inserted into one of your veins.  You will be given medicine to help you relax (sedative).  To reduce your risk of infection: ? Your health care team will wash or sanitize their hands. ? Your anal area will be washed with soap.  You will be asked to lie on your side with your knees bent.  Your health care provider will lubricate a long, thin, flexible tube. The tube will have a camera and a light on the end.  The tube will be inserted into your anus.  The tube will be gently eased through your rectum and colon.  Air will be delivered into your colon to keep it open. You may feel some pressure or cramping.  The camera will be used to take images during the procedure.  A small tissue sample may be removed from your body to be examined under a microscope (biopsy). If any potential problems are found, the tissue will be sent to a lab for testing.  If small polyps are found, your health care provider may remove them and have them checked for cancer cells.  The tube that was inserted into your anus will be slowly removed. The procedure may vary among health care providers and hospitals. What happens after the procedure?  Your blood pressure, heart rate, breathing rate, and blood oxygen level will be monitored until the medicines you were given have worn off.  Do not drive for 24 hours after the exam.  You may have a small amount of blood in your stool.  You may pass gas and have mild abdominal cramping or bloating due to the air that was used to inflate your colon during the  exam.  It is up to you to get the results of your procedure. Ask your health care provider, or the department performing the procedure, when your results will be ready. This information is not intended to replace advice given to you by your health care provider. Make sure you discuss any questions you have with your health care provider. Document Released: 12/04/2000 Document Revised: 10/07/2016 Document Reviewed: 02/18/2016 Elsevier Interactive Patient Education  2018 [employer].  Colonoscopy, Adult, Care After This sheet gives you information about how to care for yourself after your procedure. Your health care provider may also give you more specific instructions. If you have problems or questions, contact your health care provider. What can I expect after the procedure? After the procedure, it is common to have:  A small amount of blood in your stool for 24 hours after the  procedure.  Some gas.  Mild abdominal cramping or bloating.  Follow these instructions at home: General instructions   For the first 24 hours after the procedure: ? Do not drive or use machinery. ? Do not sign important documents. ? Do not drink alcohol. ? Do your regular daily activities at a slower pace than normal. ? Eat soft, easy-to-digest foods. ? Rest often.  Take over-the-counter or prescription medicines only as told by your health care provider.  It is up to you to get the results of your procedure. Ask your health care provider, or the department performing the procedure, when your results will be ready. Relieving cramping and bloating  Try walking around when you have cramps or feel bloated.  Apply heat to your abdomen as told by your health care provider. Use a heat source that your health care provider recommends, such as a moist heat pack or a heating pad. ? Place a towel between your skin and the heat source. ? Leave the heat on for 20-30 minutes. ? Remove the heat if your skin turns  bright red. This is especially important if you are unable to feel pain, heat, or cold. You may have a greater risk of getting burned. Eating and drinking  Drink enough fluid to keep your urine clear or pale yellow.  Resume your normal diet as instructed by your health care provider. Avoid heavy or fried foods that are hard to digest.  Avoid drinking alcohol for as long as instructed by your health care provider. Contact a health care provider if:  You have blood in your stool 2-3 days after the procedure. Get help right away if:  You have more than a small spotting of blood in your stool.  You pass large blood clots in your stool.  Your abdomen is swollen.  You have nausea or vomiting.  You have a fever.  You have increasing abdominal pain that is not relieved with medicine. This information is not intended to replace advice given to you by your health care provider. Make sure you discuss any questions you have with your health care provider. Document Released: 07/21/2004 Document Revised: 08/31/2016 Document Reviewed: 02/18/2016 Elsevier Interactive Patient Education  2018 [place] Anesthesia is a term that refers to techniques, procedures, and medicines that help a person stay safe and comfortable during a medical procedure. Monitored anesthesia care, or sedation, is one type of anesthesia. Your anesthesia specialist may recommend sedation if you will be having a procedure that does not require you to be unconscious, such as:  Cataract surgery.  A dental procedure.  A biopsy.  A colonoscopy.  During the procedure, you may receive a medicine to help you relax (sedative). There are three levels of sedation:  Mild sedation. At this level, you may feel awake and relaxed. You will be able to follow directions.  Moderate sedation. At this level, you will be sleepy. You may not remember the procedure.  Deep sedation. At this level, you will be  asleep. You will not remember the procedure.  The more medicine you are given, the deeper your level of sedation will be. Depending on how you respond to the procedure, the anesthesia specialist may change your level of sedation or the type of anesthesia to fit your needs. An anesthesia specialist will monitor you closely during the procedure. Let your health care provider know about:  Any allergies you have.  All medicines you are taking, including vitamins, herbs, eye drops,  creams, and over-the-counter medicines.  Any use of steroids (by mouth or as a cream).  Any problems you or family members have had with sedatives and anesthetic medicines.  Any blood disorders you have.  Any surgeries you have had.  Any medical conditions you have, such as sleep apnea.  Whether you are pregnant or may be pregnant.  Any use of cigarettes, alcohol, or street drugs. What are the risks? Generally, this is a safe procedure. However, problems may occur, including:  Getting too much medicine (oversedation).  Nausea.  Allergic reaction to medicines.  Trouble breathing. If this happens, a breathing tube may be used to help with breathing. It will be removed when you are awake and breathing on your own.  Heart trouble.  Lung trouble.  Before the procedure Staying hydrated Follow instructions from your health care provider about hydration, which may include:  Up to 2 hours before the procedure - you may continue to drink clear liquids, such as water, clear fruit juice, black coffee, and plain tea.  Eating and drinking restrictions Follow instructions from your health care provider about eating and drinking, which may include:  8 hours before the procedure - stop eating heavy meals or foods such as meat, fried foods, or fatty foods.  6 hours before the procedure - stop eating light meals or foods, such as toast or cereal.  6 hours before the procedure - stop drinking milk or drinks that  contain milk.  2 hours before the procedure - stop drinking clear liquids.  Medicines Ask your health care provider about:  Changing or stopping your regular medicines. This is especially important if you are taking diabetes medicines or blood thinners.  Taking medicines such as aspirin and ibuprofen. These medicines can thin your blood. Do not take these medicines before your procedure if your health care provider instructs you not to.  Tests and exams  You will have a physical exam.  You may have blood tests done to show: ? How well your kidneys and liver are working. ? How well your blood can clot.  General instructions  Plan to have someone take you home from the hospital or clinic.  If you will be going home right after the procedure, plan to have someone with you for 24 hours.  What happens during the procedure?  Your blood pressure, heart rate, breathing, level of pain and overall condition will be monitored.  An IV tube will be inserted into one of your veins.  Your anesthesia specialist will give you medicines as needed to keep you comfortable during the procedure. This may mean changing the level of sedation.  The procedure will be performed. After the procedure  Your blood pressure, heart rate, breathing rate, and blood oxygen level will be monitored until the medicines you were given have worn off.  Do not drive for 24 hours if you received a sedative.  You may: ? Feel sleepy, clumsy, or nauseous. ? Feel forgetful about what happened after the procedure. ? Have a sore throat if you had a breathing tube during the procedure. ? Vomit. This information is not intended to replace advice given to you by your health care provider. Make sure you discuss any questions you have with your health care provider. Document Released: 09/02/2005 Document Revised: 05/15/2016 Document Reviewed: 03/29/2016 Elsevier Interactive Patient Education  2018 [place], Care After These instructions provide you with information about caring for yourself after your procedure. Your health care provider  may also give you more specific instructions. Your treatment has been planned according to current medical practices, but problems sometimes occur. Call your health care provider if you have any problems or questions after your procedure. What can I expect after the procedure? After your procedure, it is common to:  Feel sleepy for several hours.  Feel clumsy and have poor balance for several hours.  Feel forgetful about what happened after the procedure.  Have poor judgment for several hours.  Feel nauseous or vomit.  Have a sore throat if you had a breathing tube during the procedure.  Follow these instructions at home: For at least 24 hours after the procedure:   Do not: ? Participate in activities in which you could fall or become injured. ? Drive. ? Use heavy machinery. ? Drink alcohol. ? Take sleeping pills or medicines that cause drowsiness. ? Make important decisions or sign legal documents. ? Take care of children on your own.  Rest. Eating and drinking  Follow the diet that is recommended by your health care provider.  If you vomit, drink water, juice, or soup when you can drink without vomiting.  Make sure you have little or no nausea before eating solid foods. General instructions  Have a responsible adult stay with you until you are awake and alert.  Take over-the-counter and prescription medicines only as told by your health care provider.  If you smoke, do not smoke without supervision.  Keep all follow-up visits as told by your health care provider. This is important. Contact a health care provider if:  You keep feeling nauseous or you keep vomiting.  You feel light-headed.  You develop a rash.  You have a fever. Get help right away if:  You have trouble breathing. This information is not  intended to replace advice given to you by your health care provider. Make sure you discuss any questions you have with your health care provider. Document Released: 03/29/2016 Document Revised: 07/29/2016 Document Reviewed: 03/29/2016 Elsevier Interactive Patient Education  [employer].

## 2017-09-23 ENCOUNTER — Emergency Department (HOSPITAL_COMMUNITY)
Admission: EM | Admit: 2017-09-23 | Discharge: 2017-09-23 | Disposition: A | Discharge status: Home / Self Care | Payer: Self-pay | Attending: Emergency Medicine | Admitting: Emergency Medicine

## 2017-09-23 ENCOUNTER — Encounter (HOSPITAL_COMMUNITY): Payer: Self-pay | Admitting: *Deleted

## 2017-09-23 DIAGNOSIS — J45909 Unspecified asthma, uncomplicated: Secondary | ICD-10-CM | POA: Insufficient documentation

## 2017-09-23 DIAGNOSIS — Z91040 Latex allergy status: Secondary | ICD-10-CM | POA: Insufficient documentation

## 2017-09-23 DIAGNOSIS — L249 Irritant contact dermatitis, unspecified cause: Secondary | ICD-10-CM | POA: Insufficient documentation

## 2017-09-23 MED ORDER — TRIAMCINOLONE ACETONIDE 0.1 % EX CREA: 1.0000 "application " | TOPICAL_CREAM | Freq: Two times a day (BID) | CUTANEOUS | 0 refills | Status: DC

## 2017-09-23 NOTE — ED Triage Notes (Signed)
Pt comes in with bilateral rash on her neck starting Sunday. Pt states it burns and itches, no blisters noted. Pt states she has a lot of allergies. Denies any new used of cleaners, soaps, or perfumes.

## 2017-09-23 NOTE — Discharge Instructions (Signed)
As discussed I suspect you are having a sensitivity reaction causing this rash.  Apply the medicine as prescribed. Get rechecked if not improving over the next 10 days. You may apply cool compresses to the site, topical benadryl cream can also help with itching.

## 2017-09-23 NOTE — ED Notes (Signed)
Rash on upper chest, and arms, worse at times, states it was purple yesterday. Some itching, not constant.

## 2017-09-23 NOTE — ED Notes (Signed)
Patient given discharge instruction, verbalized understanding. Patient ambulatory out of the department.

## 2017-09-23 NOTE — ED Provider Notes (Signed)
[facility] DEPT Provider Note   CSN: [id number] Arrival date & time: 09/23/17  1324     History   Chief Complaint Chief Complaint  Patient presents with  . Rash    HPI [name] is a 61 y.o. female with a history of asthma, seasonal allergies and is "allergic to lots of things" with skin sensitivity presenting with an itchy, burning rash around her neck which she woke with 5 days ago. She does not recognize any new activities, contact exposures or new foods or medications.  She was sent home from work today "in case she is contagious". She denies fevers, chills, cough, sob, sore throat. She has applied vaseline to the rash which caused increased burning.  The history is provided by the patient.    Past Medical History:  Diagnosis Date  . Anxiety   . Asthma   . Diverticulitis   . Headache   . Hypertension   . Seasonal allergies   . Vertigo     Patient Active Problem List   Diagnosis Date Noted  . Diverticulitis of colon 09/15/2017  . Diverticulitis 05/24/2017  . AKI (acute kidney injury) ([place]) 05/24/2017  . Hypokalemia 05/24/2017  . Essential hypertension 05/24/2017  . Vertigo 05/24/2017  . CAP (community acquired pneumonia) 05/04/2015  . Sepsis ([place]) 05/02/2015  . Acute respiratory failure ([place]) 05/02/2015    Past Surgical History:  Procedure Laterality Date  . ABDOMINAL HYSTERECTOMY    . ABLATION COLPOCLESIS    . CHOLECYSTECTOMY      OB History    Gravida Para Term Preterm AB Living   3 2 2   1      SAB TAB Ectopic Multiple Live Births   1               Home Medications    Prior to Admission medications   Medication Sig Start Date End Date Taking? Authorizing Provider  albuterol (PROVENTIL HFA;VENTOLIN HFA) 108 (90 Base) MCG/ACT inhaler Inhale 1-2 puffs into the lungs every 6 (six) hours as needed for wheezing or shortness of breath.    [provider]  ALPRAZolam Duanne Moron) 1 MG tablet Take 2 mg by mouth at bedtime.    [provider]  HYDROcodone-acetaminophen (NORCO/VICODIN) 5-325 MG tablet Take 1-2 tablets by mouth every 4 (four) hours as needed for moderate pain or severe pain. 05/25/17   Kathie Dike, MD  meclizine (ANTIVERT) 12.5 MG tablet Take 1 tablet (12.5 mg total) by mouth 3 (three) times daily as needed for dizziness. 09/15/16   Isla Pence, MD  triamcinolone cream (KENALOG) 0.1 % Apply 1 application topically 2 (two) times daily. Apply sparingly to neck rash for up to 10 days. 09/23/17   Evalee Jefferson, PA-C  triamterene-hydrochlorothiazide (MAXZIDE) 75-50 MG tablet Take 0.5 tablets by mouth daily. Resume in 1 week 05/25/17   Kathie Dike, MD    Family History Family History  Problem Relation Age of Onset  . Emphysema Mother   . Stroke Father   . Hypertension Father   . Diabetes Sister   . Hypertension Sister   . Stroke Brother   . Hypertension Brother     Social History Social History  Substance Use Topics  . Smoking status: Never Smoker  . Smokeless tobacco: Never Used  . Alcohol use No     Allergies   Iohexol; Latex; Benadryl [diphenhydramine]; and Sulfa antibiotics   Review of Systems Review of Systems  Constitutional: Negative for chills and fever.  Respiratory: Negative for  shortness of breath and wheezing.   Skin: Positive for rash.  Neurological: Negative for numbness.     Physical Exam Updated Vital Signs There were no vitals taken for this visit.  Physical Exam  Constitutional: She appears well-developed and well-nourished. No distress.  HENT:  Head: Normocephalic.  Neck: Neck supple.  Cardiovascular: Normal rate.   Pulmonary/Chest: Effort normal. She has no wheezes.  Musculoskeletal: Normal range of motion. She exhibits no edema.  Skin: Rash noted.  Very fine, maculopapular rash bilateral neck crease.  No edema, erythema, drainage, no vesicles, no satellite lesions.      ED Treatments / Results  Labs (all labs ordered are listed, but only abnormal  results are displayed) Labs Reviewed - No data to display  EKG  EKG Interpretation None       Radiology No results found.  Procedures Procedures (including critical care time)  Medications Ordered in ED Medications - No data to display   Initial Impression / Assessment and Plan / ED Course  I have reviewed the triage vital signs and the nursing notes.  Pertinent labs & imaging results that were available during my care of the patient were reviewed by me and considered in my medical decision making (see chart for details).     Suspect irritant or contact dermatitis.  No sx suggesting infectious source, no vesicles, no satellite lesions to suggest yeast or fungal infection. Triamcinolone used sparingly bid, topical benadryl prn. F/u with pcp if sx persist or worsen.  Final Clinical Impressions(s) / ED Diagnoses   Final diagnoses:  Irritant contact dermatitis, unspecified trigger    New Prescriptions Discharge Medication List as of 09/23/2017  3:13 PM    START taking these medications   Details  triamcinolone cream (KENALOG) 0.1 % Apply 1 application topically 2 (two) times daily. Apply sparingly to neck rash for up to 10 days., Starting Thu 09/23/2017, Print         Evalee Jefferson, PA-C 09/23/17 1735    Daleen Bo, MD 09/26/17 [phone]

## 2017-09-28 ENCOUNTER — Encounter (HOSPITAL_COMMUNITY)
Admission: RE | Admit: 2017-09-28 | Discharge: 2017-09-28 | Disposition: A | Discharge status: Home / Self Care | Payer: Self-pay | Source: Ambulatory Visit | Attending: Internal Medicine | Admitting: Internal Medicine

## 2017-09-28 ENCOUNTER — Encounter (HOSPITAL_COMMUNITY): Payer: Self-pay

## 2017-09-28 DIAGNOSIS — Z01818 Encounter for other preprocedural examination: Secondary | ICD-10-CM | POA: Insufficient documentation

## 2017-09-28 DIAGNOSIS — K5732 Diverticulitis of large intestine without perforation or abscess without bleeding: Secondary | ICD-10-CM | POA: Insufficient documentation

## 2017-09-28 DIAGNOSIS — Z01810 Encounter for preprocedural cardiovascular examination: Secondary | ICD-10-CM | POA: Insufficient documentation

## 2017-09-28 DIAGNOSIS — I4519 Other right bundle-branch block: Secondary | ICD-10-CM | POA: Insufficient documentation

## 2017-09-28 LAB — CBC
HEMATOCRIT: 38.8 % (ref 36.0–46.0)
Hemoglobin: 12.4 g/dL (ref 12.0–15.0)
MCH: 27.9 pg (ref 26.0–34.0)
MCHC: 32 g/dL (ref 30.0–36.0)
MCV: 87.2 fL (ref 78.0–100.0)
Platelets: 280 10*3/uL (ref 150–400)
RBC: 4.45 MIL/uL (ref 3.87–5.11)
RDW: 14.1 % (ref 11.5–15.5)
WBC: 7.6 10*3/uL (ref 4.0–10.5)

## 2017-09-28 LAB — BASIC METABOLIC PANEL
ANION GAP: 7 (ref 5–15)
BUN: 11 mg/dL (ref 6–20)
CALCIUM: 10.3 mg/dL (ref 8.9–10.3)
CO2: 29 mmol/L (ref 22–32)
Chloride: 104 mmol/L (ref 101–111)
Creatinine, Ser: 0.84 mg/dL (ref 0.44–1.00)
GFR calc Af Amer: >60 mL/min (ref >60)
Glucose, Bld: 93 mg/dL (ref 65–99)
POTASSIUM: 3.4 mmol/L — AB (ref 3.5–5.1)
Sodium: 140 mmol/L (ref 135–145)

## 2017-09-30 ENCOUNTER — Other Ambulatory Visit (HOSPITAL_COMMUNITY): Payer: Self-pay

## 2017-10-04 ENCOUNTER — Ambulatory Visit (HOSPITAL_COMMUNITY): Payer: Self-pay | Admitting: Anesthesiology

## 2017-10-04 ENCOUNTER — Encounter (HOSPITAL_COMMUNITY): Payer: Self-pay | Admitting: *Deleted

## 2017-10-04 ENCOUNTER — Encounter (HOSPITAL_COMMUNITY)
Admission: RE | Disposition: A | Discharge status: Home / Self Care | Payer: Self-pay | Source: Ambulatory Visit | Attending: Internal Medicine

## 2017-10-04 ENCOUNTER — Ambulatory Visit (HOSPITAL_COMMUNITY)
Admission: RE | Admit: 2017-10-04 | Discharge: 2017-10-04 | Disposition: A | Discharge status: Home / Self Care | Payer: Self-pay | Source: Ambulatory Visit | Attending: Internal Medicine | Admitting: Internal Medicine

## 2017-10-04 DIAGNOSIS — Z79891 Long term (current) use of opiate analgesic: Secondary | ICD-10-CM | POA: Insufficient documentation

## 2017-10-04 DIAGNOSIS — K5730 Diverticulosis of large intestine without perforation or abscess without bleeding: Secondary | ICD-10-CM | POA: Insufficient documentation

## 2017-10-04 DIAGNOSIS — K5732 Diverticulitis of large intestine without perforation or abscess without bleeding: Secondary | ICD-10-CM | POA: Insufficient documentation

## 2017-10-04 DIAGNOSIS — Z882 Allergy status to sulfonamides status: Secondary | ICD-10-CM | POA: Insufficient documentation

## 2017-10-04 DIAGNOSIS — J45909 Unspecified asthma, uncomplicated: Secondary | ICD-10-CM | POA: Insufficient documentation

## 2017-10-04 DIAGNOSIS — Z79899 Other long term (current) drug therapy: Secondary | ICD-10-CM | POA: Insufficient documentation

## 2017-10-04 DIAGNOSIS — D175 Benign lipomatous neoplasm of intra-abdominal organs: Secondary | ICD-10-CM

## 2017-10-04 DIAGNOSIS — Z8719 Personal history of other diseases of the digestive system: Secondary | ICD-10-CM

## 2017-10-04 DIAGNOSIS — I10 Essential (primary) hypertension: Secondary | ICD-10-CM | POA: Insufficient documentation

## 2017-10-04 DIAGNOSIS — D125 Benign neoplasm of sigmoid colon: Secondary | ICD-10-CM | POA: Insufficient documentation

## 2017-10-04 DIAGNOSIS — F419 Anxiety disorder, unspecified: Secondary | ICD-10-CM | POA: Insufficient documentation

## 2017-10-04 DIAGNOSIS — K644 Residual hemorrhoidal skin tags: Secondary | ICD-10-CM | POA: Insufficient documentation

## 2017-10-04 DIAGNOSIS — Z91040 Latex allergy status: Secondary | ICD-10-CM | POA: Insufficient documentation

## 2017-10-04 HISTORY — PX: POLYPECTOMY: SHX5525

## 2017-10-04 HISTORY — PX: COLONOSCOPY WITH PROPOFOL: SHX5780

## 2017-10-04 SURGERY — COLONOSCOPY WITH PROPOFOL
Anesthesia: Monitor Anesthesia Care

## 2017-10-04 MED ORDER — MIDAZOLAM HCL 2 MG/2ML IJ SOLN
1.0000 mg | INTRAMUSCULAR | Status: AC
  Administered 2017-10-04: 2 mg via INTRAVENOUS

## 2017-10-04 MED ORDER — BENEFIBER DRINK MIX PO PACK: 4.0000 g | PACK | Freq: Every day | ORAL | Status: DC

## 2017-10-04 MED ORDER — CHLORHEXIDINE GLUCONATE CLOTH 2 % EX PADS: 6.0000 | MEDICATED_PAD | Freq: Once | CUTANEOUS | Status: DC

## 2017-10-04 MED ORDER — PROPOFOL 500 MG/50ML IV EMUL
INTRAVENOUS | Status: DC | PRN
  Administered 2017-10-04: 125 ug/kg/min via INTRAVENOUS

## 2017-10-04 MED ORDER — FENTANYL CITRATE (PF) 100 MCG/2ML IJ SOLN
25.0000 ug | Freq: Once | INTRAMUSCULAR | Status: AC
  Administered 2017-10-04: 25 ug via INTRAVENOUS

## 2017-10-04 MED ORDER — STERILE WATER FOR IRRIGATION IR SOLN
Status: DC | PRN
  Administered 2017-10-04: 2.5 mL

## 2017-10-04 MED ORDER — FENTANYL CITRATE (PF) 100 MCG/2ML IJ SOLN
INTRAMUSCULAR | Status: AC
  Filled 2017-10-04: qty 2

## 2017-10-04 MED ORDER — PROPOFOL 10 MG/ML IV BOLUS
INTRAVENOUS | Status: AC
  Filled 2017-10-04: qty 40

## 2017-10-04 MED ORDER — MIDAZOLAM HCL 2 MG/2ML IJ SOLN
INTRAMUSCULAR | Status: AC
  Filled 2017-10-04: qty 2

## 2017-10-04 MED ORDER — SODIUM CHLORIDE BACTERIOSTATIC 0.9 % IJ SOLN
INTRAMUSCULAR | Status: AC
  Filled 2017-10-04: qty 10

## 2017-10-04 MED ORDER — LACTATED RINGERS IV SOLN
INTRAVENOUS | Status: DC
  Administered 2017-10-04: 14:00:00 via INTRAVENOUS

## 2017-10-04 NOTE — H&P (Signed)
[name] is an 61 y.o. female.   Chief Complaint: Patient is here for colonoscopy. HPI: patient is 61 year old African-American female who is here for diagnostic colonoscopy. She states she has had episode of diverticulitis every 2 months this yeashe has respbiotics. She denies diarrhea or conipation. She also denies melena or rectal bleeding.She has lost 60 pounds this year while entirely by walking and exercising. Os colonoscopy was in 2007 revealing sigmoid colon diverticulosis and lipoma at transverse colon. Family History is negative for CRC.  Past Medical History:  Diagnosis Date  . Anxiety   . Asthma   . Diverticulitis   . Headache   . Hypertension   . Seasonal allergies   . Vertigo     Past Surgical History:  Procedure Laterality Date  . ABDOMINAL HYSTERECTOMY    . ABLATION COLPOCLESIS    . CHOLECYSTECTOMY      Family History  Problem Relation Age of Onset  . Emphysema Mother   . Stroke Father   . Hypertension Father   . Diabetes Sister   . Hypertension Sister   . Stroke Brother   . Hypertension Brother    Social History:  reports that she has never smoked. She has never used smokeless tobacco. She reports that she does not drink alcohol or use drugs.  Allergies:  Allergies  Allergen Reactions  . Iohexol Anaphylaxis       . Latex Other (See Comments)    Causes dark spots on skin, but not pain, or irritation.   . Benadryl [Diphenhydramine] Rash    Liquid. Pt states she can take pills.  . Sulfa Antibiotics Rash    Minor Shaking    Medications Prior to Admission  Medication Sig Dispense Refill  . albuterol (PROVENTIL HFA;VENTOLIN HFA) 108 (90 Base) MCG/ACT inhaler Inhale 2 puffs into the lungs every 6 (six) hours as needed for wheezing or shortness of breath.     . ALPRAZolam (XANAX) 1 MG tablet Take 2 mg by mouth at bedtime.    . ciprofloxacin (CIPRO) 500 MG tablet Take 500 mg by mouth 2 (two) times daily as needed (diverticulitis flare ups).    Marland Kitchen  HYDROcodone-acetaminophen (NORCO/VICODIN) 5-325 MG tablet Take 1-2 tablets by mouth every 4 (four) hours as needed for moderate pain or severe pain. 15 tablet 0  . meclizine (ANTIVERT) 12.5 MG tablet Take 1 tablet (12.5 mg total) by mouth 3 (three) times daily as needed for dizziness. 30 tablet 0  . metroNIDAZOLE (FLAGYL) 500 MG tablet Take 500 mg by mouth 2 (two) times daily as needed (for diverticulitis flare ups).    . Multiple Vitamin (MULTIVITAMIN WITH MINERALS) TABS tablet Take 1 tablet by mouth daily.    Marland Kitchen triamcinolone cream (KENALOG) 0.1 % Apply 1 application topically 2 (two) times daily. Apply sparingly to neck rash for up to 10 days. 30 g 0  . triamterene-hydrochlorothiazide (MAXZIDE) 75-50 MG tablet Take 0.5 tablets by mouth daily. Resume in 1 week (Patient taking differently: Take 0.5 tablets by mouth daily. )      No results found for this or any previous visit (from the past 48 hour(s)). No results found.  ROS  Blood pressure 109/73, pulse 72, temperature 98.3 F (36.8 C), temperature source Oral, resp. rate (!) 22, SpO2 100 %. Physical Exam  Constitutional: She appears well-developed and well-nourished.  HENT:  Mouth/Throat: Oropharynx is clear and moist.  Eyes: Conjunctivae are normal.  Neck: No thyromegaly present.  Cardiovascular: Normal rate, regular rhythm and normal  heart sounds.   No murmur heard. Respiratory: Effort normal and breath sounds normal.  GI:  Right subcostal scar. Abdomen is symmetrical soft and nontender without organomegaly or masses.   Musculoskeletal: She exhibits no edema.  Lymphadenopathy:    She has no cervical adenopathy.  Neurological: She is alert.  Skin: Skin is warm and dry.     Assessment/Plan Recurrent sigmoid diverticulitis. Diagnostic colonoscopy.  Hildred Laser, MD 10/04/2017, 2:19 PM

## 2017-10-04 NOTE — Anesthesia Postprocedure Evaluation (Signed)
Anesthesia Post Note  Patient: [name]  Procedure(s) Performed: COLONOSCOPY WITH PROPOFOL (N/A ) POLYPECTOMY  Patient location during evaluation: PACU Anesthesia Type: MAC Level of consciousness: awake and alert and oriented Pain management: pain level controlled Vital Signs Assessment: post-procedure vital signs reviewed and stable Respiratory status: spontaneous breathing Cardiovascular status: blood pressure returned to baseline Postop Assessment: no apparent nausea or vomiting Anesthetic complications: no     Last Vitals:  Vitals:   10/04/17 1416 10/04/17 1501  BP:  (!) 109/49  Pulse:  76  Resp: (!) 22 (!) 21  Temp:  (!) 36.4 C  SpO2: 100% 96%    Last Pain:  Vitals:   10/04/17 1501  TempSrc:   PainSc: 0-No pain                 Greyson Peavy

## 2017-10-04 NOTE — Anesthesia Preprocedure Evaluation (Signed)
Anesthesia Evaluation  Patient identified by MRN, date of birth, ID band Patient awake    Reviewed: Allergy & Precautions, NPO status , Patient's Chart, lab work & pertinent test results  Airway Mallampati: III  TM Distance: >3 FB     Dental  (+) Teeth Intact   Pulmonary asthma ,    breath sounds clear to auscultation       Cardiovascular hypertension, Pt. on medications  Rhythm:Regular Rate:Normal     Neuro/Psych  Headaches, PSYCHIATRIC DISORDERS Anxiety    GI/Hepatic negative GI ROS, Neg liver ROS,   Endo/Other  negative endocrine ROS  Renal/GU      Musculoskeletal negative musculoskeletal ROS (+)   Abdominal   Peds  Hematology negative hematology ROS (+)   Anesthesia Other Findings   Reproductive/Obstetrics                             Anesthesia Physical Anesthesia Plan  ASA: II  Anesthesia Plan: MAC   Post-op Pain Management:    Induction: Intravenous  PONV Risk Score and Plan:   Airway Management Planned: Simple Face Mask  Additional Equipment:   Intra-op Plan:   Post-operative Plan:   Informed Consent: I have reviewed the patients History and Physical, chart, labs and discussed the procedure including the risks, benefits and alternatives for the proposed anesthesia with the patient or authorized representative who has indicated his/her understanding and acceptance.     Plan Discussed with:   Anesthesia Plan Comments:         Anesthesia Quick Evaluation

## 2017-10-04 NOTE — Transfer of Care (Signed)
Immediate Anesthesia Transfer of Care Note  Patient: [name]  Procedure(s) Performed: COLONOSCOPY WITH PROPOFOL (N/A ) POLYPECTOMY  Patient Location: PACU  Anesthesia Type:MAC  Level of Consciousness: awake and alert   Airway & Oxygen Therapy: Patient Spontanous Breathing  Post-op Assessment: Report given to RN  Post vital signs: Reviewed and stable  Last Vitals:  Vitals:   10/04/17 1416 10/04/17 1501  BP:  (!) (P) 109/49  Pulse:  (P) 76  Resp: (!) 22 (!) (P) 21  Temp:  (!) (P) 36.4 C  SpO2: 100% (P) 96%    Last Pain:  Vitals:   10/04/17 1501  TempSrc:   PainSc: (P) 0-No pain      Patients Stated Pain Goal: 8 (12/22/70 5366)  Complications: No apparent anesthesia complications

## 2017-10-04 NOTE — Op Note (Signed)
[facility] Patient Name: [name] Procedure Date: 10/04/2017 2:17 PM MRN: [id number] Date of Birth: 03-12-1956 Attending MD: Hildred Laser , MD CSN: [id number] Age: 61 Admit Type: Outpatient Procedure:                Colonoscopy Indications:              Follow-up of diverticulitis Providers:                Hildred Laser, MD, Selena Lesser, Lurline Del, RN Referring MD:             Lemmie Evens, MD Medicines:                Propofol per Anesthesia Complications:            No immediate complications. Estimated Blood Loss:     Estimated blood loss was minimal. Procedure:                Pre-Anesthesia Assessment:                           - Prior to the procedure, a History and Physical                            was performed, and patient medications and                            allergies were reviewed. The patient's tolerance of                            previous anesthesia was also reviewed. The risks                            and benefits of the procedure and the sedation                            options and risks were discussed with the patient.                            All questions were answered, and informed consent                            was obtained. Prior Anticoagulants: The patient has                            taken no previous anticoagulant or antiplatelet                            agents. ASA Grade Assessment: II - A patient with                            mild systemic disease. After reviewing the risks                            and benefits, the patient was deemed in  satisfactory condition to undergo the procedure.                           After obtaining informed consent, the colonoscope                            was passed under direct vision. Throughout the                            procedure, the patient's blood pressure, pulse, and                            oxygen saturations were monitored continuously.  The                            [id number] ([id number]) scope was introduced through                            the and advanced to the the terminal ileum, with                            identification of the appendiceal orifice and IC                            valve. The colonoscopy was performed without                            difficulty. The patient tolerated the procedure                            well. The quality of the bowel preparation was                            excellent. The terminal ileum, ileocecal valve,                            appendiceal orifice, and rectum were photographed. Scope In: 2:35:33 PM Scope Out: 2:54:26 PM Scope Withdrawal Time: 0 hours 12 minutes 58 seconds  Total Procedure Duration: 0 hours 18 minutes 53 seconds  Findings:      The perianal and digital rectal examinations were normal.      There was a small lipoma, at the hepatic flexure.      Scattered small and large-mouthed diverticula were found in the sigmoid       colon and ascending colon.      A 4 mm polyp was found in the sigmoid colon. The polyp was sessile. The       polyp was removed with a cold snare. Resection and retrieval were       complete.      External hemorrhoids were found during retroflexion. The hemorrhoids       were medium-sized. 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                           -  Diverticulosis in the sigmoid colon and in the                            ascending colon.                           - One 4 mm polyp in the sigmoid colon, removed with                            a cold snare. Resected and retrieved.                           - External hemorrhoids. Moderate Sedation:      Per Anesthesia Care Recommendation:           - Patient has a contact number available for                            emergencies. The signs and symptoms of potential                            delayed complications were discussed with the                             patient. Return to normal activities tomorrow.                            Written discharge instructions were provided to the                            patient.                           - High fiber diet today.                           - Continue present medications.                           - Await pathology results.                           - Repeat colonoscopy date to be determined after                            pending pathology results are reviewed. Procedure Code(s):        --- Professional ---                           [phone], Colonoscopy, flexible; with removal of                            tumor(s), polyp(s), or other lesion(s) by snare                            technique Diagnosis Code(s):        ---  Professional ---                           D17.5, Benign lipomatous neoplasm of                            intra-abdominal organs                           K64.4, Residual hemorrhoidal skin tags                           D12.5, Benign neoplasm of sigmoid colon                           K57.32, Diverticulitis of large intestine without                            perforation or abscess without bleeding                           K57.30, Diverticulosis of large intestine without                            perforation or abscess without bleeding CPT copyright 2016 American Medical Association. All rights reserved. The codes documented in this report are preliminary and upon coder review may  be revised to meet current compliance requirements. Hildred Laser, MD Hildred Laser, MD 10/04/2017 3:11:31 PM This report has been signed electronically. Number of Addenda: 0

## 2017-10-04 NOTE — Discharge Instructions (Signed)
High-Fiber Diet Fiber, also called dietary fiber, is a type of carbohydrate found in fruits, vegetables, whole grains, and beans. A high-fiber diet can have many health benefits. Your health care provider may recommend a high-fiber diet to help:  Prevent constipation. Fiber can make your bowel movements more regular.  Lower your cholesterol.  Relieve hemorrhoids, uncomplicated diverticulosis, or irritable bowel syndrome.  Prevent overeating as part of a weight-loss plan.  Prevent heart disease, type 2 diabetes, and certain cancers.  What is my plan? The recommended daily intake of fiber includes:  38 grams for men under age 67.  31 grams for men over age 71.  61 grams for women under age 60.  32 grams for women over age 13.  You can get the recommended daily intake of dietary fiber by eating a variety of fruits, vegetables, grains, and beans. Your health care provider may also recommend a fiber supplement if it is not possible to get enough fiber through your diet. What do I need to know about a high-fiber diet?  Fiber supplements have not been widely studied for their effectiveness, so it is better to get fiber through food sources.  Always check the fiber content on thenutrition facts label of any prepackaged food. Look for foods that contain at least 5 grams of fiber per serving.  Ask your dietitian if you have questions about specific foods that are related to your condition, especially if those foods are not listed in the following section.  Increase your daily fiber consumption gradually. Increasing your intake of dietary fiber too quickly may cause bloating, cramping, or gas.  Drink plenty of water. Water helps you to digest fiber. What foods can I eat? Grains Whole-grain breads. Multigrain cereal. Oats and oatmeal. Brown rice. Barley. Bulgur wheat. [place]. Bran muffins. Popcorn. Rye wafer crackers. Vegetables Sweet potatoes. Spinach. Kale. Artichokes. Cabbage.  Broccoli. Green peas. Carrots. Squash. Fruits Berries. Pears. Apples. Oranges. Avocados. Prunes and raisins. Dried figs. Meats and Other Protein Sources [place], kidney, pinto, and soy beans. Split peas. Lentils. Nuts and seeds. Dairy Fiber-fortified yogurt. Beverages Fiber-fortified soy milk. Fiber-fortified orange juice. Other Fiber bars. The items listed above may not be a complete list of recommended foods or beverages. Contact your dietitian for more options. What foods are not recommended? Grains White bread. Pasta made with refined flour. White rice. Vegetables Fried potatoes. Canned vegetables. Well-cooked vegetables. Fruits Fruit juice. Cooked, strained fruit. Meats and Other Protein Sources Fatty cuts of meat. Fried Sales executive or fried fish. Dairy Milk. Yogurt. Cream cheese. Sour cream. Beverages Soft drinks. Other Cakes and pastries. Butter and oils. The items listed above may not be a complete list of foods and beverages to avoid. Contact your dietitian for more information. What are some tips for including high-fiber foods in my diet?  Eat a wide variety of high-fiber foods.  Make sure that half of all grains consumed each day are whole grains.  Replace breads and cereals made from refined flour or white flour with whole-grain breads and cereals.  Replace white rice with brown rice, bulgur wheat, or millet.  Start the day with a breakfast that is high in fiber, such as a cereal that contains at least 5 grams of fiber per serving.  Use beans in place of meat in soups, salads, or pasta.  Eat high-fiber snacks, such as berries, raw vegetables, nuts, or popcorn. This information is not intended to replace advice given to you by your health care provider. Make sure you discuss any  questions you have with your health care provider. Document Released: 12/07/2005 Document Revised: 05/14/2016 Document Reviewed: 05/22/2014 Elsevier Interactive Patient Education  2017  [place]. Colonoscopy, Adult, Care After This sheet gives you information about how to care for yourself after your procedure. Your health care provider may also give you more specific instructions. If you have problems or questions, contact your health care provider. What can I expect after the procedure? After the procedure, it is common to have:  A small amount of blood in your stool for 24 hours after the procedure.  Some gas.  Mild abdominal cramping or bloating.  Follow these instructions at home: General instructions   For the first 24 hours after the procedure: ? Do not drive or use machinery. ? Do not sign important documents. ? Do not drink alcohol. ? Do your regular daily activities at a slower pace than normal. ? Eat soft, easy-to-digest foods. ? Rest often.  Take over-the-counter or prescription medicines only as told by your health care provider.  It is up to you to get the results of your procedure. Ask your health care provider, or the department performing the procedure, when your results will be ready. Relieving cramping and bloating  Try walking around when you have cramps or feel bloated.  Apply heat to your abdomen as told by your health care provider. Use a heat source that your health care provider recommends, such as a moist heat pack or a heating pad. ? Place a towel between your skin and the heat source. ? Leave the heat on for 20-30 minutes. ? Remove the heat if your skin turns bright red. This is especially important if you are unable to feel pain, heat, or cold. You may have a greater risk of getting burned. Eating and drinking  Drink enough fluid to keep your urine clear or pale yellow.  Resume your normal diet as instructed by your health care provider. Avoid heavy or fried foods that are hard to digest.  Avoid drinking alcohol for as long as instructed by your health care provider. Contact a health care provider if:  You have blood in  your stool 2-3 days after the procedure. Get help right away if:  You have more than a small spotting of blood in your stool.  You pass large blood clots in your stool.  Your abdomen is swollen.  You have nausea or vomiting.  You have a fever.  You have increasing abdominal pain that is not relieved with medicine. This information is not intended to replace advice given to you by your health care provider. Make sure you discuss any questions you have with your health care provider. Document Released: 07/21/2004 Document Revised: 08/31/2016 Document Reviewed: 02/18/2016 Elsevier Interactive Patient Education  2018 [place] usual medications as before. High fiber diet. Benefiber 4 g by mouth daily at bedtime. No driving for 24 hours. Physician will call with biopsy results.

## 2017-10-06 ENCOUNTER — Encounter (HOSPITAL_COMMUNITY): Payer: Self-pay | Admitting: Internal Medicine

## 2017-10-07 ENCOUNTER — Encounter (HOSPITAL_COMMUNITY): Payer: Self-pay | Admitting: Emergency Medicine

## 2017-10-07 ENCOUNTER — Emergency Department (HOSPITAL_COMMUNITY): Payer: Self-pay

## 2017-10-07 ENCOUNTER — Emergency Department (HOSPITAL_COMMUNITY)
Admission: EM | Admit: 2017-10-07 | Discharge: 2017-10-07 | Disposition: A | Discharge status: Home / Self Care | Payer: Self-pay | Attending: Emergency Medicine | Admitting: Emergency Medicine

## 2017-10-07 DIAGNOSIS — I10 Essential (primary) hypertension: Secondary | ICD-10-CM | POA: Insufficient documentation

## 2017-10-07 DIAGNOSIS — J45909 Unspecified asthma, uncomplicated: Secondary | ICD-10-CM | POA: Insufficient documentation

## 2017-10-07 DIAGNOSIS — X500XXA Overexertion from strenuous movement or load, initial encounter: Secondary | ICD-10-CM | POA: Insufficient documentation

## 2017-10-07 DIAGNOSIS — S8392XA Sprain of unspecified site of left knee, initial encounter: Secondary | ICD-10-CM | POA: Insufficient documentation

## 2017-10-07 DIAGNOSIS — Y990 Civilian activity done for income or pay: Secondary | ICD-10-CM | POA: Insufficient documentation

## 2017-10-07 DIAGNOSIS — Y9389 Activity, other specified: Secondary | ICD-10-CM | POA: Insufficient documentation

## 2017-10-07 DIAGNOSIS — Z91040 Latex allergy status: Secondary | ICD-10-CM | POA: Insufficient documentation

## 2017-10-07 DIAGNOSIS — Z79899 Other long term (current) drug therapy: Secondary | ICD-10-CM | POA: Insufficient documentation

## 2017-10-07 DIAGNOSIS — Y9289 Other specified places as the place of occurrence of the external cause: Secondary | ICD-10-CM | POA: Insufficient documentation

## 2017-10-07 MED ORDER — ACETAMINOPHEN 500 MG PO TABS
1000.0000 mg | ORAL_TABLET | Freq: Once | ORAL | Status: DC
  Filled 2017-10-07: qty 2

## 2017-10-07 NOTE — Discharge Instructions (Signed)
You can take Tylenol or Ibuprofen as directed for pain. You can alternate Tylenol and Ibuprofen every 4 hours. If you take Tylenol at 1pm, then you can take Ibuprofen at 5pm. Then you can take Tylenol again at 9pm.   Follow the RICE (Rest, Ice, Compression, Elevation) protocol as directed.   Follow-up with referred orthopedic doctor for further evaluation.   Return the emergency Department for any worsening pain, fever, chest pain, difficulty breathing, redness/swelling that extends down to the lower leg, numbness or weakness of the legs or any other worsening or concerning symptoms.

## 2017-10-07 NOTE — ED Triage Notes (Signed)
While at work yesterday, knee popped, causing pain and swelling, used ice pack, today some swelling and but painful.

## 2017-10-07 NOTE — ED Provider Notes (Signed)
[facility] EMERGENCY DEPARTMENT Provider Note   CSN: [id number] Arrival date & time: 10/07/17  1115     History   Chief Complaint Chief Complaint  Patient presents with  . Knee Pain    HPI [name] is a 61 y.o. female Who presents with left knee pain that began yesterday. Patient states that she was at work yesterday, walking when she moved wrong causing the knee to turn. Patient reports that she heard a popping sound at that time. Patient reports that since then, she is experienced pain and swelling to the left knee. Patient states that she has been using some ice but notes minimal improvement. She has not taken any medications for the pain. Patient states that she's been able to bear some weight on the leg since this incident occurred but reports her pain is worse with ambulation. She also reports that pain is worsened with movement of the leg. He denies any trauma, fall, injury.She denies any fevers, redness/swelling of the distal leg, ankle pain.  The history is provided by the patient.    Past Medical History:  Diagnosis Date  . Anxiety   . Asthma   . Diverticulitis   . Headache   . Hypertension   . Seasonal allergies   . Vertigo     Patient Active Problem List   Diagnosis Date Noted  . Diverticulitis of colon 09/15/2017  . Diverticulitis 05/24/2017  . AKI (acute kidney injury) ([place]) 05/24/2017  . Hypokalemia 05/24/2017  . Essential hypertension 05/24/2017  . Vertigo 05/24/2017  . CAP (community acquired pneumonia) 05/04/2015  . Sepsis ([place]) 05/02/2015  . Acute respiratory failure ([place]) 05/02/2015    Past Surgical History:  Procedure Laterality Date  . ABDOMINAL HYSTERECTOMY    . ABLATION COLPOCLESIS    . CHOLECYSTECTOMY    . COLONOSCOPY WITH PROPOFOL N/A 10/04/2017   Procedure: COLONOSCOPY WITH PROPOFOL;  Surgeon: Rogene Houston, MD;  [facility];  Service: Endoscopy;  Laterality: N/A;  1:15  . POLYPECTOMY  10/04/2017   Procedure:  POLYPECTOMY;  Surgeon: Rogene Houston, MD;  Location: [facility];  Service: Endoscopy;;  sigmoid colon polyp    OB History    Gravida Para Term Preterm AB Living   3 2 2   1      SAB TAB Ectopic Multiple Live Births   1               Home Medications    Prior to Admission medications   Medication Sig Start Date End Date Taking? Authorizing Provider  albuterol (PROVENTIL HFA;VENTOLIN HFA) 108 (90 Base) MCG/ACT inhaler Inhale 2 puffs into the lungs every 6 (six) hours as needed for wheezing or shortness of breath.     [provider]  ALPRAZolam Duanne Moron) 1 MG tablet Take 2 mg by mouth at bedtime.    [provider]  ciprofloxacin (CIPRO) 500 MG tablet Take 500 mg by mouth 2 (two) times daily as needed (diverticulitis flare ups).    [provider]  HYDROcodone-acetaminophen (NORCO/VICODIN) 5-325 MG tablet Take 1-2 tablets by mouth every 4 (four) hours as needed for moderate pain or severe pain. 05/25/17   Kathie Dike, MD  meclizine (ANTIVERT) 12.5 MG tablet Take 1 tablet (12.5 mg total) by mouth 3 (three) times daily as needed for dizziness. 09/15/16   Isla Pence, MD  metroNIDAZOLE (FLAGYL) 500 MG tablet Take 500 mg by mouth 2 (two) times daily as needed (for diverticulitis flare ups).  [provider]  Multiple Vitamin (MULTIVITAMIN WITH MINERALS) TABS tablet Take 1 tablet by mouth daily.    [provider]  triamcinolone cream (KENALOG) 0.1 % Apply 1 application topically 2 (two) times daily. Apply sparingly to neck rash for up to 10 days. 09/23/17   Evalee Jefferson, PA-C  triamterene-hydrochlorothiazide (MAXZIDE) 75-50 MG tablet Take 0.5 tablets by mouth daily. Resume in 1 week Patient taking differently: Take 0.5 tablets by mouth daily.  05/25/17   Kathie Dike, MD  Wheat Dextrin (BENEFIBER DRINK MIX) PACK Take 4 g by mouth at bedtime. 10/04/17   Rogene Houston, MD    Family History Family History  Problem Relation Age of Onset    . Emphysema Mother   . Stroke Father   . Hypertension Father   . Diabetes Sister   . Hypertension Sister   . Stroke Brother   . Hypertension Brother     Social History Social History  Substance Use Topics  . Smoking status: Never Smoker  . Smokeless tobacco: Never Used  . Alcohol use No     Allergies   Iohexol; Latex; Benadryl [diphenhydramine]; and Sulfa antibiotics   Review of Systems Review of Systems  Constitutional: Negative for fever.  Musculoskeletal:       Left knee pain  Skin: Negative for color change.  Neurological: Negative for weakness and numbness.     Physical Exam Updated Vital Signs BP (!) 144/72 (BP Location: Left Arm)   Pulse 76   Temp 97.7 F (36.5 C) (Temporal)   Resp 20   Ht 4\' 11"  (1.499 m)   Wt 78.5 kg (173 lb)   SpO2 100%   BMI 34.94 kg/m   Physical Exam  Constitutional: She appears well-developed and well-nourished.  Sitting comfortably on examination table  HENT:  Head: Normocephalic and atraumatic.  Eyes: Conjunctivae and EOM are normal. Right eye exhibits no discharge. Left eye exhibits no discharge. No scleral icterus.  Pulmonary/Chest: Effort normal.  Musculoskeletal:  Tenderness palpation to the anterior aspect of the left knee with mild overlying soft tissue swelling. No overlying warmth, erythema, ecchymosis. No deformity or crepitus noted. Limited flexion secondary to patient's pain. Extension intact. Negative posterior/anterior drawer test. Mild instability noted on varus stress. No abnormalities of the right knee. No distal extremity edema, warmth, swelling.  Neurological: She is alert.  Skin: Skin is warm and dry.  Psychiatric: She has a normal mood and affect. Her speech is normal and behavior is normal.  Nursing note and vitals reviewed.    ED Treatments / Results  Labs (all labs ordered are listed, but only abnormal results are displayed) Labs Reviewed - No data to display  EKG  EKG Interpretation None        Radiology Dg Knee Complete 4 Views Left  Result Date: 10/07/2017 CLINICAL DATA:  Left knee pain and swelling. EXAM: LEFT KNEE - COMPLETE 4+ VIEW COMPARISON:  10/04/2014 FINDINGS: Tricompartment degenerative change. Loose bodies noted. No evidence of fracture or dislocation. No acute bony abnormality. IMPRESSION: Tricompartment degenerative change. Loose bodies noted. No acute bony abnormality. Electronically Signed   By: Marcello Moores  Register   On: 10/07/2017 11:52    Procedures Procedures (including critical care time)  Medications Ordered in ED Medications - No data to display   Initial Impression / Assessment and Plan / ED Course  I have reviewed the triage vital signs and the nursing notes.  Pertinent labs & imaging results that were available during my care of the patient  were reviewed by me and considered in my medical decision making (see chart for details).     61 y.o. F who presents with left knee pain that began yesterday. Patient was walking and turn any other way causing it to pop. Patient is afebrile, non-toxic appearing, sitting comfortably on examination table. Vital signs reviewed and stable. Consider knee sprain versus fracture versus dislocation. History/physical exam are not concerning for septic arthritis or DVT. Initial x-rays ordered at triage. Analgesics provided in the department.   XRs reviewed. Negative for any acute fracture or dislocation. There is degenerative changes noted. Will treat as knee sprain. She pretty knee immobilizer and crutches in the department. Will plan to provide outpatient orthopedic referral for further evaluation. Short course of pain medication given for symptomatically relief. Strict return precautions discussed. Patient expresses understanding and agreement to plan.    Final Clinical Impressions(s) / ED Diagnoses   Final diagnoses:  Sprain of left knee, unspecified ligament, initial encounter    New Prescriptions Discharge  Medication List as of 10/07/2017  1:00 PM       Volanda Napoleon, PA-C 10/09/17 2214    Milton Ferguson, MD 10/11/17 [phone]

## 2017-11-16 ENCOUNTER — Emergency Department (HOSPITAL_COMMUNITY)
Admission: EM | Admit: 2017-11-16 | Discharge: 2017-11-16 | Disposition: A | Discharge status: Home / Self Care | Payer: Self-pay | Attending: Emergency Medicine | Admitting: Emergency Medicine

## 2017-11-16 ENCOUNTER — Other Ambulatory Visit: Payer: Self-pay

## 2017-11-16 ENCOUNTER — Emergency Department (HOSPITAL_COMMUNITY): Payer: Self-pay

## 2017-11-16 ENCOUNTER — Encounter (HOSPITAL_COMMUNITY): Payer: Self-pay | Admitting: Emergency Medicine

## 2017-11-16 DIAGNOSIS — M79672 Pain in left foot: Secondary | ICD-10-CM | POA: Insufficient documentation

## 2017-11-16 DIAGNOSIS — Z91040 Latex allergy status: Secondary | ICD-10-CM | POA: Insufficient documentation

## 2017-11-16 DIAGNOSIS — I10 Essential (primary) hypertension: Secondary | ICD-10-CM | POA: Insufficient documentation

## 2017-11-16 DIAGNOSIS — J45909 Unspecified asthma, uncomplicated: Secondary | ICD-10-CM | POA: Insufficient documentation

## 2017-11-16 DIAGNOSIS — Z79899 Other long term (current) drug therapy: Secondary | ICD-10-CM | POA: Insufficient documentation

## 2017-11-16 DIAGNOSIS — M1712 Unilateral primary osteoarthritis, left knee: Secondary | ICD-10-CM | POA: Insufficient documentation

## 2017-11-16 HISTORY — DX: Pain in unspecified knee: M25.569

## 2017-11-16 HISTORY — DX: Other chronic pain: G89.29

## 2017-11-16 MED ORDER — HYDROCODONE-ACETAMINOPHEN 5-325 MG PO TABS: 1.0000 | ORAL_TABLET | Freq: Four times a day (QID) | ORAL | 0 refills | Status: DC | PRN

## 2017-11-16 MED ORDER — IBUPROFEN 400 MG PO TABS: 400.0000 mg | ORAL_TABLET | Freq: Four times a day (QID) | ORAL | 0 refills | Status: DC

## 2017-11-16 MED ORDER — DEXAMETHASONE SODIUM PHOSPHATE 10 MG/ML IJ SOLN
10.0000 mg | Freq: Once | INTRAMUSCULAR | Status: AC
  Administered 2017-11-16: 10 mg via INTRAMUSCULAR
  Filled 2017-11-16: qty 1

## 2017-11-16 MED ORDER — DEXAMETHASONE 4 MG PO TABS: 4.0000 mg | ORAL_TABLET | Freq: Two times a day (BID) | ORAL | 0 refills | Status: DC

## 2017-11-16 MED ORDER — HYDROCODONE-ACETAMINOPHEN 5-325 MG PO TABS: 1.0000 | ORAL_TABLET | Freq: Once | ORAL | Status: DC

## 2017-11-16 MED ORDER — KETOROLAC TROMETHAMINE 60 MG/2ML IM SOLN
60.0000 mg | Freq: Once | INTRAMUSCULAR | Status: AC
Start: 2017-11-16 — End: 2017-11-16
  Administered 2017-11-16: 60 mg via INTRAMUSCULAR
  Filled 2017-11-16: qty 2

## 2017-11-16 MED ORDER — ONDANSETRON HCL 4 MG PO TABS
4.0000 mg | ORAL_TABLET | Freq: Once | ORAL | Status: AC
  Administered 2017-11-16: 4 mg via ORAL
  Filled 2017-11-16: qty 1

## 2017-11-16 NOTE — Clinical Social Work Note (Signed)
LCSW received a call in regards to patient. LCSW was placed on hold to speak with PA Marshell Levan. Phone hung up. LCSW attempted to contact PA Marshell Levan in regards to patient and received no answer.      Brieann Osinski, Clydene Pugh, LCSW

## 2017-11-16 NOTE — ED Provider Notes (Signed)
[facility] EMERGENCY DEPARTMENT Provider Note   CSN: [id number] Arrival date & time: 11/16/17  1211     History   Chief Complaint Chief Complaint  Patient presents with  . Knee Pain    HPI [name] is a 61 y.o. female.  Patient is a 61 year old female who presents to the emergency department with a complaint of left knee pain and right foot pain. Patient states she has been having problems with her left knee for quite some time.  She says at times she has problems with both knees, but the left one swells more than the right.  She states she has mentioned this to her primary physician, but that they have not given it much attention up to this point.  The patient complains of pain involving the knee especially when she walks or when she stands for any given period of time.  The patient also states that at times the pain radiates from the knee down to the foot.  Patient complains of a knot on top of her right foot.  She states that she wears steel toe boots and sometimes this causes pain and discomfort of her foot.  She has not had any loss of use or function of the right or left lower extremity.  She has not had any direct injury that she recalls.  There is been no recent operations or procedures.      Past Medical History:  Diagnosis Date  . Anxiety   . Asthma   . Chronic knee pain   . Diverticulitis   . Headache   . Hypertension   . Seasonal allergies   . Vertigo     Patient Active Problem List   Diagnosis Date Noted  . Diverticulitis of colon 09/15/2017  . Diverticulitis 05/24/2017  . AKI (acute kidney injury) ([place]) 05/24/2017  . Hypokalemia 05/24/2017  . Essential hypertension 05/24/2017  . Vertigo 05/24/2017  . CAP (community acquired pneumonia) 05/04/2015  . Sepsis ([place]) 05/02/2015  . Acute respiratory failure ([place]) 05/02/2015    Past Surgical History:  Procedure Laterality Date  . ABDOMINAL HYSTERECTOMY    . ABLATION COLPOCLESIS    .  CHOLECYSTECTOMY    . COLONOSCOPY WITH PROPOFOL N/A 10/04/2017   Procedure: COLONOSCOPY WITH PROPOFOL;  Surgeon: Rogene Houston, MD;  [facility];  Service: Endoscopy;  Laterality: N/A;  1:15  . POLYPECTOMY  10/04/2017   Procedure: POLYPECTOMY;  Surgeon: Rogene Houston, MD;  [facility];  Service: Endoscopy;;  sigmoid colon polyp    OB History    Gravida Para Term Preterm AB Living   3 2 2   1      SAB TAB Ectopic Multiple Live Births   1               Home Medications    Prior to Admission medications   Medication Sig Start Date End Date Taking? Authorizing Provider  albuterol (PROVENTIL HFA;VENTOLIN HFA) 108 (90 Base) MCG/ACT inhaler Inhale 2 puffs into the lungs every 6 (six) hours as needed for wheezing or shortness of breath.     [provider]  ALPRAZolam Duanne Moron) 1 MG tablet Take 2 mg by mouth at bedtime.    [provider]  ciprofloxacin (CIPRO) 500 MG tablet Take 500 mg by mouth 2 (two) times daily as needed (diverticulitis flare ups).    [provider]  HYDROcodone-acetaminophen (NORCO/VICODIN) 5-325 MG tablet Take 1-2 tablets by mouth every 4 (four) hours as  needed for moderate pain or severe pain. 05/25/17   Kathie Dike, MD  meclizine (ANTIVERT) 12.5 MG tablet Take 1 tablet (12.5 mg total) by mouth 3 (three) times daily as needed for dizziness. 09/15/16   Isla Pence, MD  metroNIDAZOLE (FLAGYL) 500 MG tablet Take 500 mg by mouth 2 (two) times daily as needed (for diverticulitis flare ups).    [provider]  Multiple Vitamin (MULTIVITAMIN WITH MINERALS) TABS tablet Take 1 tablet by mouth daily.    [provider]  triamcinolone cream (KENALOG) 0.1 % Apply 1 application topically 2 (two) times daily. Apply sparingly to neck rash for up to 10 days. 09/23/17   Evalee Jefferson, PA-C  triamterene-hydrochlorothiazide (MAXZIDE) 75-50 MG tablet Take 0.5 tablets by mouth daily. Resume in 1 week Patient taking  differently: Take 0.5 tablets by mouth daily.  05/25/17   Kathie Dike, MD  Wheat Dextrin (BENEFIBER DRINK MIX) PACK Take 4 g by mouth at bedtime. 10/04/17   Rogene Houston, MD    Family History Family History  Problem Relation Age of Onset  . Emphysema Mother   . Stroke Father   . Hypertension Father   . Diabetes Sister   . Hypertension Sister   . Stroke Brother   . Hypertension Brother     Social History Social History   Tobacco Use  . Smoking status: Never Smoker  . Smokeless tobacco: Never Used  Substance Use Topics  . Alcohol use: No  . Drug use: No     Allergies   Iohexol; Latex; Benadryl [diphenhydramine]; and Sulfa antibiotics   Review of Systems Review of Systems  Constitutional: Negative for activity change.       All ROS Neg except as noted in HPI  HENT: Negative for nosebleeds.   Eyes: Negative for photophobia and discharge.  Respiratory: Negative for cough, shortness of breath and wheezing.   Cardiovascular: Negative for chest pain and palpitations.  Gastrointestinal: Negative for abdominal pain and blood in stool.  Genitourinary: Negative for dysuria, frequency and hematuria.  Musculoskeletal: Positive for arthralgias and gait problem. Negative for back pain and neck pain.  Skin: Negative.   Neurological: Negative for dizziness, seizures and speech difficulty.  Psychiatric/Behavioral: Negative for confusion and hallucinations.     Physical Exam Updated Vital Signs BP (!) 127/94   Pulse 80   Temp 98.3 F (36.8 C) (Oral)   Resp 17   SpO2 100%   Physical Exam  Constitutional: She is oriented to person, place, and time. She appears well-developed and well-nourished.  Non-toxic appearance.  HENT:  Head: Normocephalic.  Right Ear: Tympanic membrane and external ear normal.  Left Ear: Tympanic membrane and external ear normal.  Eyes: EOM and lids are normal. Pupils are equal, round, and reactive to light.  Neck: Normal range of motion. Neck  supple. Carotid bruit is not present.  Cardiovascular: Normal rate, regular rhythm, normal heart sounds, intact distal pulses and normal pulses.  Pulmonary/Chest: Breath sounds normal. No respiratory distress.  Abdominal: Soft. Bowel sounds are normal. There is no tenderness. There is no guarding.  Musculoskeletal: Normal range of motion. She exhibits tenderness.  Patient has what appears to be a bony cyst versus a mass of the dorsum of the right foot.  She has full range of motion of the toes of the right foot.  There is full range of motion of the ankle of the right foot.  The Achilles tendon is intact.  There is crepitus with flexion and extension of  the right knee.  There is pain with flexion and extension of the left knee.  There is what appears to be mild effusion present.  There is crepitus noted.  There is no mass or swelling of the posterior portion of the knee.  There is full range of motion of the left ankle and toes.  The Achilles tendon is intact.  Dorsalis pedis is 2+ bilaterally.  Lymphadenopathy:       Head (right side): No submandibular adenopathy present.       Head (left side): No submandibular adenopathy present.    She has no cervical adenopathy.  Neurological: She is alert and oriented to person, place, and time. She has normal strength. No cranial nerve deficit or sensory deficit.  Skin: Skin is warm and dry.  Psychiatric: She has a normal mood and affect. Her speech is normal.  Nursing note and vitals reviewed.    ED Treatments / Results  Labs (all labs ordered are listed, but only abnormal results are displayed) Labs Reviewed - No data to display  EKG  EKG Interpretation None       Radiology No results found.  Procedures Procedures (including critical care time)  Medications Ordered in ED Medications - No data to display   Initial Impression / Assessment and Plan / ED Course  I have reviewed the triage vital signs and the nursing notes.  Pertinent  labs & imaging results that were available during my care of the patient were reviewed by me and considered in my medical decision making (see chart for details).       Final Clinical Impressions(s) / ED Diagnoses MDM Blood pressure slightly elevated at 127/94.  The patient has pain mild swelling and questionable mild effusion of the left knee.  The knee is warm, but not hot.  There is a raised area on the dorsum of the left foot.  X-ray of the left foot shows degenerative changes in the midfoot at the talonavicular joint.  There is a small osteophyte present and it is felt that this may be what the patient is feeling or palpating on her foot.  There is no fracture or dislocation appreciated.  I discussed with the patient that she will need an orthopedic evaluation concerning her knee.  The patient states that she does not have insurance.  She says she has something through [facility] that helps her with her visits but other than that she has no form of insurance.  I have asked the medical social worker to come down and talk with the patient.  Patient will be treated with nonsteroidal anti-inflammatory medication, short course of steroid for her knee and foot.  Patient will be referred to orthopedics for additional evaluation and management.   Final diagnoses:  Primary osteoarthritis of left knee  Left foot pain    ED Discharge Orders        Ordered    dexamethasone (DECADRON) 4 MG tablet  2 times daily with meals     11/16/17 1431    ibuprofen (ADVIL,MOTRIN) 400 MG tablet  4 times daily     11/16/17 1431    HYDROcodone-acetaminophen (NORCO/VICODIN) 5-325 MG tablet  Every 6 hours PRN     11/16/17 1431       Lily Kocher, PA-C 11/16/17 1431    Milton Ferguson, MD 11/16/17 [phone]

## 2017-11-16 NOTE — ED Notes (Signed)
Pt waiting on SW

## 2017-11-16 NOTE — Clinical Social Work Note (Signed)
Patient was consulted for social work because she has no insurance and thought she had a discount account with [facility]. LCSW advised nursing secretary that this would likely be a function of the financial counselor to explore at [phone]. LCSW advised that possibly case management could involve patient with additional medical resources.   Patient has no CSW needs.   LCSW signing off.    Janine Reller, Clydene Pugh, LCSW

## 2017-11-16 NOTE — Discharge Instructions (Signed)
The pain and swelling in your knee is most likely related to degenerative changes.  Please call Dr. Aline Brochure, or the orthopedist suggested by the medical social services personnel as soon as possible for evaluation.  The x-ray of your foot is negative for any fracture or dislocation.  There does appear to be a cyst versus degenerative changes of your foot.  Please use 400 mg of ibuprofen with breakfast, lunch, dinner, and at bedtime.  Please use Decadron 2 times daily with food.

## 2017-11-16 NOTE — ED Notes (Signed)
Tired of waiting on social worker, decided to leave

## 2017-11-16 NOTE — ED Notes (Signed)
Called heather from social work and transferred to [employer] at [employer]

## 2017-11-16 NOTE — ED Triage Notes (Signed)
Pt c/o chronic left knee pain. States pain is radiating down left leg into foot now. Nad. Mild limp noted with walking. Takes oxycodone with some relief. Mild swellling noted.

## 2017-11-16 NOTE — ED Notes (Signed)
Pt taken to xray 

## 2017-11-29 ENCOUNTER — Ambulatory Visit: Payer: Self-pay | Admitting: Orthopedic Surgery

## 2017-12-07 ENCOUNTER — Encounter: Payer: Self-pay | Admitting: Orthopedic Surgery

## 2017-12-07 ENCOUNTER — Ambulatory Visit (INDEPENDENT_AMBULATORY_CARE_PROVIDER_SITE_OTHER): Payer: Self-pay | Admitting: Orthopedic Surgery

## 2017-12-07 VITALS — BP 130/81 | HR 81 | Ht 59.0 in | Wt 172.0 lb

## 2017-12-07 DIAGNOSIS — M25562 Pain in left knee: Secondary | ICD-10-CM

## 2017-12-07 DIAGNOSIS — M2342 Loose body in knee, left knee: Secondary | ICD-10-CM

## 2017-12-07 DIAGNOSIS — G8929 Other chronic pain: Secondary | ICD-10-CM

## 2017-12-07 DIAGNOSIS — M1712 Unilateral primary osteoarthritis, left knee: Secondary | ICD-10-CM

## 2017-12-07 DIAGNOSIS — M23322 Other meniscus derangements, posterior horn of medial meniscus, left knee: Secondary | ICD-10-CM

## 2017-12-07 NOTE — Progress Notes (Signed)
Patient ID: [name], female   DOB: [birth year], 61 y.o.   MRN: [id number]  Chief Complaint  Patient presents with  . Knee Pain    Left knee pain, xrays at [facility].    HPI [name] is a 61 y.o. female.  Presents for evaluation of left knee.  She says my left knee is no good  She complains of 5-year history of intermittent knee pain worse over the radiates from the knee down to the left ankle associated with lots of popping cracking and loss of motion.  Her pain is best described as medial dull moderate to severe constant worse with moving associated with swelling popping and catching  Review of Systems Review of Systems  Constitutional: Negative.   Neurological: Negative.     Past Medical History:  Diagnosis Date  . Anxiety   . Asthma   . Chronic knee pain   . Diverticulitis   . Headache   . Hypertension   . Seasonal allergies   . Vertigo     Past Surgical History:  Procedure Laterality Date  . ABDOMINAL HYSTERECTOMY    . ABLATION COLPOCLESIS    . CHOLECYSTECTOMY    . COLONOSCOPY WITH PROPOFOL N/A 10/04/2017   Procedure: COLONOSCOPY WITH PROPOFOL;  Surgeon: Rogene Houston, MD;  Location: [facility];  Service: Endoscopy;  Laterality: N/A;  1:15  . POLYPECTOMY  10/04/2017   Procedure: POLYPECTOMY;  Surgeon: Rogene Houston, MD;  Location: [facility];  Service: Endoscopy;;  sigmoid colon polyp    Family History  Problem Relation Age of Onset  . Emphysema Mother   . Stroke Father   . Hypertension Father   . Diabetes Sister   . Hypertension Sister   . Stroke Brother   . Hypertension Brother     Social History Social History   Tobacco Use  . Smoking status: Never Smoker  . Smokeless tobacco: Never Used  Substance Use Topics  . Alcohol use: No  . Drug use: No    Allergies  Allergen Reactions  . Iohexol Anaphylaxis       . Latex Other (See Comments)    Causes dark spots on skin, but not pain, or irritation.   . Benadryl  [Diphenhydramine] Rash    Liquid. Pt states she can take pills.  . Sulfa Antibiotics Rash    Minor Shaking    Current Outpatient Medications  Medication Sig Dispense Refill  . albuterol (PROVENTIL HFA;VENTOLIN HFA) 108 (90 Base) MCG/ACT inhaler Inhale 2 puffs into the lungs every 6 (six) hours as needed for wheezing or shortness of breath.     . ALPRAZolam (XANAX) 1 MG tablet Take 2 mg by mouth at bedtime.    . ciprofloxacin (CIPRO) 500 MG tablet Take 500 mg by mouth 2 (two) times daily as needed (diverticulitis flare ups).    Marland Kitchen dexamethasone (DECADRON) 4 MG tablet Take 1 tablet (4 mg total) by mouth 2 (two) times daily with a meal. 10 tablet 0  . HYDROcodone-acetaminophen (NORCO/VICODIN) 5-325 MG tablet Take 1 tablet by mouth every 6 (six) hours as needed. 12 tablet 0  . ibuprofen (ADVIL,MOTRIN) 400 MG tablet Take 1 tablet (400 mg total) by mouth 4 (four) times daily. 30 tablet 0  . meclizine (ANTIVERT) 12.5 MG tablet Take 1 tablet (12.5 mg total) by mouth 3 (three) times daily as needed for dizziness. 30 tablet 0  . metroNIDAZOLE (FLAGYL) 500 MG tablet Take 500 mg by mouth 2 (two) times  daily as needed (for diverticulitis flare ups).    . Multiple Vitamin (MULTIVITAMIN WITH MINERALS) TABS tablet Take 1 tablet by mouth daily.    Marland Kitchen triamcinolone cream (KENALOG) 0.1 % Apply 1 application topically 2 (two) times daily. Apply sparingly to neck rash for up to 10 days. 30 g 0  . triamterene-hydrochlorothiazide (MAXZIDE) 75-50 MG tablet Take 0.5 tablets by mouth daily. Resume in 1 week (Patient taking differently: Take 0.5 tablets by mouth daily. )    . Wheat Dextrin (BENEFIBER DRINK MIX) PACK Take 4 g by mouth at bedtime.     No current facility-administered medications for this visit.        Physical Exam Physical Exam  Musculoskeletal:       Legs:  Blood pressure 130/81, pulse 81, height 4\' 11"  (1.499 m), weight 172 lb (78 kg). Appearance, there are no abnormalities in terms of  appearance the patient was well-developed and well-nourished. The grooming and hygiene were normal.  Mental status orientation, there was normal alertness and orientation Mood pleasant Ambulatory status normal with no assistive devices    Data Reviewed Pain films show osteoarthritis with loose bodies left knee  Assessment  Use bodies left knee osteoarthritis left knee medial meniscal tear left knee recommend MRI left knee   3:35 PM Arther Abbott, MD 12/07/2017

## 2017-12-07 NOTE — Progress Notes (Signed)
Office visit today chaperoned by Acquanetta Belling.

## 2017-12-07 NOTE — Patient Instructions (Addendum)
Go for the MRI scan at [facility], on Dec 24th at 8am arrive at 7:30 you can take an Alprazolam before the scan one hour prior, make sure you have someone to drive you if you take the Alprazolam   Out of work until follow up

## 2017-12-13 ENCOUNTER — Ambulatory Visit (HOSPITAL_COMMUNITY)
Admission: RE | Admit: 2017-12-13 | Discharge: 2017-12-13 | Disposition: A | Discharge status: Home / Self Care | Payer: Self-pay | Source: Ambulatory Visit | Attending: Orthopedic Surgery | Admitting: Orthopedic Surgery

## 2017-12-13 ENCOUNTER — Encounter (HOSPITAL_COMMUNITY): Payer: Self-pay | Admitting: Radiology

## 2017-12-13 DIAGNOSIS — M2400 Loose body in unspecified joint: Secondary | ICD-10-CM | POA: Insufficient documentation

## 2017-12-13 DIAGNOSIS — G8929 Other chronic pain: Secondary | ICD-10-CM | POA: Insufficient documentation

## 2017-12-13 DIAGNOSIS — M1712 Unilateral primary osteoarthritis, left knee: Secondary | ICD-10-CM | POA: Insufficient documentation

## 2017-12-13 DIAGNOSIS — X58XXXA Exposure to other specified factors, initial encounter: Secondary | ICD-10-CM | POA: Insufficient documentation

## 2017-12-13 DIAGNOSIS — M25462 Effusion, left knee: Secondary | ICD-10-CM | POA: Insufficient documentation

## 2017-12-13 DIAGNOSIS — M948X6 Other specified disorders of cartilage, lower leg: Secondary | ICD-10-CM | POA: Insufficient documentation

## 2017-12-13 DIAGNOSIS — S83272A Complex tear of lateral meniscus, current injury, left knee, initial encounter: Secondary | ICD-10-CM | POA: Insufficient documentation

## 2017-12-13 DIAGNOSIS — M25562 Pain in left knee: Secondary | ICD-10-CM

## 2017-12-13 DIAGNOSIS — R600 Localized edema: Secondary | ICD-10-CM | POA: Insufficient documentation

## 2017-12-13 DIAGNOSIS — S83102A Unspecified subluxation of left knee, initial encounter: Secondary | ICD-10-CM | POA: Insufficient documentation

## 2017-12-13 DIAGNOSIS — M85662 Other cyst of bone, left lower leg: Secondary | ICD-10-CM | POA: Insufficient documentation

## 2017-12-22 ENCOUNTER — Encounter: Payer: Self-pay | Admitting: Orthopedic Surgery

## 2017-12-28 ENCOUNTER — Encounter: Payer: Self-pay | Admitting: Orthopedic Surgery

## 2017-12-28 ENCOUNTER — Ambulatory Visit (INDEPENDENT_AMBULATORY_CARE_PROVIDER_SITE_OTHER): Payer: Self-pay | Admitting: Orthopedic Surgery

## 2017-12-28 VITALS — BP 120/70 | HR 89 | Ht 59.0 in | Wt 175.0 lb

## 2017-12-28 DIAGNOSIS — M1712 Unilateral primary osteoarthritis, left knee: Secondary | ICD-10-CM

## 2017-12-28 DIAGNOSIS — M23301 Other meniscus derangements, unspecified lateral meniscus, left knee: Secondary | ICD-10-CM

## 2017-12-28 NOTE — Progress Notes (Signed)
Progress Note   Patient ID: [name], female   DOB: [birth year], 62 y.o.   MRN: [id number]  Chief Complaint  Patient presents with  . Knee Pain    bilateral  . Results    review MRI     62 year old female presents for evaluation of bilateral knee pain and review of MRI of the left knee  She complains of severe dull constant pain severe clicking and popping left knee now for several months associated with swelling and decreased range of motion     Review of Systems  Musculoskeletal: Positive for joint pain.  All other systems reviewed and are negative.  Current Meds  Medication Sig  . albuterol (PROVENTIL HFA;VENTOLIN HFA) 108 (90 Base) MCG/ACT inhaler Inhale 2 puffs into the lungs every 6 (six) hours as needed for wheezing or shortness of breath.   . ALPRAZolam (XANAX) 1 MG tablet Take 2 mg by mouth at bedtime.  Marland Kitchen HYDROcodone-acetaminophen (NORCO/VICODIN) 5-325 MG tablet Take 1 tablet by mouth every 6 (six) hours as needed.  Marland Kitchen ibuprofen (ADVIL,MOTRIN) 400 MG tablet Take 1 tablet (400 mg total) by mouth 4 (four) times daily.  . meclizine (ANTIVERT) 12.5 MG tablet Take 1 tablet (12.5 mg total) by mouth 3 (three) times daily as needed for dizziness.  . metroNIDAZOLE (FLAGYL) 500 MG tablet Take 500 mg by mouth 2 (two) times daily as needed (for diverticulitis flare ups).  . Multiple Vitamin (MULTIVITAMIN WITH MINERALS) TABS tablet Take 1 tablet by mouth daily.  Marland Kitchen triamcinolone cream (KENALOG) 0.1 % Apply 1 application topically 2 (two) times daily. Apply sparingly to neck rash for up to 10 days.  Marland Kitchen triamterene-hydrochlorothiazide (MAXZIDE) 75-50 MG tablet Take 0.5 tablets by mouth daily. Resume in 1 week (Patient taking differently: Take 0.5 tablets by mouth daily. )  . Wheat Dextrin (BENEFIBER DRINK MIX) PACK Take 4 g by mouth at bedtime.    Past Medical History:  Diagnosis Date  . Anxiety   . Asthma   . Chronic knee pain   . Diverticulitis   . Headache   . Hypertension    . Seasonal allergies   . Vertigo      Allergies  Allergen Reactions  . Iohexol Anaphylaxis       . Latex Other (See Comments)    Causes dark spots on skin, but not pain, or irritation.   . Benadryl [Diphenhydramine] Rash    Liquid. Pt states she can take pills.  . Sulfa Antibiotics Rash    Minor Shaking    BP 120/70   Pulse 89   Ht 4\' 11"  (1.499 m)   Wt 175 lb (79.4 kg)   BMI 35.35 kg/m    Physical Exam  Constitutional: She is oriented to person, place, and time. She appears well-developed and well-nourished.  Neurological: She is alert and oriented to person, place, and time.  Psychiatric: She has a normal mood and affect. Judgment normal.  Vitals reviewed.   Ortho Exam  Gait is abnormal with a limp favoring  She has lateral joint line tenderness or motion is diminished especially with flexion her knee is stable McMurray signs are positive strength is normal in the quadricep skin is intact there is no rash pulses good no edema lymph nodes are negative sensation is normal   Medical decision-making  Imaging: I reviewed the images   I agree with the report below   IMPRESSION: 1. Severe osteoarthritis of the lateral compartment with extensive cartilage loss, subcortical cyst formation and  edema, and extensive degeneration and peripheral subluxation of the lateral meniscus. 2. Complex tear of the anterior horn of the lateral meniscus with marked irregularity and fraying of the midbody and posterior horn. 3. Small joint effusion with at least 1 loose body in the joint.     Electronically Signed   By: Lorriane Shire M.D.   On: 12/13/2017 08:54     Encounter Diagnoses  Name Primary?  . Primary osteoarthritis of left knee Yes  . Lateral meniscus derangement, left     She has [facility] discount.  She is not a candidate for knee replacement as she cannot do the rehab based on the [facility] discount program so I offered her arthroscopy with a chance to improve but not  completely heal all of the problems going on in her knee   She says the knee is bothering her so much that she is agreed to proceed with surgery     Arther Abbott, MD 12/28/2017 11:50 AM

## 2017-12-28 NOTE — Patient Instructions (Addendum)
Meniscus Injury, Arthroscopy Arthroscopy is a surgical procedure that involves the use of a small scope that has a camera and surgical instruments on the end (arthroscope). An arthroscope can be used to repair your meniscus injury.  LET YOUR HEALTH CARE PROVIDER KNOW ABOUT:  Any allergies you have.  All medicines you are taking, including vitamins, herbs, eyedrops, creams, and over-the-counter medicines.  Any recent colds or infections you have had or currently have.  Previous problems you or members of your family have had with the use of anesthetics.  Any blood disorders or blood clotting problems you have.  Previous surgeries you have had.  Medical conditions you have. RISKS AND COMPLICATIONS Generally, this is a safe procedure. However, as with any procedure, problems can occur. Possible problems include:  Damage to nerves or blood vessels.  Excess bleeding.  Blood clots.  Infection. BEFORE THE PROCEDURE  Do not eat or drink for 6-8 hours before the procedure.  Take medicines as directed by your surgeon. Ask your surgeon about changing or stopping your regular medicines.  You may have lab tests the morning of surgery. PROCEDURE  You will be given one of the following:   A medicine that numbs the area (local anesthesia).  A medicine that makes you go to sleep (general anesthesia).  A medicine injected into your spine that numbs your body below the waist (spinal anesthesia). Most often, several small cuts (incisions) are made in the knee. The arthroscope and instruments go into the incisions to repair the damage. The torn portion of the meniscus is removed.   AFTER THE PROCEDURE  You will be taken to the recovery area where your progress will be monitored. When you are awake, stable, and taking fluids without complications, you will be allowed to go home. This is usually the same day. A torn or stretched ligament (ligament sprain) may take 6-8 weeks to heal.   It  takes about the 4-6 WEEKS if your surgeon removed a torn meniscus.  A repaired meniscus may require 6-12 weeks of recovery time.  A torn ligament needing reconstructive surgery may take 6-12 months to heal fully.   This information is not intended to replace advice given to you by your health care provider. Make sure you discuss any questions you have with your health care provider. You have decided to proceed with operative arthroscopy of the knee. You have decided not to continue with nonoperative measures such as but not limited to oral medication, weight loss, activity modification, physical therapy, bracing, or injection.  We will perform operative arthroscopy of the knee. Some of the risks associated with arthroscopic surgery of the knee include but are not limited to Bleeding Infection Swelling Stiffness Blood clot Pain  If you're not comfortable with these risks and would like to continue with nonoperative treatment please let Dr. Kateland Leisinger know prior to your surgery.   Document Released: 12/04/2000 Document Revised: 12/12/2013 Document Reviewed: 05/05/2013 Elsevier Interactive Patient Education 2016 [employer] You have decided to proceed with operative arthroscopy of the knee. You have decided not to continue with nonoperative measures such as but not limited to oral medication, weight loss, activity modification, physical therapy, bracing, or injection.  We will perform operative arthroscopy of the knee. Some of the risks associated with arthroscopic surgery of the knee include but are not limited to Bleeding Infection Swelling Stiffness Blood clot Pain  If you're not comfortable with these risks and would like to continue with nonoperative treatment please let Dr. Shunta Mclaurin   know prior to your surgery.  Needs new oow to feb 1

## 2017-12-29 ENCOUNTER — Telehealth: Payer: Self-pay | Admitting: Orthopedic Surgery

## 2017-12-29 ENCOUNTER — Telehealth: Payer: Self-pay | Admitting: Radiology

## 2017-12-29 NOTE — Telephone Encounter (Signed)
Patient was returning your call. Please give her a call after clinic.

## 2017-12-29 NOTE — Telephone Encounter (Signed)
I called patient, she has the [place] discount This will expire tomorrow She is sch for surgery on 01/13/18/ what is her coverage after the termination of the [place] program, or is she enrolled again ?  Left message for her to call me back

## 2017-12-30 NOTE — Telephone Encounter (Signed)
I have also spoken with patient; as noted, she is working with [facility] counselor, and states her application is pending the recept of a bill. Patient also made aware that other charges will be incurred during course of surgery that are non-[place], including anesthesia.  Voiced understanding.

## 2017-12-30 NOTE — Telephone Encounter (Signed)
I spoke to patient, she states her discount runs out today with [employer].  She states she can not re apply until she is billed for the surgery  She was told she will meet qualifications but not until she has the surgery

## 2018-01-03 NOTE — Telephone Encounter (Signed)
Patient is now asking about other options/recommendations other than surgery. Soonest available date for appointment is 01/11/18, and surgery schedule date is 01/13/18.  (As for patient "needing a bill" in order to re-apply for 100%discount, finance counselor has been able to pull forward another bill, so re-app is in progress.  Schedule appointment or Dr to advise?  Ph# [phone] (before 1:30pm)

## 2018-01-04 NOTE — Patient Instructions (Signed)
[name]  01/04/2018     @PREFPERIOPPHARMACY @   Your procedure is scheduled on  01/13/2018   Report to [facility] at  825   A.M.  Call this number if you have problems the morning of surgery:  [phone]   Remember:  Do not eat food or drink liquids after midnight.  Take these medicines the morning of surgery with A SIP OF WATER  Antivert, oxycodone, maxzide. Use your inhalers before you come and bring them with you.   Do not wear jewelry, make-up or nail polish.  Do not wear lotions, powders, or perfumes, or deodorant.  Do not shave 48 hours prior to surgery.  Men may shave face and neck.  Do not bring valuables to the hospital.  [facility] is not responsible for any belongings or valuables.  Contacts, dentures or bridgework may not be worn into surgery.  Leave your suitcase in the car.  After surgery it may be brought to your room.  For patients admitted to the hospital, discharge time will be determined by your treatment team.  Patients discharged the day of surgery will not be allowed to drive home.   Name and phone number of your driver:   family Special instructions:  None  Please read over the following fact sheets that you were given. Anesthesia Post-op Instructions and Care and Recovery After Surgery      Knee Ligament Injury, Arthroscopy Arthroscopy is a surgical technique in which your health care provider examines your knee through a small, pencil-sized telescope (arthroscope). Often, repairs to injured ligaments can be done with instruments in the arthroscope. Arthroscopy is less invasive than open-knee surgery. Tell a health care provider about:  Any allergies you have.  All medicines you are taking, including vitamins, herbs, eye drops, creams, and over-the-counter medicines.  Any problems you or family members have had with anesthetic medicines.  Any blood disorders you have.  Any surgeries you have had.  Any medical  conditions you have. What are the risks? Generally, this is a safe procedure. However, as with any procedure, problems can occur. Possible problems include:  Infection.  Bleeding.  Stiffness.  What happens before the procedure?  Ask your health care provider about changing or stopping any regular medicines. Avoid taking aspirin or blood thinners as directed by your health care provider.  Do not eat or drink anything after midnight the night before surgery.  If you smoke, do not smoke for at least 2 weeks before your surgery.  Do not drink alcohol starting the day before your surgery.  Let your health care provider know if you develop a cold or any infection before your surgery.  Arrange for someone to drive you home after the surgery or after your hospital stay. Also arrange for someone to help you with activities during recovery. What happens during the procedure?  Small monitors will be put on your body. They are used to check your heart, blood pressure, and oxygen levels.  An IV access tube will be put into one of your veins. Medicine will be able to flow directly into your body through this IV tube.  You might be given a medicine to help you relax (sedative).  You will be given a medicine that makes you go to sleep (general anesthetic), and a breathing tube will be placed into your lungs during the procedure.  Several small incisions are made in your knee. Saline fluid  is placed into one of the incisions to expand the knee and clear away any blood in the knee.  Your health care provider will insert the arthroscope to examine the injured knee.  During arthroscopy, your health care provider may find a partial or complete tear in a ligament.  Tools can be inserted through the other incisions to repair the injured ligaments.  The incisions are then closed with absorbable stitches and covered with dressings. What happens after the procedure?  You will be taken to the  recovery area where you will be monitored.  When you are awake, stable, and taking fluids without problems, you will be allowed to go home. This information is not intended to replace advice given to you by your health care provider. Make sure you discuss any questions you have with your health care provider. Document Released: 12/04/2000 Document Revised: 05/14/2016 Document Reviewed: 07/19/2013 Elsevier Interactive Patient Education  2017 [place].  Arthroscopic Knee Ligament Repair, Care After This sheet gives you information about how to care for yourself after your procedure. Your health care provider may also give you more specific instructions. If you have problems or questions, contact your health care provider. What can I expect after the procedure? After the procedure, it is common to have:  Pain in your knee.  Bruising and swelling on your knee, calf, and ankle for 3-4 days.  Fatigue.  Follow these instructions at home: If you have a brace or immobilizer:  Wear the brace or immobilizer as told by your health care provider. Remove it only as told by your health care provider.  Loosen the splint or immobilizer if your toes tingle, become numb, or turn cold and blue.  Keep the brace or immobilizer clean. Bathing  Do not take baths, swim, or use a hot tub until your health care provider approves. Ask your health care provider if you can take showers.  Keep your bandage (dressing) dry until your health care provider says that it can be removed. Cover it and your brace or immobilizer with a watertight covering when you take a shower. Incision care  Follow instructions from your health care provider about how to take care of your incision. Make sure you: ? Wash your hands with soap and water before you change your bandage (dressing). If soap and water are not available, use hand sanitizer. ? Change your dressing as told by your health care provider. ? Leave stitches  (sutures), skin glue, or adhesive strips in place. These skin closures may need to stay in place for 2 weeks or longer. If adhesive strip edges start to loosen and curl up, you may trim the loose edges. Do not remove adhesive strips completely unless your health care provider tells you to do that.  Check your incision area every day for signs of infection. Check for: ? More redness, swelling, or pain. ? More fluid or blood. ? Warmth. ? Pus or a bad smell. Managing pain, stiffness, and swelling  If directed, put ice on the affected area. ? If you have a removable brace or immobilizer, remove it as told by your health care provider. ? Put ice in a plastic bag. ? Place a towel between your skin and the bag or between your brace or immobilizer and the bag. ? Leave the ice on for 20 minutes, 2-3 times a day.  Move your toes often to avoid stiffness and to lessen swelling.  Raise (elevate) the injured area above the level of your heart  while you are sitting or lying down. Driving  Do not drive until your health care provider approves. If you have a brace or immobilizer on your leg, ask your health care provider when it is safe for you to drive.  Do not drive or use heavy machinery while taking prescription pain medicine. Activity  Rest as directed. Ask your health care provider what activities are safe for you.  Do physical therapy exercises as told by your health care provider. Physical therapy will help you regain strength and motion in your knee.  Follow instructions from your health care provider about: ? When you may start motion exercises. ? When you may start riding a stationary bike and doing other low-impact activities. ? When you may start to jog and do other high-impact activities. Safety  Do not use the injured limb to support your body weight until your health care provider says that you can. Use crutches as told by your health care provider. General instructions  Do not  use any products that contain nicotine or tobacco, such as cigarettes and e-cigarettes. These can delay bone healing. If you need help quitting, ask your health care provider.  To prevent or treat constipation while you are taking prescription pain medicine, your health care provider may recommend that you: ? Drink enough fluid to keep your urine clear or pale yellow. ? Take over-the-counter or prescription medicines. ? Eat foods that are high in fiber, such as fresh fruits and vegetables, whole grains, and beans. ? Limit foods that are high in fat and processed sugars, such as fried and sweet foods.  Take over-the-counter and prescription medicines only as told by your health care provider.  Keep all follow-up visits as told by your health care provider. This is important. Contact a health care provider if:  You have more redness, swelling, or pain around an incision.  You have more fluid or blood coming from an incision.  Your incision feels warm to the touch.  You have a fever.  You have pain or swelling in your knee, and it gets worse.  You have pain that does not get better with medicine. Get help right away if:  You have trouble breathing.  You have pus or a bad smell coming from an incision.  You have numbness and tingling near the knee joint. Summary  After the procedure, it is common to have knee pain with bruising and swelling on your knee, calf, and ankle.  Icing your knee and raising your leg above the level of your heart will help control the pain and the swelling.  Do physical therapy exercises as told by your health care provider. Physical therapy will help you regain strength and motion in your knee. This information is not intended to replace advice given to you by your health care provider. Make sure you discuss any questions you have with your health care provider. Document Released: 09/27/2013 Document Revised: 12/01/2016 Document Reviewed:  12/01/2016 Elsevier Interactive Patient Education  2017 [place] Anesthesia, Adult General anesthesia is the use of medicines to make a person "go to sleep" (be unconscious) for a medical procedure. General anesthesia is often recommended when a procedure:  Is long.  Requires you to be still or in an unusual position.  Is major and can cause you to lose blood.  Is impossible to do without general anesthesia.  The medicines used for general anesthesia are called general anesthetics. In addition to making you sleep, the medicines:  Prevent pain.  Control your blood pressure.  Relax your muscles.  Tell a health care provider about:  Any allergies you have.  All medicines you are taking, including vitamins, herbs, eye drops, creams, and over-the-counter medicines.  Any problems you or family members have had with anesthetic medicines.  Types of anesthetics you have had in the past.  Any bleeding disorders you have.  Any surgeries you have had.  Any medical conditions you have.  Any history of heart or lung conditions, such as heart failure, sleep apnea, or chronic obstructive pulmonary disease (COPD).  Whether you are pregnant or may be pregnant.  Whether you use tobacco, alcohol, marijuana, or street drugs.  Any history of Armed forces logistics/support/administrative officer.  Any history of depression or anxiety. What are the risks? Generally, this is a safe procedure. However, problems may occur, including:  Allergic reaction to anesthetics.  Lung and heart problems.  Inhaling food or liquids from your stomach into your lungs (aspiration).  Injury to nerves.  Waking up during your procedure and being unable to move (rare).  Extreme agitation or a state of mental confusion (delirium) when you wake up from the anesthetic.  Air in the bloodstream, which can lead to stroke.  These problems are more likely to develop if you are having a major surgery or if you have an advanced  medical condition. You can prevent some of these complications by answering all of your health care provider's questions thoroughly and by following all pre-procedure instructions. General anesthesia can cause side effects, including:  Nausea or vomiting  A sore throat from the breathing tube.  Feeling cold or shivery.  Feeling tired, washed out, or achy.  Sleepiness or drowsiness.  Confusion or agitation.  What happens before the procedure? Staying hydrated Follow instructions from your health care provider about hydration, which may include:  Up to 2 hours before the procedure - you may continue to drink clear liquids, such as water, clear fruit juice, black coffee, and plain tea.  Eating and drinking restrictions Follow instructions from your health care provider about eating and drinking, which may include:  8 hours before the procedure - stop eating heavy meals or foods such as meat, fried foods, or fatty foods.  6 hours before the procedure - stop eating light meals or foods, such as toast or cereal.  6 hours before the procedure - stop drinking milk or drinks that contain milk.  2 hours before the procedure - stop drinking clear liquids.  Medicines  Ask your health care provider about: ? Changing or stopping your regular medicines. This is especially important if you are taking diabetes medicines or blood thinners. ? Taking medicines such as aspirin and ibuprofen. These medicines can thin your blood. Do not take these medicines before your procedure if your health care provider instructs you not to. ? Taking new dietary supplements or medicines. Do not take these during the week before your procedure unless your health care provider approves them.  If you are told to take a medicine or to continue taking a medicine on the day of the procedure, take the medicine with sips of water. General instructions   Ask if you will be going home the same day, the following day,  or after a longer hospital stay. ? Plan to have someone take you home. ? Plan to have someone stay with you for the first 24 hours after you leave the hospital or clinic.  For 3-6 weeks before the procedure, try not  to use any tobacco products, such as cigarettes, chewing tobacco, and e-cigarettes.  You may brush your teeth on the morning of the procedure, but make sure to spit out the toothpaste. What happens during the procedure?  You will be given anesthetics through a mask and through an IV tube in one of your veins.  You may receive medicine to help you relax (sedative).  As soon as you are asleep, a breathing tube may be used to help you breathe.  An anesthesia specialist will stay with you throughout the procedure. He or she will help keep you comfortable and safe by continuing to give you medicines and adjusting the amount of medicine that you get. He or she will also watch your blood pressure, pulse, and oxygen levels to make sure that the anesthetics do not cause any problems.  If a breathing tube was used to help you breathe, it will be removed before you wake up. The procedure may vary among health care providers and hospitals. What happens after the procedure?  You will wake up, often slowly, after the procedure is complete, usually in a recovery area.  Your blood pressure, heart rate, breathing rate, and blood oxygen level will be monitored until the medicines you were given have worn off.  You may be given medicine to help you calm down if you feel anxious or agitated.  If you will be going home the same day, your health care provider may check to make sure you can stand, drink, and urinate.  Your health care providers will treat your pain and side effects before you go home.  Do not drive for 24 hours if you received a sedative.  You may: ? Feel nauseous and vomit. ? Have a sore throat. ? Have mental slowness. ? Feel cold or shivery. ? Feel sleepy. ? Feel  tired. ? Feel sore or achy, even in parts of your body where you did not have surgery. This information is not intended to replace advice given to you by your health care provider. Make sure you discuss any questions you have with your health care provider. Document Released: 03/15/2008 Document Revised: 05/19/2016 Document Reviewed: 11/21/2015 Elsevier Interactive Patient Education  2018 [place] Anesthesia, Adult, Care After These instructions provide you with information about caring for yourself after your procedure. Your health care provider may also give you more specific instructions. Your treatment has been planned according to current medical practices, but problems sometimes occur. Call your health care provider if you have any problems or questions after your procedure. What can I expect after the procedure? After the procedure, it is common to have:  Vomiting.  A sore throat.  Mental slowness.  It is common to feel:  Nauseous.  Cold or shivery.  Sleepy.  Tired.  Sore or achy, even in parts of your body where you did not have surgery.  Follow these instructions at home: For at least 24 hours after the procedure:  Do not: ? Participate in activities where you could fall or become injured. ? Drive. ? Use heavy machinery. ? Drink alcohol. ? Take sleeping pills or medicines that cause drowsiness. ? Make important decisions or sign legal documents. ? Take care of children on your own.  Rest. Eating and drinking  If you vomit, drink water, juice, or soup when you can drink without vomiting.  Drink enough fluid to keep your urine clear or pale yellow.  Make sure you have little or no nausea before eating solid  foods.  Follow the diet recommended by your health care provider. General instructions  Have a responsible adult stay with you until you are awake and alert.  Return to your normal activities as told by your health care provider. Ask your  health care provider what activities are safe for you.  Take over-the-counter and prescription medicines only as told by your health care provider.  If you smoke, do not smoke without supervision.  Keep all follow-up visits as told by your health care provider. This is important. Contact a health care provider if:  You continue to have nausea or vomiting at home, and medicines are not helpful.  You cannot drink fluids or start eating again.  You cannot urinate after 8-12 hours.  You develop a skin rash.  You have fever.  You have increasing redness at the site of your procedure. Get help right away if:  You have difficulty breathing.  You have chest pain.  You have unexpected bleeding.  You feel that you are having a life-threatening or urgent problem. This information is not intended to replace advice given to you by your health care provider. Make sure you discuss any questions you have with your health care provider. Document Released: 03/15/2001 Document Revised: 05/11/2016 Document Reviewed: 11/21/2015 Elsevier Interactive Patient Education  [employer].

## 2018-01-04 NOTE — Telephone Encounter (Signed)
I can inject the joint and place her on nsaid   I dont want my schedule overbooked so Ill have to find a time for her to come in if she wants injection

## 2018-01-04 NOTE — Telephone Encounter (Signed)
I have asked for patient to come in and discuss, but [name] wants you to advise.

## 2018-01-04 NOTE — Telephone Encounter (Signed)
She is asking for something other than surgery since she is self pay, I have advised patient should come in, but Arbie Cookey wants you to offer something, or for you to say if patient should come in or not.

## 2018-01-04 NOTE — Telephone Encounter (Signed)
Advise on....????

## 2018-01-05 NOTE — Telephone Encounter (Signed)
Dr Aline Brochure does want to see her. He states he can try an injection. Will you call to schedule, next available, he has asked not to overbook please.

## 2018-01-06 NOTE — Telephone Encounter (Signed)
01/05/18, 4:59pm, called patient; scheduled appointment accordingly. Patient aware.

## 2018-01-07 ENCOUNTER — Encounter (HOSPITAL_COMMUNITY): Payer: Self-pay

## 2018-01-07 ENCOUNTER — Encounter (HOSPITAL_COMMUNITY)
Admission: RE | Admit: 2018-01-07 | Discharge: 2018-01-07 | Disposition: A | Discharge status: Home / Self Care | Payer: Self-pay | Source: Ambulatory Visit | Attending: Orthopedic Surgery | Admitting: Orthopedic Surgery

## 2018-01-11 ENCOUNTER — Encounter: Payer: Self-pay | Admitting: Orthopedic Surgery

## 2018-01-11 ENCOUNTER — Ambulatory Visit (INDEPENDENT_AMBULATORY_CARE_PROVIDER_SITE_OTHER): Payer: Self-pay | Admitting: Orthopedic Surgery

## 2018-01-11 VITALS — BP 160/100 | HR 74 | Ht 59.0 in | Wt 175.0 lb

## 2018-01-11 DIAGNOSIS — M23301 Other meniscus derangements, unspecified lateral meniscus, left knee: Secondary | ICD-10-CM

## 2018-01-11 DIAGNOSIS — M1712 Unilateral primary osteoarthritis, left knee: Secondary | ICD-10-CM

## 2018-01-11 DIAGNOSIS — G8929 Other chronic pain: Secondary | ICD-10-CM

## 2018-01-11 DIAGNOSIS — M25562 Pain in left knee: Secondary | ICD-10-CM

## 2018-01-11 NOTE — Progress Notes (Signed)
Progress Note   Patient ID: [name], female   DOB: November 16, 1956, 62 y.o.   MRN: [id number]  Chief Complaint  Patient presents with  . Follow-up    Recheck on left knee.    HPI  62 year old female was previously scheduled for surgery declined surgery wanted to have injection and see if that would work ROS No outpatient medications have been marked as taking for the 01/11/18 encounter (Office Visit) with Carole Civil, MD.    Allergies  Allergen Reactions  . Iohexol Anaphylaxis       . Latex Other (See Comments)    Causes dark spots on skin, but not pain, or irritation.   . Benadryl [Diphenhydramine] Rash and Other (See Comments)    Liquid. Pt states she can take pills.  . Sulfa Antibiotics Rash and Other (See Comments)    Minor Shaking     BP (!) 160/100   Pulse 74   Ht 4\' 11"  (1.499 m)   Wt 175 lb (79.4 kg)   BMI 35.35 kg/m   Physical Exam   Medical decision-making Encounter Diagnoses  Name Primary?  . Primary osteoarthritis of left knee Yes  . Chronic pain of left knee   . Lateral meniscus derangement, left     Procedure note left knee injection verbal consent was obtained to inject left knee joint  Timeout was completed to confirm the site of injection  The medications used were 40 mg of Depo-Medrol and 1% lidocaine 3 cc  Anesthesia was provided by ethyl chloride and the skin was prepped with alcohol.  After cleaning the skin with alcohol a 20-gauge needle was used to inject the left knee joint. There were no complications. A sterile bandage was applied.      Arther Abbott, MD 01/11/2018 9:08 AM

## 2018-01-11 NOTE — Patient Instructions (Signed)
Return to work   You have received an injection of steroids into the joint. 15% of patients will have increased pain within the 24 hours postinjection.   This is transient and will go away.   We recommend that you use ice packs on the injection site for 20 minutes every 2 hours and extra strength Tylenol 2 tablets every 8 as needed until the pain resolves.  If you continue to have pain after taking the Tylenol and using the ice please call the office for further instructions.

## 2018-01-13 ENCOUNTER — Ambulatory Visit (HOSPITAL_COMMUNITY): Admission: RE | Admit: 2018-01-13 | Payer: Self-pay | Source: Ambulatory Visit | Admitting: Orthopedic Surgery

## 2018-01-13 ENCOUNTER — Encounter (HOSPITAL_COMMUNITY): Admission: RE | Payer: Self-pay | Source: Ambulatory Visit

## 2018-01-13 SURGERY — ARTHROSCOPY, KNEE, WITH LATERAL MENISCECTOMY
Anesthesia: Choice | Laterality: Left

## 2018-02-02 ENCOUNTER — Other Ambulatory Visit (HOSPITAL_COMMUNITY)
Admission: RE | Admit: 2018-02-02 | Discharge: 2018-02-02 | Disposition: A | Discharge status: Home / Self Care | Payer: Self-pay | Source: Ambulatory Visit | Attending: Family Medicine | Admitting: Family Medicine

## 2018-02-02 DIAGNOSIS — E6609 Other obesity due to excess calories: Secondary | ICD-10-CM | POA: Insufficient documentation

## 2018-02-02 DIAGNOSIS — I10 Essential (primary) hypertension: Secondary | ICD-10-CM | POA: Insufficient documentation

## 2018-02-02 DIAGNOSIS — F419 Anxiety disorder, unspecified: Secondary | ICD-10-CM | POA: Insufficient documentation

## 2018-02-02 LAB — CBC
HCT: 40.6 % (ref 36.0–46.0)
HEMOGLOBIN: 12.6 g/dL (ref 12.0–15.0)
MCH: 27.2 pg (ref 26.0–34.0)
MCHC: 31 g/dL (ref 30.0–36.0)
MCV: 87.5 fL (ref 78.0–100.0)
Platelets: 311 10*3/uL (ref 150–400)
RBC: 4.64 MIL/uL (ref 3.87–5.11)
RDW: 13.8 % (ref 11.5–15.5)
WBC: 9.5 10*3/uL (ref 4.0–10.5)

## 2018-02-02 LAB — COMPREHENSIVE METABOLIC PANEL
ALK PHOS: 113 U/L (ref 38–126)
ALT: 20 U/L (ref 14–54)
ANION GAP: 12 (ref 5–15)
AST: 23 U/L (ref 15–41)
Albumin: 4 g/dL (ref 3.5–5.0)
BUN: 16 mg/dL (ref 6–20)
CALCIUM: 10.4 mg/dL — AB (ref 8.9–10.3)
CO2: 23 mmol/L (ref 22–32)
CREATININE: 1.02 mg/dL — AB (ref 0.44–1.00)
Chloride: 99 mmol/L — ABNORMAL LOW (ref 101–111)
GFR, EST NON AFRICAN AMERICAN: 57 mL/min — AB (ref >60)
Glucose, Bld: 110 mg/dL — ABNORMAL HIGH (ref 65–99)
Potassium: 3.4 mmol/L — ABNORMAL LOW (ref 3.5–5.1)
Sodium: 134 mmol/L — ABNORMAL LOW (ref 135–145)
TOTAL PROTEIN: 7.6 g/dL (ref 6.5–8.1)
Total Bilirubin: 0.6 mg/dL (ref 0.3–1.2)

## 2018-02-02 LAB — LIPID PANEL
CHOLESTEROL: 195 mg/dL (ref 0–200)
HDL: 75 mg/dL (ref >40)
LDL CALC: 102 mg/dL — AB (ref 0–99)
TRIGLYCERIDES: 91 mg/dL (ref <150)
Total CHOL/HDL Ratio: 2.6 RATIO
VLDL: 18 mg/dL (ref 0–40)

## 2018-02-02 LAB — TSH: TSH: 1.824 u[IU]/mL (ref 0.350–4.500)

## 2018-02-04 ENCOUNTER — Emergency Department (HOSPITAL_COMMUNITY)
Admission: EM | Admit: 2018-02-04 | Discharge: 2018-02-04 | Disposition: A | Discharge status: Home / Self Care | Payer: Self-pay | Attending: Emergency Medicine | Admitting: Emergency Medicine

## 2018-02-04 ENCOUNTER — Encounter (HOSPITAL_COMMUNITY): Payer: Self-pay

## 2018-02-04 DIAGNOSIS — R51 Headache: Secondary | ICD-10-CM | POA: Insufficient documentation

## 2018-02-04 DIAGNOSIS — H9209 Otalgia, unspecified ear: Secondary | ICD-10-CM | POA: Insufficient documentation

## 2018-02-04 DIAGNOSIS — Z5321 Procedure and treatment not carried out due to patient leaving prior to being seen by health care provider: Secondary | ICD-10-CM | POA: Insufficient documentation

## 2018-02-04 NOTE — ED Triage Notes (Signed)
Pt reports that she has a headache, right ear pain, ST for 3 days. Pt reports that she is dizzy and unsteady which is normal

## 2018-02-04 NOTE — ED Notes (Signed)
No reply:

## 2018-02-04 NOTE — ED Notes (Signed)
No reply when called

## 2018-02-04 NOTE — ED Notes (Signed)
Pt called. No reply

## 2018-02-05 ENCOUNTER — Other Ambulatory Visit: Payer: Self-pay

## 2018-02-05 ENCOUNTER — Emergency Department (HOSPITAL_COMMUNITY)
Admission: EM | Admit: 2018-02-05 | Discharge: 2018-02-05 | Disposition: A | Discharge status: Home / Self Care | Payer: Self-pay | Attending: Emergency Medicine | Admitting: Emergency Medicine

## 2018-02-05 ENCOUNTER — Encounter (HOSPITAL_COMMUNITY): Payer: Self-pay | Admitting: Emergency Medicine

## 2018-02-05 DIAGNOSIS — R51 Headache: Secondary | ICD-10-CM | POA: Insufficient documentation

## 2018-02-05 DIAGNOSIS — Z91040 Latex allergy status: Secondary | ICD-10-CM | POA: Insufficient documentation

## 2018-02-05 DIAGNOSIS — J45909 Unspecified asthma, uncomplicated: Secondary | ICD-10-CM | POA: Insufficient documentation

## 2018-02-05 DIAGNOSIS — R519 Headache, unspecified: Secondary | ICD-10-CM

## 2018-02-05 DIAGNOSIS — I10 Essential (primary) hypertension: Secondary | ICD-10-CM | POA: Insufficient documentation

## 2018-02-05 DIAGNOSIS — Z79899 Other long term (current) drug therapy: Secondary | ICD-10-CM | POA: Insufficient documentation

## 2018-02-05 MED ORDER — KETOROLAC TROMETHAMINE 30 MG/ML IJ SOLN
30.0000 mg | Freq: Once | INTRAMUSCULAR | Status: AC
  Administered 2018-02-05: 30 mg via INTRAVENOUS
  Filled 2018-02-05: qty 1

## 2018-02-05 MED ORDER — SODIUM CHLORIDE 0.9 % IV BOLUS (SEPSIS)
500.0000 mL | Freq: Once | INTRAVENOUS | Status: AC
  Administered 2018-02-05: 500 mL via INTRAVENOUS

## 2018-02-05 MED ORDER — TETRACAINE HCL 0.5 % OP SOLN
2.0000 [drp] | Freq: Once | OPHTHALMIC | Status: DC
  Filled 2018-02-05: qty 4

## 2018-02-05 MED ORDER — FLUORESCEIN SODIUM 1 MG OP STRP
1.0000 | ORAL_STRIP | Freq: Once | OPHTHALMIC | Status: DC
  Filled 2018-02-05: qty 1

## 2018-02-05 MED ORDER — PROMETHAZINE HCL 25 MG/ML IJ SOLN
12.5000 mg | Freq: Once | INTRAMUSCULAR | Status: DC
  Filled 2018-02-05: qty 1

## 2018-02-05 NOTE — ED Provider Notes (Signed)
[facility] EMERGENCY DEPARTMENT Provider Note   CSN: [id number] Arrival date & time: 02/05/18  1520     History   Chief Complaint Chief Complaint  Patient presents with  . Headache    HPI [name] is a 62 y.o. female.  HPI Patient presents with right had a headache.  Has had it for the last 4 days.  Came to the ER yesterday but left without being seen.  Headache is throbbing.  Worse on the right side of her head with some tearing of her right eye and less on her left eye.  Some mild photophobia.  No trauma.  No vision changes.  Does have a history of headaches.  No fevers or chills. Past Medical History:  Diagnosis Date  . Anxiety   . Asthma   . Chronic knee pain   . Diverticulitis   . Headache   . Hypertension   . Seasonal allergies   . Vertigo     Patient Active Problem List   Diagnosis Date Noted  . Diverticulitis of colon 09/15/2017  . Diverticulitis 05/24/2017  . AKI (acute kidney injury) ([place]) 05/24/2017  . Hypokalemia 05/24/2017  . Essential hypertension 05/24/2017  . Vertigo 05/24/2017  . CAP (community acquired pneumonia) 05/04/2015  . Sepsis ([place]) 05/02/2015  . Acute respiratory failure ([place]) 05/02/2015    Past Surgical History:  Procedure Laterality Date  . ABDOMINAL HYSTERECTOMY    . ABLATION COLPOCLESIS    . CHOLECYSTECTOMY    . COLONOSCOPY WITH PROPOFOL N/A 10/04/2017   Procedure: COLONOSCOPY WITH PROPOFOL;  Surgeon: Rogene Houston, MD;  [facility];  Service: Endoscopy;  Laterality: N/A;  1:15  . POLYPECTOMY  10/04/2017   Procedure: POLYPECTOMY;  Surgeon: Rogene Houston, MD;  Location: [facility];  Service: Endoscopy;;  sigmoid colon polyp    OB History    Gravida Para Term Preterm AB Living   3 2 2   1      SAB TAB Ectopic Multiple Live Births   1               Home Medications    Prior to Admission medications   Medication Sig Start Date End Date Taking? Authorizing Provider  albuterol (PROVENTIL  HFA;VENTOLIN HFA) 108 (90 Base) MCG/ACT inhaler Inhale 2 puffs into the lungs every 6 (six) hours as needed for wheezing or shortness of breath.    Yes [provider]  ALPRAZolam Duanne Moron) 1 MG tablet Take 2 mg by mouth at bedtime.   Yes [provider]  Multiple Vitamin (MULTIVITAMIN WITH MINERALS) TABS tablet Take 1 tablet by mouth daily.   Yes [provider]  oxyCODONE-acetaminophen (PERCOCET) 10-325 MG tablet Take 1 tablet by mouth 3 (three) times daily as needed for pain.   Yes [provider]  triamcinolone cream (KENALOG) 0.1 % Apply 1 application topically 2 (two) times daily. Apply sparingly to neck rash for up to 10 days. Patient taking differently: Apply 1 application topically 2 (two) times daily as needed (for rash).  09/23/17  Yes Idol, Almyra Free, PA-C  triamterene-hydrochlorothiazide (MAXZIDE) 75-50 MG tablet Take 0.5 tablets by mouth daily. Resume in 1 week Patient taking differently: Take 0.5 tablets by mouth daily.  05/25/17  Yes Kathie Dike, MD  Wheat Dextrin (BENEFIBER DRINK MIX) PACK Take 4 g by mouth at bedtime. Patient taking differently: Take 4 g by mouth once a week.  10/04/17  Yes Rehman, Mechele Dawley, MD    Family History Family  History  Problem Relation Age of Onset  . Emphysema Mother   . Stroke Father   . Hypertension Father   . Diabetes Sister   . Hypertension Sister   . Stroke Brother   . Hypertension Brother     Social History Social History   Tobacco Use  . Smoking status: Never Smoker  . Smokeless tobacco: Never Used  Substance Use Topics  . Alcohol use: No  . Drug use: No     Allergies   Iohexol; Latex; Benadryl [diphenhydramine]; and Sulfa antibiotics   Review of Systems Review of Systems  Constitutional: Positive for appetite change.  HENT: Negative for congestion.   Eyes: Positive for photophobia.  Respiratory: Negative for shortness of breath.   Cardiovascular: Negative for chest pain.    Gastrointestinal: Positive for nausea. Negative for abdominal distention.  Genitourinary: Negative for flank pain.  Musculoskeletal: Negative for back pain.  Neurological: Positive for headaches.  Hematological: Negative for adenopathy.  Psychiatric/Behavioral: Negative for agitation.     Physical Exam Updated Vital Signs BP 118/72   Pulse 84   Temp 98.7 F (37.1 C) (Oral)   Resp 17   Ht 4\' 11"  (1.499 m)   Wt 79.4 kg (175 lb)   SpO2 97%   BMI 35.35 kg/m   Physical Exam  Constitutional: She is oriented to person, place, and time. She appears well-developed and well-nourished.  HENT:  Head: Atraumatic.  Eyes: EOM are normal. Pupils are equal, round, and reactive to light.  Mild tearing from right eye.  Some photophobia and right eye.  Neck: Neck supple.  Cardiovascular: Normal rate.  Pulmonary/Chest: Effort normal.  Abdominal: Soft.  Musculoskeletal: Normal range of motion.  Neurological: She is alert and oriented to person, place, and time.  Skin: Skin is warm. Capillary refill takes less than 2 seconds.  Psychiatric: She has a normal mood and affect.     ED Treatments / Results  Labs (all labs ordered are listed, but only abnormal results are displayed) Labs Reviewed - No data to display  EKG  EKG Interpretation None       Radiology No results found.  Procedures Procedures (including critical care time)  Medications Ordered in ED Medications  promethazine (PHENERGAN) injection 12.5 mg (12.5 mg Intravenous Refused 02/05/18 2019)  sodium chloride 0.9 % bolus 500 mL (0 mLs Intravenous Stopped 02/05/18 2047)  ketorolac (TORADOL) 30 MG/ML injection 30 mg (30 mg Intravenous Given 02/05/18 2019)  tetracaine (PONTOCAINE) 0.5 % ophthalmic solution 2 drop (2 drops Right Eye Given by Other 02/05/18 2012)  fluorescein ophthalmic strip 1 strip (1 strip Right Eye Given by Other 02/05/18 2012)     Initial Impression / Assessment and Plan / ED Course  I have reviewed  the triage vital signs and the nursing notes.  Pertinent labs & imaging results that were available during my care of the patient were reviewed by me and considered in my medical decision making (see chart for details).     Patient with headache.  Right-sided.  Given high flow oxygen due to possibility of cluster headache.  Between this and the migraine cocktail symptoms resolved.  Initial plan to check pressure in the right eye but with normal pupils and the fact the pain resolved.  This was not done.  Likely cluster versus migraine type headache.  Will discharge home.  Final Clinical Impressions(s) / ED Diagnoses   Final diagnoses:  Acute nonintractable headache, unspecified headache type    ED Discharge Orders  None       Davonna Belling, MD 02/05/18 2134

## 2018-02-05 NOTE — ED Triage Notes (Signed)
Pt states HA since Tuesday, LWBS yesterday. Deneis OTC medication today.

## 2018-05-20 ENCOUNTER — Emergency Department (HOSPITAL_COMMUNITY)
Admission: EM | Admit: 2018-05-20 | Discharge: 2018-05-20 | Disposition: A | Discharge status: Home / Self Care | Payer: Self-pay | Attending: Emergency Medicine | Admitting: Emergency Medicine

## 2018-05-20 ENCOUNTER — Encounter (HOSPITAL_COMMUNITY): Payer: Self-pay | Admitting: Emergency Medicine

## 2018-05-20 ENCOUNTER — Other Ambulatory Visit: Payer: Self-pay

## 2018-05-20 DIAGNOSIS — I10 Essential (primary) hypertension: Secondary | ICD-10-CM | POA: Insufficient documentation

## 2018-05-20 DIAGNOSIS — Z91040 Latex allergy status: Secondary | ICD-10-CM | POA: Insufficient documentation

## 2018-05-20 DIAGNOSIS — M545 Low back pain, unspecified: Secondary | ICD-10-CM

## 2018-05-20 DIAGNOSIS — J45909 Unspecified asthma, uncomplicated: Secondary | ICD-10-CM | POA: Insufficient documentation

## 2018-05-20 DIAGNOSIS — N390 Urinary tract infection, site not specified: Secondary | ICD-10-CM | POA: Insufficient documentation

## 2018-05-20 DIAGNOSIS — E860 Dehydration: Secondary | ICD-10-CM | POA: Insufficient documentation

## 2018-05-20 DIAGNOSIS — Z79899 Other long term (current) drug therapy: Secondary | ICD-10-CM | POA: Insufficient documentation

## 2018-05-20 LAB — URINALYSIS, ROUTINE W REFLEX MICROSCOPIC
BACTERIA UA: NONE SEEN
BILIRUBIN URINE: NEGATIVE
GLUCOSE, UA: NEGATIVE mg/dL
HGB URINE DIPSTICK: NEGATIVE
Ketones, ur: NEGATIVE mg/dL
NITRITE: NEGATIVE
PROTEIN: NEGATIVE mg/dL
Specific Gravity, Urine: 1.015 (ref 1.005–1.030)
pH: 6 (ref 5.0–8.0)

## 2018-05-20 LAB — CBC WITH DIFFERENTIAL/PLATELET
Basophils Absolute: 0.1 10*3/uL (ref 0.0–0.1)
Basophils Relative: 1 %
EOS ABS: 0.4 10*3/uL (ref 0.0–0.7)
EOS PCT: 6 %
HCT: 40.4 % (ref 36.0–46.0)
Hemoglobin: 13 g/dL (ref 12.0–15.0)
LYMPHS ABS: 2.8 10*3/uL (ref 0.7–4.0)
LYMPHS PCT: 36 %
MCH: 28 pg (ref 26.0–34.0)
MCHC: 32.2 g/dL (ref 30.0–36.0)
MCV: 87.1 fL (ref 78.0–100.0)
Monocytes Absolute: 0.6 10*3/uL (ref 0.1–1.0)
Monocytes Relative: 7 %
Neutro Abs: 3.9 10*3/uL (ref 1.7–7.7)
Neutrophils Relative %: 50 %
PLATELETS: 298 10*3/uL (ref 150–400)
RBC: 4.64 MIL/uL (ref 3.87–5.11)
RDW: 13.7 % (ref 11.5–15.5)
WBC: 7.7 10*3/uL (ref 4.0–10.5)

## 2018-05-20 LAB — BASIC METABOLIC PANEL
Anion gap: 7 (ref 5–15)
BUN: 17 mg/dL (ref 6–20)
CHLORIDE: 105 mmol/L (ref 101–111)
CO2: 30 mmol/L (ref 22–32)
CREATININE: 1.19 mg/dL — AB (ref 0.44–1.00)
Calcium: 11.4 mg/dL — ABNORMAL HIGH (ref 8.9–10.3)
GFR calc Af Amer: 55 mL/min — ABNORMAL LOW (ref >60)
GFR, EST NON AFRICAN AMERICAN: 48 mL/min — AB (ref >60)
Glucose, Bld: 113 mg/dL — ABNORMAL HIGH (ref 65–99)
POTASSIUM: 3.5 mmol/L (ref 3.5–5.1)
SODIUM: 142 mmol/L (ref 135–145)

## 2018-05-20 MED ORDER — OXYCODONE-ACETAMINOPHEN 5-325 MG PO TABS
2.0000 | ORAL_TABLET | Freq: Once | ORAL | Status: AC
  Administered 2018-05-20: 2 via ORAL
  Filled 2018-05-20: qty 2

## 2018-05-20 MED ORDER — CEPHALEXIN 250 MG PO CAPS: 250.0000 mg | ORAL_CAPSULE | Freq: Four times a day (QID) | ORAL | 0 refills | Status: DC

## 2018-05-20 MED ORDER — SODIUM CHLORIDE 0.9 % IV BOLUS
1000.0000 mL | Freq: Once | INTRAVENOUS | Status: AC
  Administered 2018-05-20: 1000 mL via INTRAVENOUS

## 2018-05-20 MED ORDER — CEPHALEXIN 500 MG PO CAPS
500.0000 mg | ORAL_CAPSULE | Freq: Once | ORAL | Status: AC
  Administered 2018-05-20: 500 mg via ORAL
  Filled 2018-05-20: qty 1

## 2018-05-20 NOTE — Discharge Instructions (Addendum)
Your evaluated in the emergency department for low back pain and decreased urination.  You probably are a little dehydrated and need to drink more fluids.  Your back pain is probably musculoskeletal and you need to use ibuprofen and Tylenol for pain.  You also have some signs of urine infection and we are prescribing you medication for this.  You should call your doctor for follow-up.  Please return if any worsening symptoms.

## 2018-05-20 NOTE — ED Triage Notes (Addendum)
Pt c/o back pain and stiffness since yesterday. Pt also reports difficulty urinating. Pt denies injury. Pt reports urinary retention since 0400 today.

## 2018-05-20 NOTE — ED Provider Notes (Signed)
[facility] EMERGENCY DEPARTMENT Provider Note   CSN: [id number] Arrival date & time: 05/20/18  1844     History   Chief Complaint Chief Complaint  Patient presents with  . Urinary Retention    HPI [name] is a 62 y.o. female.  She presents today with complaint of low back pain for 2 days.  It is associated with some difficulty urinating.  She states she urinated once yesterday and once today and still has the urge to urinate but difficulty letting down the urine.  There is been no fever no nausea no vomiting no numbness or weakness.  Her back pain is moderate to severe and increased with movement or bending.  It is improved with rest.  She is tried nothing for it.  She works standing but states she does not do any heavy lifting and does not recall any trauma.  She remembers one prior episode of back pain like this and is when she had a kidney infection.  There is been no fever.  The history is provided by the patient.  Back Pain   This is a new problem. The current episode started yesterday. The problem occurs constantly. The problem has not changed since onset.The pain is associated with no known injury. The pain is present in the lumbar spine. The quality of the pain is described as stabbing. The pain does not radiate. The pain is moderate. The symptoms are aggravated by bending and twisting. The pain is the same all the time. Associated symptoms include leg pain (chronic knee pain). Pertinent negatives include no chest pain, no fever, no numbness, no headaches, no abdominal pain, no abdominal swelling, no bowel incontinence, no bladder incontinence, no dysuria, no pelvic pain, no paresthesias, no paresis, no tingling and no weakness. She has tried nothing for the symptoms. The treatment provided no relief.    Past Medical History:  Diagnosis Date  . Anxiety   . Asthma   . Chronic knee pain   . Diverticulitis   . Headache   . Hypertension   . Seasonal allergies   . Vertigo      Patient Active Problem List   Diagnosis Date Noted  . Diverticulitis of colon 09/15/2017  . Diverticulitis 05/24/2017  . AKI (acute kidney injury) ([place]) 05/24/2017  . Hypokalemia 05/24/2017  . Essential hypertension 05/24/2017  . Vertigo 05/24/2017  . CAP (community acquired pneumonia) 05/04/2015  . Sepsis ([place]) 05/02/2015  . Acute respiratory failure ([place]) 05/02/2015    Past Surgical History:  Procedure Laterality Date  . ABDOMINAL HYSTERECTOMY    . ABLATION COLPOCLESIS    . CHOLECYSTECTOMY    . COLONOSCOPY WITH PROPOFOL N/A 10/04/2017   Procedure: COLONOSCOPY WITH PROPOFOL;  Surgeon: Rogene Houston, MD;  [facility];  Service: Endoscopy;  Laterality: N/A;  1:15  . POLYPECTOMY  10/04/2017   Procedure: POLYPECTOMY;  Surgeon: Rogene Houston, MD;  Location: [facility];  Service: Endoscopy;;  sigmoid colon polyp     OB History    Gravida  3   Para  2   Term  2   Preterm      AB  1   Living        SAB  1   TAB      Ectopic      Multiple      Live Births               Home Medications    Prior to  Admission medications   Medication Sig Start Date End Date Taking? Authorizing Provider  albuterol (PROVENTIL HFA;VENTOLIN HFA) 108 (90 Base) MCG/ACT inhaler Inhale 2 puffs into the lungs every 6 (six) hours as needed for wheezing or shortness of breath.     [provider]  ALPRAZolam Duanne Moron) 1 MG tablet Take 2 mg by mouth at bedtime.    [provider]  Multiple Vitamin (MULTIVITAMIN WITH MINERALS) TABS tablet Take 1 tablet by mouth daily.    [provider]  oxyCODONE-acetaminophen (PERCOCET) 10-325 MG tablet Take 1 tablet by mouth 3 (three) times daily as needed for pain.    [provider]  triamcinolone cream (KENALOG) 0.1 % Apply 1 application topically 2 (two) times daily. Apply sparingly to neck rash for up to 10 days. Patient taking differently: Apply 1 application topically 2 (two) times daily  as needed (for rash).  09/23/17   Evalee Jefferson, PA-C  triamterene-hydrochlorothiazide (MAXZIDE) 75-50 MG tablet Take 0.5 tablets by mouth daily. Resume in 1 week Patient taking differently: Take 0.5 tablets by mouth daily.  05/25/17   Kathie Dike, MD  Wheat Dextrin (BENEFIBER DRINK MIX) PACK Take 4 g by mouth at bedtime. Patient taking differently: Take 4 g by mouth once a week.  10/04/17   Rogene Houston, MD    Family History Family History  Problem Relation Age of Onset  . Emphysema Mother   . Stroke Father   . Hypertension Father   . Diabetes Sister   . Hypertension Sister   . Stroke Brother   . Hypertension Brother     Social History Social History   Tobacco Use  . Smoking status: Never Smoker  . Smokeless tobacco: Never Used  Substance Use Topics  . Alcohol use: No  . Drug use: No     Allergies   Iohexol; Latex; Benadryl [diphenhydramine]; and Sulfa antibiotics   Review of Systems Review of Systems  Constitutional: Negative for chills and fever.  HENT: Negative for ear pain and sore throat.   Eyes: Negative for pain and visual disturbance.  Respiratory: Negative for cough and shortness of breath.   Cardiovascular: Negative for chest pain and palpitations.  Gastrointestinal: Negative for abdominal pain, bowel incontinence and vomiting.  Genitourinary: Positive for decreased urine volume. Negative for bladder incontinence, dysuria, hematuria and pelvic pain.  Musculoskeletal: Positive for back pain. Negative for arthralgias.  Skin: Negative for color change and rash.  Neurological: Negative for tingling, seizures, syncope, weakness, numbness, headaches and paresthesias.  All other systems reviewed and are negative.    Physical Exam Updated Vital Signs BP (!) 97/52   Pulse 92   Temp 98 F (36.7 C)   Resp 18   Ht 5' (1.524 m)   Wt 81.6 kg (180 lb)   SpO2 99%   BMI 35.15 kg/m   Physical Exam  Constitutional: She is oriented to person, place, and  time. She appears well-developed and well-nourished. No distress.  HENT:  Head: Normocephalic and atraumatic.  Eyes: Conjunctivae are normal.  Neck: Neck supple.  Cardiovascular: Normal rate and regular rhythm.  No murmur heard. Pulmonary/Chest: Effort normal and breath sounds normal. No respiratory distress.  Abdominal: Soft. There is no tenderness.  Musculoskeletal: She exhibits no edema or deformity.       Cervical back: Normal.       Thoracic back: Normal.       Lumbar back: She exhibits tenderness (paralumbar bilateral), bony tenderness and spasm. She exhibits no laceration.  Neurological:  She is alert and oriented to person, place, and time. She has normal strength. No sensory deficit. GCS eye subscore is 4. GCS verbal subscore is 5. GCS motor subscore is 6.  Skin: Skin is warm and dry.  Psychiatric: She has a normal mood and affect.  Nursing note and vitals reviewed.    ED Treatments / Results  Labs (all labs ordered are listed, but only abnormal results are displayed) Labs Reviewed  URINALYSIS, ROUTINE W REFLEX MICROSCOPIC - Abnormal; Notable for the following components:      Result Value   APPearance HAZY (*)    Leukocytes, UA LARGE (*)    All other components within normal limits  BASIC METABOLIC PANEL - Abnormal; Notable for the following components:   Glucose, Bld 113 (*)    Creatinine, Ser 1.19 (*)    Calcium 11.4 (*)    GFR calc non Af Amer 48 (*)    GFR calc Af Amer 55 (*)    All other components within normal limits  CBC WITH DIFFERENTIAL/PLATELET    EKG None  Radiology No results found.  No orders to display     Procedures Procedures (including critical care time)  Medications Ordered in ED Medications  oxyCODONE-acetaminophen (PERCOCET/ROXICET) 5-325 MG per tablet 2 tablet (2 tablets Oral Given 05/20/18 1922)  sodium chloride 0.9 % bolus 1,000 mL (0 mLs Intravenous Stopped 05/20/18 2105)  cephALEXin (KEFLEX) capsule 500 mg (500 mg Oral Given  05/20/18 2236)     Initial Impression / Assessment and Plan / ED Course  I have reviewed the triage vital signs and the nursing notes.  Pertinent labs & imaging results that were available during my care of the patient were reviewed by me and considered in my medical decision making (see chart for details).  Clinical Course as of May 22 1650  Fri May 20, 2018  1934 Patient was bladder scanned and only had about 50 cc in her.  She states she has been drinking plenty of fluids today.  This is unlikely retention she is probably hypovolemia as much of anything.  Ordered to get her some basic lab work give her some IV fluids.   [MB]  2059 Evaluated patient.  She states her pain is improved after the medications but has still been unable to urinate for [place].  Her lab work was fairly unremarkable of the slight bump in her creatinine.  Awaiting urinalysis but likely will be discharged to follow-up with her primary care doctor.   [MB]    Clinical Course User Index [MB] Hayden Rasmussen, MD     Final Clinical Impressions(s) / ED Diagnoses   Final diagnoses:  Acute bilateral low back pain without sciatica  Dehydration  Lower urinary tract infectious disease    ED Discharge Orders        Ordered    cephALEXin (KEFLEX) 250 MG capsule  4 times daily     05/20/18 2221       Hayden Rasmussen, MD 05/22/18 1652

## 2018-05-31 ENCOUNTER — Emergency Department (HOSPITAL_COMMUNITY): Payer: Self-pay

## 2018-05-31 ENCOUNTER — Encounter (HOSPITAL_COMMUNITY): Payer: Self-pay | Admitting: *Deleted

## 2018-05-31 ENCOUNTER — Emergency Department (HOSPITAL_COMMUNITY)
Admission: EM | Admit: 2018-05-31 | Discharge: 2018-05-31 | Disposition: A | Discharge status: Home / Self Care | Payer: Self-pay | Attending: Emergency Medicine | Admitting: Emergency Medicine

## 2018-05-31 ENCOUNTER — Other Ambulatory Visit: Payer: Self-pay

## 2018-05-31 DIAGNOSIS — W109XXA Fall (on) (from) unspecified stairs and steps, initial encounter: Secondary | ICD-10-CM | POA: Insufficient documentation

## 2018-05-31 DIAGNOSIS — S93401A Sprain of unspecified ligament of right ankle, initial encounter: Secondary | ICD-10-CM | POA: Insufficient documentation

## 2018-05-31 DIAGNOSIS — X501XXA Overexertion from prolonged static or awkward postures, initial encounter: Secondary | ICD-10-CM | POA: Insufficient documentation

## 2018-05-31 DIAGNOSIS — I10 Essential (primary) hypertension: Secondary | ICD-10-CM | POA: Insufficient documentation

## 2018-05-31 DIAGNOSIS — Z79899 Other long term (current) drug therapy: Secondary | ICD-10-CM | POA: Insufficient documentation

## 2018-05-31 DIAGNOSIS — Z23 Encounter for immunization: Secondary | ICD-10-CM | POA: Insufficient documentation

## 2018-05-31 DIAGNOSIS — Y929 Unspecified place or not applicable: Secondary | ICD-10-CM | POA: Insufficient documentation

## 2018-05-31 DIAGNOSIS — S8391XA Sprain of unspecified site of right knee, initial encounter: Secondary | ICD-10-CM | POA: Insufficient documentation

## 2018-05-31 DIAGNOSIS — Y9389 Activity, other specified: Secondary | ICD-10-CM | POA: Insufficient documentation

## 2018-05-31 DIAGNOSIS — J45909 Unspecified asthma, uncomplicated: Secondary | ICD-10-CM | POA: Insufficient documentation

## 2018-05-31 DIAGNOSIS — Y990 Civilian activity done for income or pay: Secondary | ICD-10-CM | POA: Insufficient documentation

## 2018-05-31 DIAGNOSIS — Z91040 Latex allergy status: Secondary | ICD-10-CM | POA: Insufficient documentation

## 2018-05-31 MED ORDER — TETANUS-DIPHTH-ACELL PERTUSSIS 5-2.5-18.5 LF-MCG/0.5 IM SUSP
0.5000 mL | Freq: Once | INTRAMUSCULAR | Status: AC
  Administered 2018-05-31: 0.5 mL via INTRAMUSCULAR
  Filled 2018-05-31: qty 0.5

## 2018-05-31 NOTE — ED Triage Notes (Signed)
Pt c/o pain to right ankle after she fell down stairs today. Pt has abrasion below right knee. Pt ambulatory in triage. Denies LOC.

## 2018-05-31 NOTE — ED Notes (Signed)
Pt returned from xray

## 2018-05-31 NOTE — ED Notes (Signed)
Patient transported to X-ray 

## 2018-05-31 NOTE — Discharge Instructions (Addendum)
Elevate and apply ice packs on and off to your knee and ankle.  Minimal weightbearing for few days.  You may wear the brace in the knee sleeve as needed for support.  Call Dr. Ruthe Mannan office in 1 week to arrange a follow-up appointment if not improving

## 2018-06-02 NOTE — ED Provider Notes (Signed)
[facility] EMERGENCY DEPARTMENT Provider Note   CSN: [id number] Arrival date & time: 05/31/18  1741     History   Chief Complaint Chief Complaint  Patient presents with  . Ankle Pain    HPI [name] is a 62 y.o. female.  HPI  [name] is a 62 y.o. female who presents to the Emergency Department complaining of pain to right ankle secondary to a mechanical fall that occurred prior to arrival.  states that he missed a step and her ankle "rolled" and she scraped her right knee as well.  Pain to ankle with movement and weight bearing.  Able to put some weight on ankle and bend her knee.  Denies bleeding, numbness of the extremity, other injuries and LOC.   Has not taken any pain relievers prior to arrival.    Past Medical History:  Diagnosis Date  . Anxiety   . Asthma   . Chronic knee pain   . Diverticulitis   . Headache   . Hypertension   . Seasonal allergies   . Vertigo     Patient Active Problem List   Diagnosis Date Noted  . Diverticulitis of colon 09/15/2017  . Diverticulitis 05/24/2017  . AKI (acute kidney injury) ([place]) 05/24/2017  . Hypokalemia 05/24/2017  . Essential hypertension 05/24/2017  . Vertigo 05/24/2017  . CAP (community acquired pneumonia) 05/04/2015  . Sepsis ([place]) 05/02/2015  . Acute respiratory failure ([place]) 05/02/2015    Past Surgical History:  Procedure Laterality Date  . ABDOMINAL HYSTERECTOMY    . ABLATION COLPOCLESIS    . CHOLECYSTECTOMY    . COLONOSCOPY WITH PROPOFOL N/A 10/04/2017   Procedure: COLONOSCOPY WITH PROPOFOL;  Surgeon: Rogene Houston, MD;  Location: [facility];  Service: Endoscopy;  Laterality: N/A;  1:15  . POLYPECTOMY  10/04/2017   Procedure: POLYPECTOMY;  Surgeon: Rogene Houston, MD;  Location: [facility] ENDO [facility]ITE;  Service: Endoscopy;;  sigmoid colon polyp     OB History    Gravida  3   Para  2   Term  2   Preterm      AB  1   Living        SAB  1   TAB      Ectopic      Multiple     Live Births               Home Medications    Prior to Admission medications   Medication Sig Start Date End Date Taking? Authorizing Provider  albuterol (PROVENTIL HFA;VENTOLIN HFA) 108 (90 Base) MCG/ACT inhaler Inhale 2 puffs into the lungs every 6 (six) hours as needed for wheezing or shortness of breath.    Yes [provider]  ALPRAZolam Duanne Moron) 1 MG tablet Take 2 mg by mouth at bedtime.   Yes [provider]  cephALEXin (KEFLEX) 250 MG capsule Take 1 capsule (250 mg total) by mouth 4 (four) times daily. 05/20/18  Yes Hayden Rasmussen, MD  fluticasone [facility]) 50 MCG/ACT nasal spray Place 1-2 sprays into both nostrils daily.   Yes [provider]  Multiple Vitamin (MULTIVITAMIN WITH MINERALS) TABS tablet Take 1 tablet by mouth daily.   Yes [provider]  triamcinolone cream (KENALOG) 0.1 % Apply 1 application topically 2 (two) times daily. Apply sparingly to neck rash for up to 10 days. Patient taking differently: Apply 1 application topically 2 (two) times daily as needed (for rash).  09/23/17  Yes Evalee Jefferson,  PA-C  triamterene-hydrochlorothiazide (MAXZIDE) 75-50 MG tablet Take 0.5 tablets by mouth daily. Resume in 1 week Patient taking differently: Take 0.5 tablets by mouth daily.  05/25/17  Yes Kathie Dike, MD  Wheat Dextrin (BENEFIBER DRINK MIX) PACK Take 4 g by mouth at bedtime. Patient taking differently: Take 4 g by mouth daily.  10/04/17  Yes Rehman, Mechele Dawley, MD  oxyCODONE-acetaminophen (PERCOCET) 10-325 MG tablet Take 1 tablet by mouth 3 (three) times daily as needed for pain.    [provider]    Family History Family History  Problem Relation Age of Onset  . Emphysema Mother   . Stroke Father   . Hypertension Father   . Diabetes Sister   . Hypertension Sister   . Stroke Brother   . Hypertension Brother     Social History Social History   Tobacco Use  . Smoking status: Never Smoker  . Smokeless tobacco:  Never Used  Substance Use Topics  . Alcohol use: No  . Drug use: No     Allergies   Iohexol; Latex; Benadryl [diphenhydramine]; and Sulfa antibiotics   Review of Systems Review of Systems  Constitutional: Negative for chills and fever.  Musculoskeletal: Positive for arthralgias (right knee pain and right ankle pain and swelling) and joint swelling. Negative for back pain and neck pain.  Skin: Negative for color change.       abrasion right lower leg  Neurological: Negative for dizziness, syncope, weakness and numbness.  Psychiatric/Behavioral: Negative for confusion.  All other systems reviewed and are negative.    Physical Exam Updated Vital Signs BP 121/63 (BP Location: Right Arm)   Pulse 82   Temp 98.5 F (36.9 C) (Oral)   Resp 20   Ht 5' (1.524 m)   Wt 78.5 kg (173 lb)   SpO2 98%   BMI 33.79 kg/m   Physical Exam  Constitutional: She appears well-developed. No distress.  HENT:  Head: Atraumatic.  Neck: Normal range of motion.  Cardiovascular: Normal rate, regular rhythm and intact distal pulses.  Pulmonary/Chest: Effort normal and breath sounds normal.  Musculoskeletal: She exhibits edema and tenderness. She exhibits no deformity.  ttp of the lateral right ankle and knee  Mild to moderate edema of ankle, full ROM of knee.  No bony deformity.  compartments are soft.   Neurological: She is alert.  Skin: Skin is warm. Capillary refill takes less than 2 seconds.  Mild scrape of the lateral right lower leg.  No edema, or bleeding  Nursing note and vitals reviewed.    ED Treatments / Results  Labs (all labs ordered are listed, but only abnormal results are displayed) Labs Reviewed - No data to display  EKG None  Radiology Dg Ankle Complete Right  Result Date: 05/31/2018 CLINICAL DATA:  Fall down stairs with injury of the right ankle; lateral pain. EXAM: RIGHT ANKLE - COMPLETE 3+ VIEW COMPARISON:  11/16/2017 and MRI from 04/21/2011 FINDINGS: The malleoli  appear intact.  Plafond and talar dome unremarkable. Plantar and Achilles calcaneal spurs, chronic. Chronic os peroneus. Mild dorsal midfoot spurring. No significant tibiotalar joint effusion. IMPRESSION: 1. No appreciable fracture or acute bony findings. 2. Plantar and Achilles calcaneal spurs.  Dorsal midfoot spurring. Electronically Signed   By: Van Clines M.D.   On: 05/31/2018 18:19   Dg Knee Complete 4 Views Right  Result Date: 05/31/2018 CLINICAL DATA:  [address] downstairs and injured right knee. EXAM: RIGHT KNEE - COMPLETE 4+ VIEW COMPARISON:  None. FINDINGS: Moderate to  advanced lateral compartment degenerative changes. I do not see a definite fracture and there is no joint effusion. IMPRESSION: Lateral compartment degenerative changes. No definite acute fracture or joint effusion. Electronically Signed   By: Marijo Sanes M.D.   On: 05/31/2018 19:21     Procedures Procedures (including critical care time)  Medications Ordered in ED Medications  Tdap (BOOSTRIX) injection 0.5 mL (0.5 mLs Intramuscular Given 05/31/18 1911)     Initial Impression / Assessment and Plan / ED Course  I have reviewed the triage vital signs and the nursing notes.  Pertinent labs & imaging results that were available during my care of the patient were reviewed by me and considered in my medical decision making (see chart for details).     Td updated.  Abrasion cleaned and bandaged.   ASO applied and knee sleeve.   Pt agrees to RICE therapy, has cane at home.  Orthopedic f/u if not improving in one week  Final Clinical Impressions(s) / ED Diagnoses   Final diagnoses:  Sprain of right ankle, unspecified ligament, initial encounter  Sprain of right knee, unspecified ligament, initial encounter    ED Discharge Orders    None       Kem Parkinson, PA-C 06/02/18 2236    Francine Graven, DO 06/04/18 2338

## 2018-06-13 ENCOUNTER — Ambulatory Visit (INDEPENDENT_AMBULATORY_CARE_PROVIDER_SITE_OTHER): Payer: Self-pay | Admitting: Orthopedic Surgery

## 2018-06-13 VITALS — BP 138/73 | HR 84 | Ht <= 58 in | Wt 178.0 lb

## 2018-06-13 DIAGNOSIS — M25562 Pain in left knee: Secondary | ICD-10-CM

## 2018-06-13 DIAGNOSIS — M1711 Unilateral primary osteoarthritis, right knee: Secondary | ICD-10-CM

## 2018-06-13 DIAGNOSIS — M25561 Pain in right knee: Secondary | ICD-10-CM

## 2018-06-13 DIAGNOSIS — G8929 Other chronic pain: Secondary | ICD-10-CM

## 2018-06-13 DIAGNOSIS — M25571 Pain in right ankle and joints of right foot: Secondary | ICD-10-CM

## 2018-06-13 NOTE — Progress Notes (Signed)
[name]  06/13/2018  HISTORY SECTION :  Chief Complaint  Patient presents with  . Knee Injury    ER follow up on right knee after fall, DOI 05-31-18.   62 year old female presents for evaluation of new injury right knee and ankle  On June 11 the patient was going out her back door fell down the steps injured the right knee and ankle presented to the ER on June 11 x-rays show arthritis of the right knee negative for acute injury right ankle she does note at that time she had pain and swelling in both areas which have now resolved  She now has acute on chronic pain in the left knee.  She was seen previously for osteoarthritis we recommended surgery but she did not have insurance to cover so we gave her an injection and she did well with the injection until the recent fall.  She now has pain in the left knee and no pain in the right knee or right ankle   Review of Systems  Constitutional: Negative for chills and fever.  Skin: Negative.   Neurological: Negative for tingling.    Past Medical History:  Diagnosis Date  . Anxiety   . Asthma   . Chronic knee pain   . Diverticulitis   . Headache   . Hypertension   . Seasonal allergies   . Vertigo     Allergies  Allergen Reactions  . Iohexol Anaphylaxis       . Latex Other (See Comments)    Causes dark spots on skin, but not pain, or irritation.   . Benadryl [Diphenhydramine] Rash and Other (See Comments)    Liquid. Pt states she can take pills.  . Sulfa Antibiotics Rash and Other (See Comments)    Minor Shaking     Current Outpatient Medications:  .  albuterol (PROVENTIL HFA;VENTOLIN HFA) 108 (90 Base) MCG/ACT inhaler, Inhale 2 puffs into the lungs every 6 (six) hours as needed for wheezing or shortness of breath. , Disp: , Rfl:  .  ALPRAZolam (XANAX) 1 MG tablet, Take 2 mg by mouth at bedtime., Disp: , Rfl:  .  cephALEXin (KEFLEX) 250 MG capsule, Take 1 capsule (250 mg total) by mouth 4 (four) times daily., Disp:  28 capsule, Rfl: 0 .  fluticasone (FLONASE) 50 MCG/ACT nasal spray, Place 1-2 sprays into both nostrils daily., Disp: , Rfl:  .  Multiple Vitamin (MULTIVITAMIN WITH MINERALS) TABS tablet, Take 1 tablet by mouth daily., Disp: , Rfl:  .  oxyCODONE-acetaminophen (PERCOCET) 10-325 MG tablet, Take 1 tablet by mouth 3 (three) times daily as needed for pain., Disp: , Rfl:  .  triamcinolone cream (KENALOG) 0.1 %, Apply 1 application topically 2 (two) times daily. Apply sparingly to neck rash for up to 10 days. (Patient taking differently: Apply 1 application topically 2 (two) times daily as needed (for rash). ), Disp: 30 g, Rfl: 0 .  triamterene-hydrochlorothiazide (MAXZIDE) 75-50 MG tablet, Take 0.5 tablets by mouth daily. Resume in 1 week (Patient taking differently: Take 0.5 tablets by mouth daily. ), Disp: , Rfl:  .  Wheat Dextrin (BENEFIBER DRINK MIX) PACK, Take 4 g by mouth at bedtime. (Patient taking differently: Take 4 g by mouth daily. ), Disp: , Rfl:    PHYSICAL EXAM SECTION: BP 138/73   Pulse 84   Ht 4\' 10"  (1.473 m)   Wt 178 lb (80.7 kg)   BMI 37.20 kg/m   General appearance the patient is normally developed grooming  and hygiene are normal  Oriented x3  Mood pleasant affect normal  Gait mildly abnormal seems to favor the right side  Extremity #1 right knee,  Inspection no swelling or tenderness Range of motion full Stability tests were normal Strength is 5/5 Skin was warm dry and intact without rash lesion or ulceration, knee abrasion Pulse and perfusion normal without edema Normal sensation  Right ankle drawer test negative no tenderness or swelling range of motion normal  Extremity #2 left knee Inspection lateral and medial joint line tenderness mild effusion  range of motion full Stability tests were normal Strength 5 out of 5 to manual muscle test Skin warm dry and intact without rash lesions or ulceration   MEDICAL DECISION SECTION:  Encounter Diagnoses  Name  Primary?  . Primary osteoarthritis of right knee   . Acute pain of right knee   . Acute right ankle pain//sprain   . Chronic pain of left knee Yes    Imaging X-ray of the ankle and knee (right) CLINICAL DATA:  Fall down stairs with injury of the right ankle; lateral pain.   EXAM: RIGHT ANKLE - COMPLETE 3+ VIEW   COMPARISON:  11/16/2017 and MRI from 04/21/2011   FINDINGS: The malleoli appear intact.  Plafond and talar dome unremarkable.   Plantar and Achilles calcaneal spurs, chronic. Chronic os peroneus. Mild dorsal midfoot spurring. No significant tibiotalar joint effusion.   IMPRESSION: 1. No appreciable fracture or acute bony findings. 2. Plantar and Achilles calcaneal spurs.  Dorsal midfoot spurring.     Electronically Signed   By: Van Clines M.D.   On: 05/31/2018 18:19   CLINICAL DATA:  [address] downstairs and injured right knee.   EXAM: RIGHT KNEE - COMPLETE 4+ VIEW   COMPARISON:  None.   FINDINGS: Moderate to advanced lateral compartment degenerative changes. I do not see a definite fracture and there is no joint effusion.   IMPRESSION: Lateral compartment degenerative changes.   No definite acute fracture or joint effusion.     Electronically Signed   By: Marijo Sanes M.D.   On: 05/31/2018 19:21  I interpret the ankle film as 3 views ankle normal and 3 views of the right knee I interpret as osteoarthritis advanced lateral compartment   Plan:  (Rx., Inj., surg., Frx, MRI/CT, XR:2) As the right knee and ankle asymptomatic in the left knee is symptomatic I recommended injection left knee  Procedure note left knee injection verbal consent was obtained to inject left knee joint  Timeout was completed to confirm the site of injection  The medications used were 40 mg of Depo-Medrol and 1% lidocaine 3 cc  Anesthesia was provided by ethyl chloride and the skin was prepped with alcohol.  After cleaning the skin with alcohol a 20-gauge needle was  used to inject the left knee joint. There were no complications. A sterile bandage was applied.

## 2018-06-13 NOTE — Patient Instructions (Signed)

## 2018-07-05 ENCOUNTER — Other Ambulatory Visit (HOSPITAL_COMMUNITY): Payer: Self-pay | Admitting: *Deleted

## 2018-07-05 DIAGNOSIS — Z1231 Encounter for screening mammogram for malignant neoplasm of breast: Secondary | ICD-10-CM

## 2018-07-18 ENCOUNTER — Ambulatory Visit (HOSPITAL_COMMUNITY)
Admission: RE | Admit: 2018-07-18 | Discharge: 2018-07-18 | Disposition: A | Discharge status: Home / Self Care | Payer: PRIVATE HEALTH INSURANCE | Source: Ambulatory Visit | Attending: *Deleted | Admitting: *Deleted

## 2018-07-18 ENCOUNTER — Encounter (HOSPITAL_COMMUNITY): Payer: Self-pay

## 2018-07-18 DIAGNOSIS — Z1231 Encounter for screening mammogram for malignant neoplasm of breast: Secondary | ICD-10-CM | POA: Insufficient documentation

## 2019-01-31 ENCOUNTER — Emergency Department (HOSPITAL_COMMUNITY): Payer: Self-pay

## 2019-01-31 ENCOUNTER — Encounter (HOSPITAL_COMMUNITY): Payer: Self-pay | Admitting: Emergency Medicine

## 2019-01-31 ENCOUNTER — Emergency Department (HOSPITAL_COMMUNITY)
Admission: EM | Admit: 2019-01-31 | Discharge: 2019-01-31 | Disposition: A | Discharge status: Home / Self Care | Payer: Self-pay | Attending: Emergency Medicine | Admitting: Emergency Medicine

## 2019-01-31 ENCOUNTER — Other Ambulatory Visit: Payer: Self-pay

## 2019-01-31 DIAGNOSIS — J101 Influenza due to other identified influenza virus with other respiratory manifestations: Secondary | ICD-10-CM

## 2019-01-31 DIAGNOSIS — F419 Anxiety disorder, unspecified: Secondary | ICD-10-CM | POA: Insufficient documentation

## 2019-01-31 DIAGNOSIS — J09X2 Influenza due to identified novel influenza A virus with other respiratory manifestations: Secondary | ICD-10-CM | POA: Insufficient documentation

## 2019-01-31 DIAGNOSIS — J45909 Unspecified asthma, uncomplicated: Secondary | ICD-10-CM | POA: Insufficient documentation

## 2019-01-31 DIAGNOSIS — I10 Essential (primary) hypertension: Secondary | ICD-10-CM | POA: Insufficient documentation

## 2019-01-31 DIAGNOSIS — Z9049 Acquired absence of other specified parts of digestive tract: Secondary | ICD-10-CM | POA: Insufficient documentation

## 2019-01-31 LAB — URINALYSIS, ROUTINE W REFLEX MICROSCOPIC
Bacteria, UA: NONE SEEN
Bilirubin Urine: NEGATIVE
Glucose, UA: NEGATIVE mg/dL
Hgb urine dipstick: NEGATIVE
KETONES UR: 5 mg/dL — AB
Nitrite: NEGATIVE
Protein, ur: NEGATIVE mg/dL
Specific Gravity, Urine: 1.02 (ref 1.005–1.030)
pH: 5 (ref 5.0–8.0)

## 2019-01-31 LAB — INFLUENZA PANEL BY PCR (TYPE A & B)
Influenza A By PCR: POSITIVE — AB
Influenza B By PCR: NEGATIVE

## 2019-01-31 LAB — BASIC METABOLIC PANEL
Anion gap: 9 (ref 5–15)
BUN: 16 mg/dL (ref 8–23)
CO2: 24 mmol/L (ref 22–32)
Calcium: 10.5 mg/dL — ABNORMAL HIGH (ref 8.9–10.3)
Chloride: 106 mmol/L (ref 98–111)
Creatinine, Ser: 1.14 mg/dL — ABNORMAL HIGH (ref 0.44–1.00)
GFR calc Af Amer: 59 mL/min — ABNORMAL LOW (ref >60)
GFR calc non Af Amer: 51 mL/min — ABNORMAL LOW (ref >60)
GLUCOSE: 95 mg/dL (ref 70–99)
POTASSIUM: 3.4 mmol/L — AB (ref 3.5–5.1)
Sodium: 139 mmol/L (ref 135–145)

## 2019-01-31 LAB — CBC WITH DIFFERENTIAL/PLATELET
ABS IMMATURE GRANULOCYTES: 0.03 10*3/uL (ref 0.00–0.07)
BASOS PCT: 1 %
Basophils Absolute: 0 10*3/uL (ref 0.0–0.1)
EOS ABS: 0 10*3/uL (ref 0.0–0.5)
EOS PCT: 0 %
HCT: 40.2 % (ref 36.0–46.0)
Hemoglobin: 12.6 g/dL (ref 12.0–15.0)
Immature Granulocytes: 0 %
Lymphocytes Relative: 16 %
Lymphs Abs: 1.1 10*3/uL (ref 0.7–4.0)
MCH: 27.5 pg (ref 26.0–34.0)
MCHC: 31.3 g/dL (ref 30.0–36.0)
MCV: 87.6 fL (ref 80.0–100.0)
MONO ABS: 0.9 10*3/uL (ref 0.1–1.0)
MONOS PCT: 13 %
Neutro Abs: 4.8 10*3/uL (ref 1.7–7.7)
Neutrophils Relative %: 70 %
PLATELETS: 262 10*3/uL (ref 150–400)
RBC: 4.59 MIL/uL (ref 3.87–5.11)
RDW: 14.2 % (ref 11.5–15.5)
WBC: 6.9 10*3/uL (ref 4.0–10.5)
nRBC: 0 % (ref 0.0–0.2)

## 2019-01-31 LAB — TROPONIN I: Troponin I: <0.03 ng/mL (ref <0.03)

## 2019-01-31 MED ORDER — OSELTAMIVIR PHOSPHATE 75 MG PO CAPS: 75.0000 mg | ORAL_CAPSULE | Freq: Once | ORAL | Status: DC

## 2019-01-31 MED ORDER — IBUPROFEN 400 MG PO TABS
400.0000 mg | ORAL_TABLET | Freq: Once | ORAL | Status: AC
  Administered 2019-01-31: 400 mg via ORAL
  Filled 2019-01-31: qty 1

## 2019-01-31 MED ORDER — ACETAMINOPHEN 500 MG PO TABS
1000.0000 mg | ORAL_TABLET | Freq: Once | ORAL | Status: DC
  Filled 2019-01-31: qty 2

## 2019-01-31 MED ORDER — OSELTAMIVIR PHOSPHATE 75 MG PO CAPS: 75.0000 mg | ORAL_CAPSULE | Freq: Two times a day (BID) | ORAL | 0 refills | Status: AC

## 2019-01-31 MED ORDER — IPRATROPIUM-ALBUTEROL 0.5-2.5 (3) MG/3ML IN SOLN
3.0000 mL | Freq: Once | RESPIRATORY_TRACT | Status: AC
  Administered 2019-01-31: 3 mL via RESPIRATORY_TRACT
  Filled 2019-01-31: qty 3

## 2019-01-31 NOTE — ED Provider Notes (Signed)
[facility] Emergency Department Provider Note MRN:  [id number]  Arrival date & time: 01/31/19     Chief Complaint   Cough   History of Present Illness   [name] is a 63 y.o. year-old female with a history of hypertension, diverticulitis presenting to the ED with chief complaint of cough.  Patient was diagnosed with pneumonia 2 weeks ago, given antibiotics, had been recovering well.  2 days ago began feeling worse, fever, nasal congestion, mild sore throat, return of dry cough, body aches.  Denies chest pain or shortness of breath.  Feels soreness in the chest when coughing.  Denies abdominal pain, no dysuria.  Otherwise feels well, eating and drinking normally.  No exacerbating relieving factors.  Review of Systems  A complete 10 system review of systems was obtained and all systems are negative except as noted in the HPI and PMH.   Patient's Health History    Past Medical History:  Diagnosis Date  . Anxiety   . Asthma   . Chronic knee pain   . Diverticulitis   . Headache   . Hypertension   . Seasonal allergies   . Vertigo     Past Surgical History:  Procedure Laterality Date  . ABDOMINAL HYSTERECTOMY    . ABLATION COLPOCLESIS    . CHOLECYSTECTOMY    . COLONOSCOPY WITH PROPOFOL N/A 10/04/2017   Procedure: COLONOSCOPY WITH PROPOFOL;  Surgeon: Rogene Houston, MD;  [facility];  Service: Endoscopy;  Laterality: N/A;  1:15  . POLYPECTOMY  10/04/2017   Procedure: POLYPECTOMY;  Surgeon: Rogene Houston, MD;  Location: [facility];  Service: Endoscopy;;  sigmoid colon polyp    Family History  Problem Relation Age of Onset  . Emphysema Mother   . Stroke Father   . Hypertension Father   . Diabetes Sister   . Hypertension Sister   . Stroke Brother   . Hypertension Brother     Social History   Socioeconomic History  . Marital status: Widowed    Spouse name: Not on file  . Number of children: Not on file  . Years of education:  Not on file  . Highest education level: Not on file  Occupational History  . Occupation: Merchant navy officer  Social Needs  . Financial resource strain: Not on file  . Food insecurity:    Worry: Not on file    Inability: Not on file  . Transportation needs:    Medical: Not on file    Non-medical: Not on file  Tobacco Use  . Smoking status: Never Smoker  . Smokeless tobacco: Never Used  Substance and Sexual Activity  . Alcohol use: No  . Drug use: No  . Sexual activity: Not on file  Lifestyle  . Physical activity:    Days per week: Not on file    Minutes per session: Not on file  . Stress: Not on file  Relationships  . Social connections:    Talks on phone: Not on file    Gets together: Not on file    Attends religious service: Not on file    Active member of club or organization: Not on file    Attends meetings of clubs or organizations: Not on file    Relationship status: Not on file  . Intimate partner violence:    Fear of current or ex partner: Not on file    Emotionally abused: Not on file    Physically abused: Not on  file    Forced sexual activity: Not on file  Other Topics Concern  . Not on file  Social History Narrative  . Not on file     Physical Exam  Vital Signs and Nursing Notes reviewed Vitals:   01/31/19 1251 01/31/19 1558  BP: (!) 125/54   Pulse: (!) 113   Resp: (!) 22   Temp: (!) 102.8 F (39.3 C)   SpO2: 96% 95%    CONSTITUTIONAL: Well-appearing, NAD NEURO:  Alert and oriented x 3, no focal deficits EYES:  eyes equal and reactive ENT/NECK:  no LAD, no JVD CARDIO: Tachycardic rate, well-perfused, normal S1 and S2 PULM: Poor air movement, scattered wheezes GI/GU:  normal bowel sounds, non-distended, non-tender MSK/SPINE:  No gross deformities, no edema SKIN:  no rash, atraumatic PSYCH:  Appropriate speech and behavior  Diagnostic and Interventional Summary    EKG Interpretation  Date/Time:  Tuesday January 31 2019 12:55:55  EST Ventricular Rate:  113 PR Interval:  136 QRS Duration: 94 QT Interval:  302 QTC Calculation: 414 R Axis:   -64 Text Interpretation:  Sinus tachycardia Left anterior fascicular block Left ventricular hypertrophy with repolarization abnormality Abnormal ECG Confirmed by Dorie Rank [phone]) on 01/31/2019 1:02:05 PM      Labs Reviewed  BASIC METABOLIC PANEL - Abnormal; Notable for the following components:      Result Value   Potassium 3.4 (*)    Creatinine, Ser 1.14 (*)    Calcium 10.5 (*)    GFR calc non Af Amer 51 (*)    GFR calc Af Amer 59 (*)    All other components within normal limits  URINALYSIS, ROUTINE W REFLEX MICROSCOPIC - Abnormal; Notable for the following components:   APPearance HAZY (*)    Ketones, ur 5 (*)    Leukocytes,Ua SMALL (*)    All other components within normal limits  INFLUENZA PANEL BY PCR (TYPE A & B) - Abnormal; Notable for the following components:   Influenza A By PCR POSITIVE (*)    All other components within normal limits  CBC WITH DIFFERENTIAL/PLATELET  TROPONIN I    DG Chest 2 View  Final Result      Medications  acetaminophen (TYLENOL) tablet 1,000 mg (1,000 mg Oral Refused 01/31/19 1627)  ibuprofen (ADVIL,MOTRIN) tablet 400 mg (400 mg Oral Given 01/31/19 1258)  ipratropium-albuterol (DUONEB) 0.5-2.5 (3) MG/3ML nebulizer solution 3 mL (3 mLs Nebulization Given 01/31/19 1555)     Procedures Critical Care  ED Course and Medical Decision Making  I have reviewed the triage vital signs and the nursing notes.  Pertinent labs & imaging results that were available during my care of the patient were reviewed by me and considered in my medical decision making (see below for details).  Suspect influenza in this 63 year old female with fever, mild tachycardia.  However patient does have a history of pneumonia and sepsis, history of diverticulitis but no abdominal pain or tenderness today.  Patient is very well-appearing, will obtain screening  labs, swab for flu, awaiting urinalysis, poor air movement on exam will provide DuoNeb and reassess.  Lung exam greatly improved, moving good air, no wheezing.  Patient continues to look and feel well, heart rate on my reevaluation in the low 90s.  Tested positive for influenza A.  Strict return precautions, prescription for Tamiflu.  After the discussed management above, the patient was determined to be safe for discharge.  The patient was in agreement with this plan and all questions regarding their  care were answered.  ED return precautions were discussed and the patient will return to the ED with any significant worsening of condition.  Barth Kirks. Sedonia Small, [place] [email or web address]  Final Clinical Impressions(s) / ED Diagnoses     ICD-10-CM   1. Influenza A J10.1     ED Discharge Orders         Ordered    oseltamivir (TAMIFLU) 75 MG capsule  Every 12 hours     01/31/19 1748             Maudie Flakes, MD 01/31/19 1750

## 2019-01-31 NOTE — ED Triage Notes (Signed)
Patient states she was diagnosed with pneumonia 2 weeks ago. Complaining of continuing cough and fever. Also complaining of chest pain with cough and deep breathing.

## 2019-01-31 NOTE — Discharge Instructions (Addendum)
You were evaluated in the Emergency Department and after careful evaluation, we did not find any emergent condition requiring admission or further testing in the hospital.  Your symptoms today seem to be due to the flu.  Take the medication as directed.  Please return to the Emergency Department if you experience any worsening of your condition.  We encourage you to follow up with a primary care provider.  Thank you for allowing [place] to be a part of your care.

## 2019-08-07 ENCOUNTER — Other Ambulatory Visit (HOSPITAL_COMMUNITY): Payer: Self-pay | Admitting: Family Medicine

## 2019-08-07 DIAGNOSIS — Z1231 Encounter for screening mammogram for malignant neoplasm of breast: Secondary | ICD-10-CM

## 2019-08-14 ENCOUNTER — Encounter (HOSPITAL_COMMUNITY): Payer: Self-pay

## 2019-08-14 ENCOUNTER — Ambulatory Visit (HOSPITAL_COMMUNITY)
Admission: RE | Admit: 2019-08-14 | Discharge: 2019-08-14 | Disposition: A | Discharge status: Home / Self Care | Payer: Medicare Other | Source: Ambulatory Visit | Attending: Family Medicine | Admitting: Family Medicine

## 2019-08-14 ENCOUNTER — Other Ambulatory Visit: Payer: Self-pay

## 2019-08-14 DIAGNOSIS — Z1231 Encounter for screening mammogram for malignant neoplasm of breast: Secondary | ICD-10-CM | POA: Insufficient documentation

## 2019-09-12 ENCOUNTER — Other Ambulatory Visit: Payer: Self-pay

## 2019-09-12 DIAGNOSIS — Z20822 Contact with and (suspected) exposure to covid-19: Secondary | ICD-10-CM

## 2019-09-13 LAB — NOVEL CORONAVIRUS, NAA: SARS-CoV-2, NAA: NOT DETECTED

## 2019-09-19 ENCOUNTER — Other Ambulatory Visit: Payer: Self-pay

## 2019-09-19 DIAGNOSIS — Z20822 Contact with and (suspected) exposure to covid-19: Secondary | ICD-10-CM

## 2019-09-20 ENCOUNTER — Telehealth: Payer: Self-pay | Admitting: General Practice

## 2019-09-20 LAB — NOVEL CORONAVIRUS, NAA: SARS-CoV-2, NAA: NOT DETECTED

## 2019-09-20 NOTE — Telephone Encounter (Signed)
Negative COVID results given. Patient results "NOT Detected." Caller expressed understanding. ° °

## 2019-10-03 ENCOUNTER — Other Ambulatory Visit (HOSPITAL_COMMUNITY)
Admission: RE | Admit: 2019-10-03 | Discharge: 2019-10-03 | Disposition: A | Discharge status: Home / Self Care | Payer: Medicare Other | Source: Ambulatory Visit | Attending: Family Medicine | Admitting: Family Medicine

## 2019-10-03 ENCOUNTER — Other Ambulatory Visit: Payer: Self-pay

## 2019-10-03 DIAGNOSIS — R42 Dizziness and giddiness: Secondary | ICD-10-CM | POA: Insufficient documentation

## 2019-10-03 DIAGNOSIS — F17210 Nicotine dependence, cigarettes, uncomplicated: Secondary | ICD-10-CM | POA: Insufficient documentation

## 2019-10-03 DIAGNOSIS — I10 Essential (primary) hypertension: Secondary | ICD-10-CM | POA: Insufficient documentation

## 2019-10-03 LAB — COMPREHENSIVE METABOLIC PANEL
ALT: 37 U/L (ref 0–44)
AST: 39 U/L (ref 15–41)
Albumin: 4.2 g/dL (ref 3.5–5.0)
Alkaline Phosphatase: 109 U/L (ref 38–126)
Anion gap: 11 (ref 5–15)
BUN: 12 mg/dL (ref 8–23)
CO2: 24 mmol/L (ref 22–32)
Calcium: 11 mg/dL — ABNORMAL HIGH (ref 8.9–10.3)
Chloride: 105 mmol/L (ref 98–111)
Creatinine, Ser: 1.08 mg/dL — ABNORMAL HIGH (ref 0.44–1.00)
GFR calc Af Amer: >60 mL/min (ref >60)
GFR calc non Af Amer: 54 mL/min — ABNORMAL LOW (ref >60)
Glucose, Bld: 123 mg/dL — ABNORMAL HIGH (ref 70–99)
Potassium: 3.5 mmol/L (ref 3.5–5.1)
Sodium: 140 mmol/L (ref 135–145)
Total Bilirubin: 0.6 mg/dL (ref 0.3–1.2)
Total Protein: 7.5 g/dL (ref 6.5–8.1)

## 2019-10-03 LAB — LIPID PANEL
Cholesterol: 211 mg/dL — ABNORMAL HIGH (ref 0–200)
HDL: 59 mg/dL (ref >40)
LDL Cholesterol: 135 mg/dL — ABNORMAL HIGH (ref 0–99)
Total CHOL/HDL Ratio: 3.6 RATIO
Triglycerides: 87 mg/dL (ref <150)
VLDL: 17 mg/dL (ref 0–40)

## 2019-10-03 LAB — CBC
HCT: 42.7 % (ref 36.0–46.0)
Hemoglobin: 13.3 g/dL (ref 12.0–15.0)
MCH: 27.9 pg (ref 26.0–34.0)
MCHC: 31.1 g/dL (ref 30.0–36.0)
MCV: 89.7 fL (ref 80.0–100.0)
Platelets: 312 10*3/uL (ref 150–400)
RBC: 4.76 MIL/uL (ref 3.87–5.11)
RDW: 13.4 % (ref 11.5–15.5)
WBC: 5.9 10*3/uL (ref 4.0–10.5)
nRBC: 0 % (ref 0.0–0.2)

## 2019-10-03 LAB — HEMOGLOBIN A1C
Hgb A1c MFr Bld: 5.6 % (ref 4.8–5.6)
Mean Plasma Glucose: 114.02 mg/dL

## 2019-10-24 ENCOUNTER — Other Ambulatory Visit: Payer: Self-pay | Admitting: *Deleted

## 2019-10-24 DIAGNOSIS — Z20822 Contact with and (suspected) exposure to covid-19: Secondary | ICD-10-CM

## 2019-10-26 LAB — NOVEL CORONAVIRUS, NAA: SARS-CoV-2, NAA: NOT DETECTED

## 2019-11-01 ENCOUNTER — Other Ambulatory Visit: Payer: Self-pay

## 2019-11-01 ENCOUNTER — Emergency Department (HOSPITAL_COMMUNITY)
Admission: EM | Admit: 2019-11-01 | Discharge: 2019-11-01 | Disposition: A | Discharge status: Home / Self Care | Payer: Medicare Other | Attending: Emergency Medicine | Admitting: Emergency Medicine

## 2019-11-01 ENCOUNTER — Encounter (HOSPITAL_COMMUNITY): Payer: Self-pay | Admitting: Emergency Medicine

## 2019-11-01 ENCOUNTER — Emergency Department (HOSPITAL_COMMUNITY): Payer: Medicare Other

## 2019-11-01 DIAGNOSIS — R7989 Other specified abnormal findings of blood chemistry: Secondary | ICD-10-CM | POA: Diagnosis present

## 2019-11-01 DIAGNOSIS — Z5321 Procedure and treatment not carried out due to patient leaving prior to being seen by health care provider: Secondary | ICD-10-CM | POA: Insufficient documentation

## 2019-11-01 DIAGNOSIS — Z20822 Contact with and (suspected) exposure to covid-19: Secondary | ICD-10-CM

## 2019-11-01 NOTE — ED Triage Notes (Signed)
Patient reports nonproductive cough, weakness, and generalized body aches that started Saturday night.

## 2019-11-01 NOTE — ED Triage Notes (Signed)
Patient called x 3 for room. No answer. Not present in waiting area.

## 2019-11-02 ENCOUNTER — Emergency Department (HOSPITAL_COMMUNITY)
Admission: EM | Admit: 2019-11-02 | Discharge: 2019-11-02 | Disposition: A | Discharge status: Home / Self Care | Payer: Medicare Other | Attending: Emergency Medicine | Admitting: Emergency Medicine

## 2019-11-02 ENCOUNTER — Other Ambulatory Visit: Payer: Self-pay

## 2019-11-02 ENCOUNTER — Emergency Department (HOSPITAL_COMMUNITY): Payer: Medicare Other

## 2019-11-02 ENCOUNTER — Encounter (HOSPITAL_COMMUNITY): Payer: Self-pay | Admitting: Emergency Medicine

## 2019-11-02 DIAGNOSIS — J45909 Unspecified asthma, uncomplicated: Secondary | ICD-10-CM | POA: Insufficient documentation

## 2019-11-02 DIAGNOSIS — I10 Essential (primary) hypertension: Secondary | ICD-10-CM | POA: Insufficient documentation

## 2019-11-02 DIAGNOSIS — R52 Pain, unspecified: Secondary | ICD-10-CM | POA: Insufficient documentation

## 2019-11-02 DIAGNOSIS — B349 Viral infection, unspecified: Secondary | ICD-10-CM | POA: Insufficient documentation

## 2019-11-02 DIAGNOSIS — Z79899 Other long term (current) drug therapy: Secondary | ICD-10-CM | POA: Insufficient documentation

## 2019-11-02 LAB — TSH: TSH: 2.075 u[IU]/mL (ref 0.350–4.500)

## 2019-11-02 LAB — CBC WITH DIFFERENTIAL/PLATELET
Abs Immature Granulocytes: 0.01 10*3/uL (ref 0.00–0.07)
Basophils Absolute: 0.1 10*3/uL (ref 0.0–0.1)
Basophils Relative: 1 %
Eosinophils Absolute: 0.3 10*3/uL (ref 0.0–0.5)
Eosinophils Relative: 6 %
HCT: 40.4 % (ref 36.0–46.0)
Hemoglobin: 12.6 g/dL (ref 12.0–15.0)
Immature Granulocytes: 0 %
Lymphocytes Relative: 31 %
Lymphs Abs: 1.5 10*3/uL (ref 0.7–4.0)
MCH: 27.9 pg (ref 26.0–34.0)
MCHC: 31.2 g/dL (ref 30.0–36.0)
MCV: 89.6 fL (ref 80.0–100.0)
Monocytes Absolute: 1 10*3/uL (ref 0.1–1.0)
Monocytes Relative: 21 %
Neutro Abs: 1.9 10*3/uL (ref 1.7–7.7)
Neutrophils Relative %: 41 %
Platelets: 256 10*3/uL (ref 150–400)
RBC: 4.51 MIL/uL (ref 3.87–5.11)
RDW: 13.2 % (ref 11.5–15.5)
WBC: 4.7 10*3/uL (ref 4.0–10.5)
nRBC: 0 % (ref 0.0–0.2)

## 2019-11-02 LAB — BASIC METABOLIC PANEL
Anion gap: 9 (ref 5–15)
BUN: 12 mg/dL (ref 8–23)
CO2: 25 mmol/L (ref 22–32)
Calcium: 10.8 mg/dL — ABNORMAL HIGH (ref 8.9–10.3)
Chloride: 106 mmol/L (ref 98–111)
Creatinine, Ser: 0.89 mg/dL (ref 0.44–1.00)
GFR calc Af Amer: >60 mL/min (ref >60)
GFR calc non Af Amer: >60 mL/min (ref >60)
Glucose, Bld: 101 mg/dL — ABNORMAL HIGH (ref 70–99)
Potassium: 3.6 mmol/L (ref 3.5–5.1)
Sodium: 140 mmol/L (ref 135–145)

## 2019-11-02 LAB — URINALYSIS, ROUTINE W REFLEX MICROSCOPIC
Bacteria, UA: NONE SEEN
Bilirubin Urine: NEGATIVE
Glucose, UA: NEGATIVE mg/dL
Hgb urine dipstick: NEGATIVE
Ketones, ur: NEGATIVE mg/dL
Nitrite: NEGATIVE
Protein, ur: NEGATIVE mg/dL
Specific Gravity, Urine: 1.021 (ref 1.005–1.030)
pH: 5 (ref 5.0–8.0)

## 2019-11-02 NOTE — ED Provider Notes (Signed)
[facility] EMERGENCY DEPARTMENT Provider Note   CSN: [id number] Arrival date & time: 11/02/19  [id number]     History   Chief Complaint Chief Complaint  Patient presents with  . Generalized Body Aches    HPI [name] is a 63 y.o. female.     HPI   [name] is a 63 y.o. female with past medical history of anxiety, asthma, and hypertension  presents to the Emergency Department complaining of generalized body aches and weakness with intermittent cough.  Symptoms have been present for several days.  She also reports some intermittent dizziness, but states this is her baseline.  She states the cough has been mostly nonproductive and not associated with fever.  She reports having vomiting and diarrhea 2 days ago, but states that has since resolved.  She was seen yesterday by her PCP and advised to come to the emergency room for further evaluation and blood work.  She states that she had a negative Covid test on 10/24/2019 and denies any known new exposures.  She denies any shortness of breath, wheezing, chest pain, headache, neck pain or stiffness, abdominal pain, and continued vomiting or diarrhea.    Past Medical History:  Diagnosis Date  . Anxiety   . Asthma   . Chronic knee pain   . Diverticulitis   . Headache   . Hypertension   . Seasonal allergies   . Vertigo     Patient Active Problem List   Diagnosis Date Noted  . Diverticulitis of colon 09/15/2017  . Diverticulitis 05/24/2017  . AKI (acute kidney injury) ([place]) 05/24/2017  . Hypokalemia 05/24/2017  . Essential hypertension 05/24/2017  . Vertigo 05/24/2017  . CAP (community acquired pneumonia) 05/04/2015  . Sepsis ([place]) 05/02/2015  . Acute respiratory failure ([place]) 05/02/2015    Past Surgical History:  Procedure Laterality Date  . ABDOMINAL HYSTERECTOMY    . ABLATION COLPOCLESIS    . CHOLECYSTECTOMY    . COLONOSCOPY WITH PROPOFOL N/A 10/04/2017   Procedure: COLONOSCOPY WITH PROPOFOL;  Surgeon:  Rogene Houston, MD;  [facility];  Service: Endoscopy;  Laterality: N/A;  1:15  . POLYPECTOMY  10/04/2017   Procedure: POLYPECTOMY;  Surgeon: Rogene Houston, MD;  Location: [facility];  Service: Endoscopy;;  sigmoid colon polyp     OB History    Gravida  3   Para  2   Term  2   Preterm      AB  1   Living        SAB  1   TAB      Ectopic      Multiple      Live Births               Home Medications    Prior to Admission medications   Medication Sig Start Date End Date Taking? Authorizing Provider  albuterol (PROVENTIL HFA;VENTOLIN HFA) 108 (90 Base) MCG/ACT inhaler Inhale 2 puffs into the lungs every 6 (six) hours as needed for wheezing or shortness of breath.     [provider]  ALPRAZolam Duanne Moron) 1 MG tablet Take 2 mg by mouth at bedtime.    [provider]  fluticasone (FLONASE) 50 MCG/ACT nasal spray Place 1-2 sprays into both nostrils daily as needed for allergies or rhinitis.     [provider]  Multiple Vitamin (MULTIVITAMIN WITH MINERALS) TABS tablet Take 1 tablet by mouth daily.    [provider]  oxyCODONE-acetaminophen (  PERCOCET) 10-325 MG tablet Take 1 tablet by mouth 3 (three) times daily as needed for pain.    [provider]  triamcinolone cream (KENALOG) 0.1 % Apply 1 application topically 2 (two) times daily. Apply sparingly to neck rash for up to 10 days. Patient taking differently: Apply 1 application topically 2 (two) times daily as needed (for rash).  09/23/17   Evalee Jefferson, PA-C  triamterene-hydrochlorothiazide (MAXZIDE) 75-50 MG tablet Take 0.5 tablets by mouth daily. Resume in 1 week Patient taking differently: Take 0.5 tablets by mouth daily.  05/25/17   Kathie Dike, MD  Wheat Dextrin (BENEFIBER DRINK MIX) PACK Take 4 g by mouth at bedtime. Patient taking differently: Take 4 g by mouth daily.  10/04/17   Rogene Houston, MD    Family History Family History  Problem  Relation Age of Onset  . Emphysema Mother   . Stroke Father   . Hypertension Father   . Diabetes Sister   . Hypertension Sister   . Stroke Brother   . Hypertension Brother     Social History Social History   Tobacco Use  . Smoking status: Never Smoker  . Smokeless tobacco: Never Used  Substance Use Topics  . Alcohol use: No  . Drug use: No     Allergies   Iohexol, Latex, Benadryl [diphenhydramine], and Sulfa antibiotics   Review of Systems Review of Systems  Constitutional: Positive for fatigue. Negative for appetite change, chills and fever.  HENT: Positive for congestion. Negative for ear pain, rhinorrhea, sore throat and trouble swallowing.   Respiratory: Positive for cough. Negative for chest tightness, shortness of breath and wheezing.   Cardiovascular: Negative for chest pain.  Gastrointestinal: Negative for abdominal pain, diarrhea, nausea and vomiting.  Genitourinary: Negative for decreased urine volume and dysuria.  Musculoskeletal: Positive for myalgias (Generalized body aches). Negative for arthralgias.  Skin: Negative for rash.  Neurological: Negative for dizziness, weakness (Generalized weakness) and numbness.  Hematological: Negative for adenopathy.     Physical Exam Updated Vital Signs BP 119/63   Pulse 75   Temp 98.6 F (37 C) (Oral)   Resp 18   Wt 73 kg   SpO2 97%   BMI 32.52 kg/m   Physical Exam Vitals signs and nursing note reviewed.  Constitutional:      Appearance: Normal appearance. She is not ill-appearing or toxic-appearing.  HENT:     Mouth/Throat:     Mouth: Mucous membranes are moist.     Pharynx: Oropharynx is clear. No oropharyngeal exudate or posterior oropharyngeal erythema.  Eyes:     Extraocular Movements: Extraocular movements intact.     Conjunctiva/sclera: Conjunctivae normal.     Pupils: Pupils are equal, round, and reactive to light.  Neck:     Musculoskeletal: Normal range of motion.  Cardiovascular:     Rate  and Rhythm: Normal rate and regular rhythm.     Pulses: Normal pulses.  Pulmonary:     Effort: Pulmonary effort is normal. No respiratory distress.     Breath sounds: Normal breath sounds. No wheezing, rhonchi or rales.  Abdominal:     General: There is no distension.     Palpations: Abdomen is soft.     Tenderness: There is no abdominal tenderness. There is no guarding.  Musculoskeletal: Normal range of motion.     Right lower leg: No edema.     Left lower leg: No edema.  Skin:    General: Skin is warm.     Capillary  Refill: Capillary refill takes less than 2 seconds.     Findings: No rash.  Neurological:     General: No focal deficit present.     Mental Status: She is alert.      ED Treatments / Results  Labs (all labs ordered are listed, but only abnormal results are displayed) Labs Reviewed  BASIC METABOLIC PANEL - Abnormal; Notable for the following components:      Result Value   Glucose, Bld 101 (*)    Calcium 10.8 (*)    All other components within normal limits  URINALYSIS, ROUTINE W REFLEX MICROSCOPIC - Abnormal; Notable for the following components:   APPearance HAZY (*)    Leukocytes,Ua TRACE (*)    All other components within normal limits  CBC WITH DIFFERENTIAL/PLATELET  TSH    EKG None  Radiology Dg Chest Portable 1 View  Result Date: 11/02/2019 CLINICAL DATA:  Body aches, intermittent cough and dizziness for several days. EXAM: PORTABLE CHEST 1 VIEW COMPARISON:  01/31/2019 FINDINGS: Cardiac silhouette is normal in size. No mediastinal or hilar masses. No evidence of adenopathy. Clear lungs.  No pleural effusion or pneumothorax. Skeletal structures are grossly intact. IMPRESSION: No active disease. Electronically Signed   By: Lajean Manes M.D.   On: 11/02/2019 09:46    Procedures Procedures (including critical care time)  Medications Ordered in ED Medications - No data to display   Initial Impression / Assessment and Plan / ED Course  I have  reviewed the triage vital signs and the nursing notes.  Pertinent labs & imaging results that were available during my care of the patient were reviewed by me and considered in my medical decision making (see chart for details).        Pt also seen by Dr. Gilford Raid  1110  Work up today is reassuring.  Vitals reviewed.  She is afebrile without hypoxia or tachycardia.  TSH is still pending, but pt is requesting d/c home.  I feel this is appropriate.  Symptoms are felt to be viral and her COVID test from yesterday is also pending.  She agrees to close f/u with Dr. Karie Kirks.  Return precautions were also discussed.  Final Clinical Impressions(s) / ED Diagnoses   Final diagnoses:  Body aches  Viral illness    ED Discharge Orders    None       Kem Parkinson, PA-C 11/02/19 1124    Isla Pence, MD 11/02/19 Annawan, Julie, MD 11/02/19 [phone]

## 2019-11-02 NOTE — ED Notes (Signed)
Sars not collected, Pt states she had one yesterday at the [facility].

## 2019-11-02 NOTE — ED Triage Notes (Signed)
Pt c/o of body aches, intermittent cough and dizziness.

## 2019-11-02 NOTE — ED Notes (Signed)
Pt made aware we need a urine specimen. Pt denied having to urinate at this time

## 2019-11-02 NOTE — Discharge Instructions (Addendum)
Your thyroid test is pending.  You can contact Dr. Vickey Sages office tomorrow or Monday to arrange follow-up and discuss your results.  Your Covid test from yesterday is also still pending.  You may also review your results in [facility].

## 2019-11-03 LAB — NOVEL CORONAVIRUS, NAA: SARS-CoV-2, NAA: NOT DETECTED

## 2019-12-05 ENCOUNTER — Other Ambulatory Visit: Payer: Self-pay

## 2020-02-26 ENCOUNTER — Telehealth: Payer: Self-pay | Admitting: Orthopaedic Surgery

## 2020-02-26 NOTE — Telephone Encounter (Signed)
Patient called today wanting to come in today.  She said that she could not get a shoe on. I told her that Dr. Luna Glasgow was not in the office today.  I told her that we had her for 8:00 tomorrow morning.  She said if she couldn't wait until tomorrow she would go ahead and go to ER.  I told her that was fine otherwise we would see her tomorrow morning.

## 2020-02-27 ENCOUNTER — Encounter: Payer: Self-pay | Admitting: Orthopaedic Surgery

## 2020-02-27 ENCOUNTER — Ambulatory Visit (INDEPENDENT_AMBULATORY_CARE_PROVIDER_SITE_OTHER): Payer: Medicare Other | Admitting: Orthopaedic Surgery

## 2020-02-27 ENCOUNTER — Ambulatory Visit: Payer: Medicare Other

## 2020-02-27 ENCOUNTER — Other Ambulatory Visit: Payer: Self-pay

## 2020-02-27 VITALS — BP 146/88 | HR 73 | Temp 97.1°F | Ht 60.0 in | Wt 175.1 lb

## 2020-02-27 DIAGNOSIS — M79672 Pain in left foot: Secondary | ICD-10-CM | POA: Diagnosis not present

## 2020-02-27 DIAGNOSIS — G8929 Other chronic pain: Secondary | ICD-10-CM

## 2020-02-27 NOTE — Patient Instructions (Signed)
Pull your foot up with a towel several times in the morning before you get out of bed Also do this several times throughout the afternoon  You can also stretch at the kitchen counter lean forward into the counter as if doing a push up and it will stretch your calves and into your heel  If you feel the stretch in the calf you are doing it correctly  Use Aspercreme, Biofreeze or Voltaren gel over the counter 2-3 times daily make sure you rub it in well each time you use it.   Also use ice on the foot in the evenings   Aleve take one twice a day this will also help, but the stretches will help you more

## 2020-02-27 NOTE — Progress Notes (Signed)
Subjective:    Patient ID: [name], female    DOB: January 06, 1956, 64 y.o.   MRN: [id number]  HPI She has had heel pain for the last six months or so getting slowly worse.  It hurts more when she first stands.  She has no trauma, no redness, no swelling.  She has tried Tylenol and ice with no help.  She has to stand and walk at work.    Review of Systems  Constitutional: Positive for activity change.  Respiratory: Positive for shortness of breath.   Musculoskeletal: Positive for arthralgias and gait problem.  Neurological: Positive for headaches.  All other systems reviewed and are negative.  For Review of Systems, all other systems reviewed and are negative.  The following is a summary of the past history medically, past history surgically, known current medicines, social history and family history.  This information is gathered electronically by the computer from prior information and documentation.  I review this each visit and have found including this information at this point in the chart is beneficial and informative.   Past Medical History:  Diagnosis Date  . Anxiety   . Asthma   . Chronic knee pain   . Diverticulitis   . Headache   . Hypertension   . Seasonal allergies   . Vertigo     Past Surgical History:  Procedure Laterality Date  . ABDOMINAL HYSTERECTOMY    . ABLATION COLPOCLESIS    . CHOLECYSTECTOMY    . COLONOSCOPY WITH PROPOFOL N/A 10/04/2017   Procedure: COLONOSCOPY WITH PROPOFOL;  Surgeon: Rogene Houston, MD;  [facility];  Service: Endoscopy;  Laterality: N/A;  1:15  . POLYPECTOMY  10/04/2017   Procedure: POLYPECTOMY;  Surgeon: Rogene Houston, MD;  [facility];  Service: Endoscopy;;  sigmoid colon polyp    Current Outpatient Medications on File Prior to Visit  Medication Sig Dispense Refill  . albuterol (PROVENTIL HFA;VENTOLIN HFA) 108 (90 Base) MCG/ACT inhaler Inhale 2 puffs into the lungs every 6 (six) hours as needed  for wheezing or shortness of breath.     . ALPRAZolam (XANAX) 1 MG tablet Take 2 mg by mouth at bedtime.    . ciprofloxacin (CIPRO) 500 MG tablet Take 500 mg by mouth 2 (two) times daily as needed (diverticulitis).     . fluorometholone (FML) 0.1 % ophthalmic suspension Place 1 drop into both eyes 4 (four) times daily.     . fluticasone (FLONASE) 50 MCG/ACT nasal spray Place 1-2 sprays into both nostrils daily as needed for allergies or rhinitis.     . Multiple Vitamin (MULTIVITAMIN WITH MINERALS) TABS tablet Take 1 tablet by mouth daily.    Marland Kitchen oxyCODONE-acetaminophen (PERCOCET) 10-325 MG tablet Take 1 tablet by mouth 3 (three) times daily as needed for pain.    Marland Kitchen triamcinolone cream (KENALOG) 0.1 % Apply 1 application topically 2 (two) times daily. Apply sparingly to neck rash for up to 10 days. (Patient taking differently: Apply 1 application topically 2 (two) times daily as needed (for rash). ) 30 g 0  . triamterene-hydrochlorothiazide (MAXZIDE) 75-50 MG tablet Take 0.5 tablets by mouth daily. Resume in 1 week (Patient taking differently: Take 0.5 tablets by mouth daily. )    . Wheat Dextrin (BENEFIBER DRINK MIX) PACK Take 4 g by mouth at bedtime. (Patient taking differently: Take 4 g by mouth daily. )    . Oxycodone HCl 10 MG TABS Take 10 mg by mouth 3 (three)  times daily as needed (pain).      No current facility-administered medications on file prior to visit.    Social History   Socioeconomic History  . Marital status: Widowed    Spouse name: Not on file  . Number of children: Not on file  . Years of education: Not on file  . Highest education level: Not on file  Occupational History  . Occupation: Merchant [employer] officer  Tobacco Use  . Smoking status: Never Smoker  . Smokeless tobacco: Never Used  Substance and Sexual Activity  . Alcohol use: No  . Drug use: No  . Sexual activity: Not on file  Other Topics Concern  . Not on file  Social History Narrative  . Not on file   Social  Determinants of Health   Financial Resource Strain:   . Difficulty of Paying Living Expenses: Not on file  Food Insecurity:   . Worried About Charity fundraiser in the Last Year: Not on file  . Ran Out of Food in the Last Year: Not on file  Transportation Needs:   . Lack of Transportation (Medical): Not on file  . Lack of Transportation (Non-Medical): Not on file  Physical Activity:   . Days of Exercise per Week: Not on file  . Minutes of Exercise per Session: Not on file  Stress:   . Feeling of Stress : Not on file  Social Connections:   . Frequency of Communication with Friends and Family: Not on file  . Frequency of Social Gatherings with Friends and Family: Not on file  . Attends Religious Services: Not on file  . Active Member of Clubs or Organizations: Not on file  . Attends Archivist Meetings: Not on file  . Marital Status: Not on file  Intimate Partner Violence:   . Fear of Current or Ex-Partner: Not on file  . Emotionally Abused: Not on file  . Physically Abused: Not on file  . Sexually Abused: Not on file    Family History  Problem Relation Age of Onset  . Emphysema Mother   . Stroke Father   . Hypertension Father   . Diabetes Sister   . Hypertension Sister   . Stroke Brother   . Hypertension Brother     BP (!) 146/88   Pulse 73   Temp (!) 97.1 F (36.2 C)   Ht 5' (1.524 m)   Wt 175 lb 2 oz (79.4 kg)   BMI 34.20 kg/m   Body mass index is 34.2 kg/m.      Objective:   Physical Exam Vitals and nursing note reviewed.  Constitutional:      Appearance: She is well-developed.  HENT:     Head: Normocephalic and atraumatic.  Eyes:     Conjunctiva/sclera: Conjunctivae normal.     Pupils: Pupils are equal, round, and reactive to light.  Cardiovascular:     Rate and Rhythm: Normal rate and regular rhythm.  Pulmonary:     Effort: Pulmonary effort is normal.  Abdominal:     Palpations: Abdomen is soft.  Musculoskeletal:     Cervical back:  Normal range of motion and neck supple.       Feet:  Skin:    General: Skin is warm and dry.  Neurological:     Mental Status: She is alert and oriented to person, place, and time.     Cranial Nerves: No cranial nerve deficit.     Motor: No abnormal muscle tone.  Coordination: Coordination normal.     Deep Tendon Reflexes: Reflexes are normal and symmetric. Reflexes normal.  Psychiatric:        Behavior: Behavior normal.        Thought Content: Thought content normal.        Judgment: Judgment normal.    X-rays were done of the left foot, reported separately.       Assessment & Plan:   Encounter Diagnoses  Name Primary?  . Pain in left foot Yes  . Chronic heel pain, left    I have explained stretching exercises to her.  She is to do this often during the day and first thing in the morning.  I have told her to take Aleve one bid pc.  I have told her about ice, Aspercreme, BioFreeze and shoe inserts.  Return in one month.  Call if any problem.  Precautions discussed.   Electronically Signed Sanjuana Kava, MD 3/9/20219:02 AM

## 2020-03-06 ENCOUNTER — Telehealth: Payer: Self-pay | Admitting: Orthopaedic Surgery

## 2020-03-06 NOTE — Telephone Encounter (Signed)
Patient called and said she has to walk on concrete at work and that her foot is still hurting and it swells.  She said she cant put ice on it while at work but that she does ice it when she gets home.  She has been doing everything that she has been told to do but doesn't know what else can be done.  She wants to know if you have any other suggestions for her other than getting an injection. She isnt to keen on that notion.  Please advise  Thanks

## 2020-03-07 ENCOUNTER — Encounter: Payer: Self-pay | Admitting: Orthopaedic Surgery

## 2020-03-07 NOTE — Telephone Encounter (Signed)
Ice.  Can give note to stay out of work.  Or, injection.

## 2020-03-12 ENCOUNTER — Other Ambulatory Visit: Payer: Self-pay

## 2020-03-12 ENCOUNTER — Encounter: Payer: Self-pay | Admitting: Orthopaedic Surgery

## 2020-03-12 ENCOUNTER — Ambulatory Visit: Payer: Medicare Other | Admitting: Orthopaedic Surgery

## 2020-03-12 VITALS — BP 142/90 | HR 77 | Temp 98.1°F | Ht 60.0 in | Wt 175.0 lb

## 2020-03-12 DIAGNOSIS — G8929 Other chronic pain: Secondary | ICD-10-CM | POA: Diagnosis not present

## 2020-03-12 DIAGNOSIS — M79672 Pain in left foot: Secondary | ICD-10-CM

## 2020-03-12 NOTE — Telephone Encounter (Signed)
Keep at April 6.  If she cannot go back then, let us know for a new note.

## 2020-03-12 NOTE — Progress Notes (Signed)
My heel still hurts  She has plantar heel pain.  NV intact.  She has been doing her stretching exercises.  Procedure note: After permission for the patient and prep of the left heel, I injected the plantar heel with 1 % xylocaine and 1 cc DepoMedrol 40 by sterile technique tolerated well.  Encounter Diagnosis  Name Primary?  . Chronic heel pain, left Yes    Return in one month.  Call if any problem.  Precautions discussed.   Electronically Signed Sanjuana Kava, MD 3/23/20219:30 AM

## 2020-03-12 NOTE — Telephone Encounter (Signed)
Called patient to notify. Voiced understanding. °

## 2020-03-12 NOTE — Telephone Encounter (Signed)
Completed on 03/07/20, as noted. At time of visit today, 03/12/20, patient is asking, while at [place],  if she is to stay out of work past the current work note date of 4/6//21, or is she to remain out till date of next appointment, 04/09/20?

## 2020-03-14 ENCOUNTER — Telehealth: Payer: Self-pay

## 2020-03-14 NOTE — Telephone Encounter (Signed)
Patient called stating that she got an injection in her heel on Tuesday and she still has no relief from the pain. I asked her was she doing what Dr. Luna Glasgow had told her to do after he gave her the injection. She said she had been doing what he told her but it still hurts. I asked her if she could take Aleve and she stated she could so I suggested for her to try Aleve twice a day after eating so it doesn't mess her stomach up and to ice it some over the weekend.  I told her if she was still hurting on Monday to call the office.

## 2020-03-26 ENCOUNTER — Ambulatory Visit: Payer: Medicare Other | Admitting: Orthopaedic Surgery

## 2020-03-26 ENCOUNTER — Encounter: Payer: Self-pay | Admitting: Orthopaedic Surgery

## 2020-03-26 ENCOUNTER — Other Ambulatory Visit: Payer: Self-pay

## 2020-03-26 VITALS — BP 136/76 | HR 70 | Ht 60.0 in | Wt 175.0 lb

## 2020-03-26 DIAGNOSIS — M79672 Pain in left foot: Secondary | ICD-10-CM

## 2020-03-26 DIAGNOSIS — G8929 Other chronic pain: Secondary | ICD-10-CM

## 2020-03-26 NOTE — Patient Instructions (Signed)
Remain out of work until appointment with podiatrist.

## 2020-03-26 NOTE — Progress Notes (Signed)
Patient [id number] Rogue [name], female DOB:05-23-1956, 64 y.o. [id number]  Chief Complaint  Patient presents with  . Foot Pain    left heel     HPI  [name] is a 64 y.o. female who has continued pain of the left heel.  I injected the area last visit and she has less pain but still cannot wear regular shoes.  She wants to return to work.  I will have her seen by podiatry.  She is agreeable to this.   Body mass index is 34.18 kg/m.  ROS  Review of Systems  Constitutional: Positive for activity change.  Respiratory: Positive for shortness of breath.   Musculoskeletal: Positive for arthralgias and gait problem.  Neurological: Positive for headaches.  All other systems reviewed and are negative.   All other systems reviewed and are negative.  The following is a summary of the past history medically, past history surgically, known current medicines, social history and family history.  This information is gathered electronically by the computer from prior information and documentation.  I review this each visit and have found including this information at this point in the chart is beneficial and informative.    Past Medical History:  Diagnosis Date  . Anxiety   . Asthma   . Chronic knee pain   . Diverticulitis   . Headache   . Hypertension   . Seasonal allergies   . Vertigo     Past Surgical History:  Procedure Laterality Date  . ABDOMINAL HYSTERECTOMY    . ABLATION COLPOCLESIS    . CHOLECYSTECTOMY    . COLONOSCOPY WITH PROPOFOL N/A 10/04/2017   Procedure: COLONOSCOPY WITH PROPOFOL;  Surgeon: Rogene Houston, MD;  Location: [facility];  Service: Endoscopy;  Laterality: N/A;  1:15  . POLYPECTOMY  10/04/2017   Procedure: POLYPECTOMY;  Surgeon: Rogene Houston, MD;  Location: [facility];  Service: Endoscopy;;  sigmoid colon polyp    Family History  Problem Relation Age of Onset  . Emphysema Mother   . Stroke Father   . Hypertension Father   . Diabetes  Sister   . Hypertension Sister   . Stroke Brother   . Hypertension Brother     Social History Social History   Tobacco Use  . Smoking status: Never Smoker  . Smokeless tobacco: Never Used  Substance Use Topics  . Alcohol use: No  . Drug use: No    Allergies  Allergen Reactions  . Iohexol Anaphylaxis       . Latex Other (See Comments)    Causes dark spots on skin, but not pain, or irritation.   . Benadryl [Diphenhydramine] Rash and Other (See Comments)    Liquid. Pt states she can take pills.  . Sulfa Antibiotics Rash and Other (See Comments)    Minor Shaking    Current Outpatient Medications  Medication Sig Dispense Refill  . albuterol (PROVENTIL HFA;VENTOLIN HFA) 108 (90 Base) MCG/ACT inhaler Inhale 2 puffs into the lungs every 6 (six) hours as needed for wheezing or shortness of breath.     . ALPRAZolam (XANAX) 1 MG tablet Take 2 mg by mouth at bedtime.    . ciprofloxacin (CIPRO) 500 MG tablet Take 500 mg by mouth 2 (two) times daily as needed (diverticulitis).     . fluorometholone (FML) 0.1 % ophthalmic suspension Place 1 drop into both eyes 4 (four) times daily.     . fluticasone (FLONASE) 50 MCG/ACT nasal spray Place 1-2 sprays into both  nostrils daily as needed for allergies or rhinitis.     . Multiple Vitamin (MULTIVITAMIN WITH MINERALS) TABS tablet Take 1 tablet by mouth daily.    . Oxycodone HCl 10 MG TABS Take 10 mg by mouth 3 (three) times daily as needed (pain).     Marland Kitchen oxyCODONE-acetaminophen (PERCOCET) 10-325 MG tablet Take 1 tablet by mouth 3 (three) times daily as needed for pain.    Marland Kitchen triamcinolone cream (KENALOG) 0.1 % Apply 1 application topically 2 (two) times daily. Apply sparingly to neck rash for up to 10 days. (Patient taking differently: Apply 1 application topically 2 (two) times daily as needed (for rash). ) 30 g 0  . triamterene-hydrochlorothiazide (MAXZIDE) 75-50 MG tablet Take 0.5 tablets by mouth daily. Resume in 1 week (Patient taking  differently: Take 0.5 tablets by mouth daily. )    . Wheat Dextrin (BENEFIBER DRINK MIX) PACK Take 4 g by mouth at bedtime. (Patient taking differently: Take 4 g by mouth daily. )     No current facility-administered medications for this visit.     Physical Exam  Blood pressure 136/76, pulse 70, height 5' (1.524 m), weight 175 lb (79.4 kg).  Constitutional: overall normal hygiene, normal nutrition, well developed, normal grooming, normal body habitus. Assistive device:none  Musculoskeletal: gait and station Limp left, muscle tone and strength are normal, no tremors or atrophy is present.  .  Neurological: coordination overall normal.  Deep tendon reflex/nerve stretch intact.  Sensation normal.  Cranial nerves II-XII intact.   Skin:   Normal overall no scars, lesions, ulcers or rashes. No psoriasis.  Psychiatric: Alert and oriented x 3.  Recent memory intact, remote memory unclear.  Normal mood and affect. Well groomed.  Good eye contact.  Cardiovascular: overall no swelling, no varicosities, no edema bilaterally, normal temperatures of the legs and arms, no clubbing, cyanosis and good capillary refill.  Lymphatic: palpation is normal.  Her left plantar heel is tender,no redness, no swelling.  Limp left.  All other systems reviewed and are negative   The patient has been educated about the nature of the problem(s) and counseled on treatment options.  The patient appeared to understand what I have discussed and is in agreement with it.  Encounter Diagnosis  Name Primary?  . Chronic heel pain, left Yes    PLAN Call if any problems.  Precautions discussed.  Continue current medications.   Return to clinic to podiatry.   Electronically Signed Sanjuana Kava, MD 4/6/202111:05 AM

## 2020-03-27 ENCOUNTER — Telehealth: Payer: Self-pay | Admitting: Orthopaedic Surgery

## 2020-03-27 NOTE — Telephone Encounter (Signed)
[name] called this afternoon and stated that her foot was feeling much better.  She wants a note to return to work.  Is it okay to write this for her?

## 2020-03-28 NOTE — Telephone Encounter (Signed)
Yes.  Give note

## 2020-04-02 ENCOUNTER — Encounter: Payer: Self-pay | Admitting: Orthopaedic Surgery

## 2020-04-09 ENCOUNTER — Encounter: Payer: Self-pay | Admitting: Orthopaedic Surgery

## 2020-04-09 ENCOUNTER — Ambulatory Visit: Payer: Medicare Other | Admitting: Orthopaedic Surgery

## 2020-04-09 ENCOUNTER — Other Ambulatory Visit: Payer: Self-pay

## 2020-04-09 VITALS — Ht 60.0 in | Wt 176.0 lb

## 2020-04-09 DIAGNOSIS — M79672 Pain in left foot: Secondary | ICD-10-CM | POA: Diagnosis not present

## 2020-04-09 DIAGNOSIS — G8929 Other chronic pain: Secondary | ICD-10-CM

## 2020-04-09 MED ORDER — PREDNISONE 5 MG (21) PO TBPK: ORAL_TABLET | ORAL | 0 refills | Status: DC

## 2020-04-09 NOTE — Patient Instructions (Signed)
Out of work note for the rest of the week. Return to work on Monday 04/15/20

## 2020-04-09 NOTE — Progress Notes (Signed)
Patient [id number] [name], female DOB:11-13-1956, 64 y.o. [id number]  Chief Complaint  Patient presents with  . Foot Pain    Left heel    HPI  [name] is a 64 y.o. female who has chronic left heel pain.  She was to go to podiatry but there was a scheduling mix-up.  She is here today with continued pain.  She has appointment with podiatry on May 10.  She has heel pain left plantar and at Achilles insertion.  She has no new trauma. She has no change in shoe wear.  She has no redness or swelling.   Body mass index is 34.37 kg/m.  ROS  Review of Systems  Constitutional: Positive for activity change.  Respiratory: Positive for shortness of breath.   Musculoskeletal: Positive for arthralgias and gait problem.  Neurological: Positive for headaches.  All other systems reviewed and are negative.   All other systems reviewed and are negative.  The following is a summary of the past history medically, past history surgically, known current medicines, social history and family history.  This information is gathered electronically by the computer from prior information and documentation.  I review this each visit and have found including this information at this point in the chart is beneficial and informative.    Past Medical History:  Diagnosis Date  . Anxiety   . Asthma   . Chronic knee pain   . Diverticulitis   . Headache   . Hypertension   . Seasonal allergies   . Vertigo     Past Surgical History:  Procedure Laterality Date  . ABDOMINAL HYSTERECTOMY    . ABLATION COLPOCLESIS    . CHOLECYSTECTOMY    . COLONOSCOPY WITH PROPOFOL N/A 10/04/2017   Procedure: COLONOSCOPY WITH PROPOFOL;  Surgeon: Rogene Houston, MD;  Location: [facility];  Service: Endoscopy;  Laterality: N/A;  1:15  . POLYPECTOMY  10/04/2017   Procedure: POLYPECTOMY;  Surgeon: Rogene Houston, MD;  Location: [facility];  Service: Endoscopy;;  sigmoid colon polyp    Family History   Problem Relation Age of Onset  . Emphysema Mother   . Stroke Father   . Hypertension Father   . Diabetes Sister   . Hypertension Sister   . Stroke Brother   . Hypertension Brother     Social History Social History   Tobacco Use  . Smoking status: Never Smoker  . Smokeless tobacco: Never Used  Substance Use Topics  . Alcohol use: No  . Drug use: No    Allergies  Allergen Reactions  . Iohexol Anaphylaxis       . Latex Other (See Comments)    Causes dark spots on skin, but not pain, or irritation.   . Benadryl [Diphenhydramine] Rash and Other (See Comments)    Liquid. Pt states she can take pills.  . Sulfa Antibiotics Rash and Other (See Comments)    Minor Shaking    Current Outpatient Medications  Medication Sig Dispense Refill  . albuterol (PROVENTIL HFA;VENTOLIN HFA) 108 (90 Base) MCG/ACT inhaler Inhale 2 puffs into the lungs every 6 (six) hours as needed for wheezing or shortness of breath.     . ALPRAZolam (XANAX) 1 MG tablet Take 2 mg by mouth at bedtime.    . ciprofloxacin (CIPRO) 500 MG tablet Take 500 mg by mouth 2 (two) times daily as needed (diverticulitis).     . fluorometholone (FML) 0.1 % ophthalmic suspension Place 1 drop into both eyes 4 (  four) times daily.     . fluticasone (FLONASE) 50 MCG/ACT nasal spray Place 1-2 sprays into both nostrils daily as needed for allergies or rhinitis.     . Multiple Vitamin (MULTIVITAMIN WITH MINERALS) TABS tablet Take 1 tablet by mouth daily.    . Oxycodone HCl 10 MG TABS Take 10 mg by mouth 3 (three) times daily as needed (pain).     Marland Kitchen oxyCODONE-acetaminophen (PERCOCET) 10-325 MG tablet Take 1 tablet by mouth 3 (three) times daily as needed for pain.    Marland Kitchen triamcinolone cream (KENALOG) 0.1 % Apply 1 application topically 2 (two) times daily. Apply sparingly to neck rash for up to 10 days. (Patient taking differently: Apply 1 application topically 2 (two) times daily as needed (for rash). ) 30 g 0  .  triamterene-hydrochlorothiazide (MAXZIDE) 75-50 MG tablet Take 0.5 tablets by mouth daily. Resume in 1 week (Patient taking differently: Take 0.5 tablets by mouth daily. )    . Wheat Dextrin (BENEFIBER DRINK MIX) PACK Take 4 g by mouth at bedtime. (Patient taking differently: Take 4 g by mouth daily. )    . predniSONE (STERAPRED UNI-PAK 21 TAB) 5 MG (21) TBPK tablet Take 6 pills first day; 5 pills second day; 4 pills third day; 3 pills fourth day; 2 pills next day and 1 pill last day. 21 tablet 0   No current facility-administered medications for this visit.     Physical Exam  Height 5' (1.524 m), weight 176 lb (79.8 kg).  Constitutional: overall normal hygiene, normal nutrition, well developed, normal grooming, normal body habitus. Assistive device:none  Musculoskeletal: gait and station Limp left, muscle tone and strength are normal, no tremors or atrophy is present.  .  Neurological: coordination overall normal.  Deep tendon reflex/nerve stretch intact.  Sensation normal.  Cranial nerves II-XII intact.   Skin:   Normal overall no scars, lesions, ulcers or rashes. No psoriasis.  Psychiatric: Alert and oriented x 3.  Recent memory intact, remote memory unclear.  Normal mood and affect. Well groomed.  Good eye contact.  Cardiovascular: overall no swelling, no varicosities, no edema bilaterally, normal temperatures of the legs and arms, no clubbing, cyanosis and good capillary refill.  Lymphatic: palpation is normal.  Left heel is diffusely tender. ROM is full.  Limp left.  All other systems reviewed and are negative   The patient has been educated about the nature of the problem(s) and counseled on treatment options.  The patient appeared to understand what I have discussed and is in agreement with it.  Encounter Diagnosis  Name Primary?  . Chronic heel pain, left Yes    PLAN Call if any problems.  Precautions discussed.  Continue current medications.   Return to clinic to  podiatry.    I have called in prednisone dose pack.  Electronically Signed Sanjuana Kava, MD 4/20/20219:23 AM

## 2020-04-16 ENCOUNTER — Telehealth: Payer: Self-pay | Admitting: Orthopaedic Surgery

## 2020-04-16 NOTE — Telephone Encounter (Signed)
Patient states she has taken all the medication and the knot on her heel is still hurting her.  She said she has taken all the Prednisone.  She says she has stayed off the foot, used the crutches, elevated but still has pain.  She said the podiatrist appointment is not until May 10th.  She just doesn't know what else to do.  She does say that she is to return to work tomorrow because she really needs to work but she's unsure if she is going to be able to walk that far to get into the building.    Do you have any suggestions for her until she goes on the 10th?

## 2020-04-16 NOTE — Telephone Encounter (Signed)
I can give note to be out of work until she sees the foot doctor.  I can call in pain medicine.  Let me know.

## 2020-04-23 ENCOUNTER — Ambulatory Visit: Payer: Medicare Other | Admitting: Orthopaedic Surgery

## 2020-04-29 ENCOUNTER — Encounter: Payer: Self-pay | Admitting: Podiatry

## 2020-04-29 ENCOUNTER — Other Ambulatory Visit: Payer: Self-pay | Admitting: Podiatry

## 2020-04-29 ENCOUNTER — Ambulatory Visit: Payer: Medicare Other | Admitting: Podiatry

## 2020-04-29 ENCOUNTER — Other Ambulatory Visit: Payer: Self-pay

## 2020-04-29 ENCOUNTER — Ambulatory Visit (INDEPENDENT_AMBULATORY_CARE_PROVIDER_SITE_OTHER): Payer: Medicare Other

## 2020-04-29 VITALS — Temp 98.0°F

## 2020-04-29 DIAGNOSIS — M722 Plantar fascial fibromatosis: Secondary | ICD-10-CM

## 2020-04-29 DIAGNOSIS — M79672 Pain in left foot: Secondary | ICD-10-CM

## 2020-04-29 DIAGNOSIS — R52 Pain, unspecified: Secondary | ICD-10-CM

## 2020-04-29 NOTE — Patient Instructions (Signed)

## 2020-05-02 NOTE — Progress Notes (Signed)
Subjective:   Patient ID: [name], female   DOB: 64 y.o.   MRN: [id number]   HPI Patient presents stating she has had a painful heel left has been making it difficult to walk and she did have one steroid injection which only gave her temporary relief and also has had a boot that she has worn.  States is been present for around 6 months and patient does not smoke likes to be active   Review of Systems  All other systems reviewed and are negative.       Objective:  Physical Exam Vitals and nursing note reviewed.  Constitutional:      Appearance: She is well-developed.  Pulmonary:     Effort: Pulmonary effort is normal.  Musculoskeletal:        General: Normal range of motion.  Skin:    General: Skin is warm.  Neurological:     Mental Status: She is alert.     Neurovascular status was found to be intact muscle strength was adequate range of motion within normal limits.  Patient is found to have exquisite discomfort plantar aspect left heel at the insertional point tendon into the calcaneus with inflammation fluid of the tendon itself at the insertional point    Assessment:  Acute plantar fasciitis left with inflammation fluid buildup     Plan:  H&P x-ray reviewed.  I discussed the nature of this inflammatory condition and at this time I did do sterile prep and injected the plantar fascia at insertion 3 mg Dexasone Kenalog 5 mg Xylocaine applied fascial brace for work and continue boot usage when not working and will be seen back in 3 weeks.  Patient also will do physical therapy and utilize shoe gear modifications  X-ray at this time indicated spur formation but no indication stress fracture or advanced arthritis

## 2020-05-27 ENCOUNTER — Encounter: Payer: Self-pay | Admitting: Podiatry

## 2020-05-27 ENCOUNTER — Other Ambulatory Visit: Payer: Self-pay

## 2020-05-27 ENCOUNTER — Ambulatory Visit: Payer: Medicare Other | Admitting: Podiatry

## 2020-05-27 VITALS — Temp 98.0°F

## 2020-05-27 DIAGNOSIS — M722 Plantar fascial fibromatosis: Secondary | ICD-10-CM | POA: Diagnosis not present

## 2020-05-27 NOTE — Progress Notes (Signed)
Subjective:   Patient ID: [name], female   DOB: 64 y.o.   MRN: [id number]   HPI Patient presents stating her heel is some better but now it seems to hurt more on the bottom outside of the heel versus the inside.  States that it is improved but there is an area that still tender   ROS      Objective:  Physical Exam  Neurovascular status intact with inflammation pain of the plantar lateral aspect of the left heel at the insertional point calcaneus with the medial side mildly tender but improved     Assessment:  Improvement of plantar fasciitis left     Plan:  H&P condition reviewed and went ahead did a sterile prep on the lateral side and injected the fascia 3 mg Kenalog 5 mg Xylocaine applied dressing and advised on supportive shoes.  Reappoint to recheck

## 2020-07-08 ENCOUNTER — Other Ambulatory Visit (HOSPITAL_COMMUNITY): Payer: Self-pay | Admitting: Family Medicine

## 2020-07-08 DIAGNOSIS — Z1231 Encounter for screening mammogram for malignant neoplasm of breast: Secondary | ICD-10-CM

## 2020-08-15 ENCOUNTER — Emergency Department (HOSPITAL_COMMUNITY): Payer: Medicare Other

## 2020-08-15 ENCOUNTER — Other Ambulatory Visit: Payer: Self-pay

## 2020-08-15 ENCOUNTER — Encounter (HOSPITAL_COMMUNITY): Payer: Self-pay | Admitting: *Deleted

## 2020-08-15 ENCOUNTER — Emergency Department (HOSPITAL_COMMUNITY)
Admission: EM | Admit: 2020-08-15 | Discharge: 2020-08-16 | Disposition: A | Discharge status: Home / Self Care | Payer: Medicare Other | Attending: Emergency Medicine | Admitting: Emergency Medicine

## 2020-08-15 DIAGNOSIS — J45909 Unspecified asthma, uncomplicated: Secondary | ICD-10-CM | POA: Insufficient documentation

## 2020-08-15 DIAGNOSIS — Z91040 Latex allergy status: Secondary | ICD-10-CM | POA: Insufficient documentation

## 2020-08-15 DIAGNOSIS — Z7951 Long term (current) use of inhaled steroids: Secondary | ICD-10-CM | POA: Diagnosis not present

## 2020-08-15 DIAGNOSIS — K5732 Diverticulitis of large intestine without perforation or abscess without bleeding: Secondary | ICD-10-CM | POA: Diagnosis not present

## 2020-08-15 DIAGNOSIS — I10 Essential (primary) hypertension: Secondary | ICD-10-CM | POA: Diagnosis not present

## 2020-08-15 DIAGNOSIS — Z79899 Other long term (current) drug therapy: Secondary | ICD-10-CM | POA: Diagnosis not present

## 2020-08-15 DIAGNOSIS — R109 Unspecified abdominal pain: Secondary | ICD-10-CM | POA: Diagnosis present

## 2020-08-15 DIAGNOSIS — K5792 Diverticulitis of intestine, part unspecified, without perforation or abscess without bleeding: Secondary | ICD-10-CM

## 2020-08-15 LAB — CBC
HCT: 41 % (ref 36.0–46.0)
Hemoglobin: 12.7 g/dL (ref 12.0–15.0)
MCH: 28.1 pg (ref 26.0–34.0)
MCHC: 31 g/dL (ref 30.0–36.0)
MCV: 90.7 fL (ref 80.0–100.0)
Platelets: 303 10*3/uL (ref 150–400)
RBC: 4.52 MIL/uL (ref 3.87–5.11)
RDW: 13.5 % (ref 11.5–15.5)
WBC: 9.4 10*3/uL (ref 4.0–10.5)
nRBC: 0 % (ref 0.0–0.2)

## 2020-08-15 LAB — URINALYSIS, ROUTINE W REFLEX MICROSCOPIC
Bilirubin Urine: NEGATIVE
Glucose, UA: NEGATIVE mg/dL
Hgb urine dipstick: NEGATIVE
Ketones, ur: NEGATIVE mg/dL
Leukocytes,Ua: NEGATIVE
Nitrite: NEGATIVE
Protein, ur: NEGATIVE mg/dL
Specific Gravity, Urine: 1.013 (ref 1.005–1.030)
pH: 8 (ref 5.0–8.0)

## 2020-08-15 LAB — CBG MONITORING, ED: Glucose-Capillary: 106 mg/dL — ABNORMAL HIGH (ref 70–99)

## 2020-08-15 LAB — COMPREHENSIVE METABOLIC PANEL
ALT: 21 U/L (ref 0–44)
AST: 26 U/L (ref 15–41)
Albumin: 4.2 g/dL (ref 3.5–5.0)
Alkaline Phosphatase: 145 U/L — ABNORMAL HIGH (ref 38–126)
Anion gap: 10 (ref 5–15)
BUN: 15 mg/dL (ref 8–23)
CO2: 26 mmol/L (ref 22–32)
Calcium: 10.6 mg/dL — ABNORMAL HIGH (ref 8.9–10.3)
Chloride: 103 mmol/L (ref 98–111)
Creatinine, Ser: 0.89 mg/dL (ref 0.44–1.00)
GFR calc Af Amer: >60 mL/min (ref >60)
GFR calc non Af Amer: >60 mL/min (ref >60)
Glucose, Bld: 108 mg/dL — ABNORMAL HIGH (ref 70–99)
Potassium: 3.6 mmol/L (ref 3.5–5.1)
Sodium: 139 mmol/L (ref 135–145)
Total Bilirubin: 0.4 mg/dL (ref 0.3–1.2)
Total Protein: 7.5 g/dL (ref 6.5–8.1)

## 2020-08-15 LAB — LIPASE, BLOOD: Lipase: 52 U/L — ABNORMAL HIGH (ref 11–51)

## 2020-08-15 LAB — LACTIC ACID, PLASMA: Lactic Acid, Venous: 1.2 mmol/L (ref 0.5–1.9)

## 2020-08-15 MED ORDER — ONDANSETRON HCL 4 MG/2ML IJ SOLN
4.0000 mg | Freq: Once | INTRAMUSCULAR | Status: AC
  Administered 2020-08-15: 4 mg via INTRAVENOUS
  Filled 2020-08-15: qty 2

## 2020-08-15 MED ORDER — MORPHINE SULFATE (PF) 4 MG/ML IV SOLN
4.0000 mg | Freq: Once | INTRAVENOUS | Status: AC
  Administered 2020-08-15: 4 mg via INTRAVENOUS
  Filled 2020-08-15: qty 1

## 2020-08-15 MED ORDER — METRONIDAZOLE 500 MG PO TABS: 500.0000 mg | ORAL_TABLET | Freq: Three times a day (TID) | ORAL | 0 refills | Status: DC

## 2020-08-15 MED ORDER — CIPROFLOXACIN IN D5W 400 MG/200ML IV SOLN
400.0000 mg | Freq: Once | INTRAVENOUS | Status: AC
  Administered 2020-08-15: 400 mg via INTRAVENOUS
  Filled 2020-08-15: qty 200

## 2020-08-15 MED ORDER — DOCUSATE SODIUM 100 MG PO CAPS: 100.0000 mg | ORAL_CAPSULE | Freq: Two times a day (BID) | ORAL | 0 refills | Status: DC

## 2020-08-15 MED ORDER — METRONIDAZOLE IN NACL 5-0.79 MG/ML-% IV SOLN
500.0000 mg | Freq: Once | INTRAVENOUS | Status: AC
  Administered 2020-08-15: 500 mg via INTRAVENOUS
  Filled 2020-08-15: qty 100

## 2020-08-15 MED ORDER — FENTANYL CITRATE (PF) 100 MCG/2ML IJ SOLN
75.0000 ug | Freq: Once | INTRAMUSCULAR | Status: AC
  Administered 2020-08-15: 75 ug via INTRAVENOUS
  Filled 2020-08-15: qty 2

## 2020-08-15 MED ORDER — CIPROFLOXACIN HCL 500 MG PO TABS: 500.0000 mg | ORAL_TABLET | Freq: Two times a day (BID) | ORAL | 0 refills | Status: AC

## 2020-08-15 MED ORDER — SODIUM CHLORIDE 0.9 % IV BOLUS
500.0000 mL | Freq: Once | INTRAVENOUS | Status: AC
  Administered 2020-08-15: 500 mL via INTRAVENOUS

## 2020-08-15 NOTE — Discharge Instructions (Signed)
Take your next dose of antibiotics tomorrow morning as soon as you get them filled.  Make sure you are drinking plenty of fluids.  As discussed you should avoid any heavy foods for the next several days, maintain a clear liquid diet such as broth, Jell-O, applesauce.  It is important for you to avoid getting constipated while this infection is resolving.  You have been prescribed some Colace to help you with this.

## 2020-08-15 NOTE — ED Notes (Signed)
Patient states she is a little bit dizzy, stomach is hurting, and shaky "cold"

## 2020-08-15 NOTE — ED Triage Notes (Signed)
Pt c/o sharp abd pain to lower abd pain since last week.  Pain intense on Sunday.  + mild diarrhea per pt. Denies nausea or emesis.

## 2020-08-15 NOTE — ED Provider Notes (Signed)
[facility] EMERGENCY DEPARTMENT Provider Note   CSN: [id number] Arrival date & time: 08/15/20  1026     History Chief Complaint  Patient presents with  . Abdominal Pain    [name] is a 64 y.o. female with a history of hypertension, prior history of diverticulitis, surgical history significant for abdominal hysterectomy and cholecystectomy presenting with abdominal pain.  She endorses a 1 week history of lower abdominal pain which started in her suprapubic region which radiates to the left lower quadrant.  Pain is sometimes sharp, sometimes cramping and has become more severe over the past 3 days.  She denies nausea or vomiting, she has had some cold chills but denies any fever.  She has had diarrhea with small amounts of blood associated with stools, stating 5-6 diarrheal stools daily.  She denies back pain, she does have abdominal distention.  No dysuria or hematuria present.  She has had no medications for symptoms prior to arrival.  He also denies chest pain, no shortness of breath at baseline, but states when her abdominal pain gets severe she has difficulty taking a deep breath.  The history is provided by the patient.       Past Medical History:  Diagnosis Date  . Anxiety   . Asthma   . Chronic knee pain   . Diverticulitis   . Headache   . Hypertension   . Seasonal allergies   . Vertigo     Patient Active Problem List   Diagnosis Date Noted  . Diverticulitis of colon 09/15/2017  . Diverticulitis 05/24/2017  . AKI (acute kidney injury) ([place]) 05/24/2017  . Hypokalemia 05/24/2017  . Essential hypertension 05/24/2017  . Vertigo 05/24/2017  . CAP (community acquired pneumonia) 05/04/2015  . Sepsis ([place]) 05/02/2015  . Acute respiratory failure ([place]) 05/02/2015    Past Surgical History:  Procedure Laterality Date  . ABDOMINAL HYSTERECTOMY    . ABLATION COLPOCLESIS    . CHOLECYSTECTOMY    . COLONOSCOPY WITH PROPOFOL N/A 10/04/2017   Procedure: COLONOSCOPY  WITH PROPOFOL;  Surgeon: Rogene Houston, MD;  Location: [facility];  Service: Endoscopy;  Laterality: N/A;  1:15  . POLYPECTOMY  10/04/2017   Procedure: POLYPECTOMY;  Surgeon: Rogene Houston, MD;  Location: [facility];  Service: Endoscopy;;  sigmoid colon polyp     OB History    Gravida  3   Para  2   Term  2   Preterm      AB  1   Living        SAB  1   TAB      Ectopic      Multiple      Live Births              Family History  Problem Relation Age of Onset  . Emphysema Mother   . Stroke Father   . Hypertension Father   . Diabetes Sister   . Hypertension Sister   . Stroke Brother   . Hypertension Brother     Social History   Tobacco Use  . Smoking status: Never Smoker  . Smokeless tobacco: Never Used  Vaping Use  . Vaping Use: Never used  Substance Use Topics  . Alcohol use: No  . Drug use: No    Home Medications Prior to Admission medications   Medication Sig Start Date End Date Taking? Authorizing Provider  albuterol (PROVENTIL HFA;VENTOLIN HFA) 108 (90 Base) MCG/ACT inhaler Inhale 2 puffs into the  lungs every 6 (six) hours as needed for wheezing or shortness of breath.     [provider]  ALPRAZolam Duanne Moron) 1 MG tablet Take 2 mg by mouth at bedtime.    [provider]  ciprofloxacin (CIPRO) 500 MG tablet Take 1 tablet (500 mg total) by mouth every 12 (twelve) hours for 10 days. 08/15/20 08/25/20  Evalee Jefferson, PA-C  docusate sodium (COLACE) 100 MG capsule Take 1 capsule (100 mg total) by mouth every 12 (twelve) hours. 08/15/20   Evalee Jefferson, PA-C  fluorometholone (FML) 0.1 % ophthalmic suspension Place 1 drop into both eyes 4 (four) times daily.  08/07/19   [provider]  fluticasone (FLONASE) 50 MCG/ACT nasal spray Place 1-2 sprays into both nostrils daily as needed for allergies or rhinitis.     [provider]  metroNIDAZOLE (FLAGYL) 500 MG tablet Take 1 tablet (500 mg total) by mouth 3 (three) times  daily. 08/15/20   Evalee Jefferson, PA-C  Multiple Vitamin (MULTIVITAMIN WITH MINERALS) TABS tablet Take 1 tablet by mouth daily.    [provider]  oxyCODONE-acetaminophen (PERCOCET) 10-325 MG tablet Take 1 tablet by mouth 3 (three) times daily as needed for pain.    [provider]  triamcinolone cream (KENALOG) 0.1 % Apply 1 application topically 2 (two) times daily. Apply sparingly to neck rash for up to 10 days. Patient taking differently: Apply 1 application topically 2 (two) times daily as needed (for rash).  09/23/17   Evalee Jefferson, PA-C  triamterene-hydrochlorothiazide (MAXZIDE) 75-50 MG tablet Take 0.5 tablets by mouth daily. Resume in 1 week Patient taking differently: Take 0.5 tablets by mouth daily.  05/25/17   Kathie Dike, MD  Wheat Dextrin (BENEFIBER DRINK MIX) PACK Take 4 g by mouth at bedtime. Patient taking differently: Take 4 g by mouth daily.  10/04/17   Rogene Houston, MD    Allergies    Iohexol, Latex, Benadryl [diphenhydramine], and Sulfa antibiotics  Review of Systems   Review of Systems  Constitutional: Positive for appetite change and chills. Negative for fever.  HENT: Negative for congestion and sore throat.   Eyes: Negative.   Respiratory: Negative for chest tightness and shortness of breath.   Cardiovascular: Negative for chest pain.  Gastrointestinal: Positive for abdominal pain, blood in stool and diarrhea. Negative for nausea and vomiting.  Genitourinary: Negative.   Musculoskeletal: Negative for arthralgias, joint swelling and neck pain.  Skin: Negative.  Negative for rash and wound.  Neurological: Negative for dizziness, weakness, light-headedness, numbness and headaches.  Psychiatric/Behavioral: Negative.     Physical Exam Updated Vital Signs BP (!) 158/70 (BP Location: Right Arm)   Pulse (!) 105   Temp 99.7 F (37.6 C) (Oral)   Resp 20   Ht 4\' 11"  (1.499 m)   Wt 70.8 kg   SpO2 97%   BMI 31.51 kg/m   Physical Exam Vitals and  nursing note reviewed.  Constitutional:      Appearance: She is well-developed.  HENT:     Head: Normocephalic and atraumatic.  Eyes:     Conjunctiva/sclera: Conjunctivae normal.  Cardiovascular:     Rate and Rhythm: Normal rate and regular rhythm.     Heart sounds: Normal heart sounds.  Pulmonary:     Effort: Pulmonary effort is normal.     Breath sounds: Normal breath sounds. No wheezing.  Abdominal:     General: Bowel sounds are normal.     Palpations: Abdomen is soft.     Tenderness:  There is abdominal tenderness in the suprapubic area and left lower quadrant. There is guarding. There is no rebound.     Hernia: No hernia is present.  Musculoskeletal:        General: Normal range of motion.     Cervical back: Normal range of motion.  Skin:    General: Skin is warm and dry.  Neurological:     General: No focal deficit present.     Mental Status: She is alert.     ED Results / Procedures / Treatments   Labs (all labs ordered are listed, but only abnormal results are displayed) Labs Reviewed  LIPASE, BLOOD - Abnormal; Notable for the following components:      Result Value   Lipase 52 (*)    All other components within normal limits  COMPREHENSIVE METABOLIC PANEL - Abnormal; Notable for the following components:   Glucose, Bld 108 (*)    Calcium 10.6 (*)    Alkaline Phosphatase 145 (*)    All other components within normal limits  URINALYSIS, ROUTINE W REFLEX MICROSCOPIC - Abnormal; Notable for the following components:   APPearance HAZY (*)    All other components within normal limits  CBG MONITORING, ED - Abnormal; Notable for the following components:   Glucose-Capillary 106 (*)    All other components within normal limits  CULTURE, BLOOD (ROUTINE X 2)  CULTURE, BLOOD (ROUTINE X 2)  CBC  LACTIC ACID, PLASMA    EKG None  Radiology CT ABDOMEN PELVIS WO CONTRAST  Result Date: 08/15/2020 CLINICAL DATA:  Abdominal pain. Lower abdominal pain since last week.  Diarrhea. Diverticulitis suspected. EXAM: CT ABDOMEN AND PELVIS WITHOUT CONTRAST TECHNIQUE: Multidetector CT imaging of the abdomen and pelvis was performed following the standard protocol without IV contrast. COMPARISON:  Noncontrast CT 05/24/2017 FINDINGS: Lower chest: Residual enteric contrast in the esophagus. Linear opacity in both lower lobes and right middle lobe likely related to subsegmental atelectasis or scarring. Elevated left hemidiaphragm, chronic. No pleural fluid. There are coronary artery calcifications. Hepatobiliary: Borderline hepatic steatosis. There is no evidence of focal hepatic lesion. Clips in the gallbladder fossa postcholecystectomy. No biliary dilatation. Pancreas: No ductal dilatation or inflammation. Spleen: Normal in size without focal abnormality. Adrenals/Urinary Tract: No adrenal nodule. No renal stone or hydronephrosis. No perinephric edema. Urinary bladder is unremarkable. Stomach/Bowel: Inflamed diverticulum with edema and fat stranding at the junction of the descending and sigmoid colon. Minimal non organized free fluid. No free air or perforation. Additional noninflamed colonic diverticula throughout the entire colon, more prominent on the left. Normal appendix. No small bowel inflammation or obstruction. Stomach is unremarkable. Small amount of enteric contrast within the distal esophagus. Vascular/Lymphatic: Moderately advanced aortic atherosclerosis. No aortic aneurysm. Enlarged left periaortic node series 2, image 48 measures 11 mm, previously 13 mm. Reproductive: Occasional uterine calcification may represent a fibroid. No adnexal mass. Other: Minimal free fluid in the left lower quadrant adjacent to diverticulitis. No upper abdominal ascites. No free air or abscess. Tiny fat containing umbilical hernia. Musculoskeletal: Degenerative change in the spine. There are no acute or suspicious osseous abnormalities. IMPRESSION: 1. Acute uncomplicated diverticulitis at the  junction of the descending and sigmoid colon. No perforation or abscess. This is at site of previous diverticulitis. Recommend direct visualization with colonoscopy after course of treatment to exclude the remote possibility of underlying colonic neoplasm. 2. Residual enteric contrast in the distal esophagus, can be seen with reflux or slow transit. Aortic Atherosclerosis (ICD10-I70.0). Electronically Signed   By:  Keith Rake M.D.   On: 08/15/2020 21:36    Procedures Procedures (including critical care time)  Medications Ordered in ED Medications  sodium chloride 0.9 % bolus 500 mL (0 mLs Intravenous Stopped 08/15/20 2244)  morphine 4 MG/ML injection 4 mg (4 mg Intravenous Given 08/15/20 1908)  ondansetron (ZOFRAN) injection 4 mg (4 mg Intravenous Given 08/15/20 1909)  ciprofloxacin (CIPRO) IVPB 400 mg (0 mg Intravenous Stopped 08/15/20 2346)  metroNIDAZOLE (FLAGYL) IVPB 500 mg (0 mg Intravenous Stopped 08/15/20 2346)  fentaNYL (SUBLIMAZE) injection 75 mcg (75 mcg Intravenous Given 08/15/20 2243)    ED Course  I have reviewed the triage vital signs and the nursing notes.  Pertinent labs & imaging results that were available during my care of the patient were reviewed by me and considered in my medical decision making (see chart for details).    MDM Rules/Calculators/A&P                          Patient's labs and imaging were discussed with patient. She was given Cipro and Flagyl per IV here and at time of discharge she was feeling improved. Discussed home treatment plan including continued antibiotics, discussed clear liquid diet for the next 1 to 2 days, then slowly increasing as tolerated. Encouraged plenty of fluid intake to avoid dehydration. Discussed strict return precautions including fever, worsening pain or uncontrolled vomiting. She was also advised to avoid constipation and was prescribed Colace for this. Plan follow-up with her PCP as needed. We also discussed recommendation that  she have a repeat colonoscopy once this infection is completely resolved. She sees Dr. Laural Golden and will call for further evaluation. Final Clinical Impression(s) / ED Diagnoses Final diagnoses:  Diverticulitis    Rx / DC Orders ED Discharge Orders         Ordered    ciprofloxacin (CIPRO) 500 MG tablet  Every 12 hours        08/15/20 2348    metroNIDAZOLE (FLAGYL) 500 MG tablet  3 times daily        08/15/20 2348    docusate sodium (COLACE) 100 MG capsule  Every 12 hours        08/15/20 2351           Evalee Jefferson, PA-C 08/16/20 0010    Noemi Chapel, MD 08/16/20 [phone]

## 2020-08-16 MED FILL — Oxycodone w/ Acetaminophen Tab 5-325 MG: ORAL | Qty: 6 | Status: CN

## 2020-08-19 ENCOUNTER — Other Ambulatory Visit: Payer: Self-pay

## 2020-08-19 ENCOUNTER — Ambulatory Visit (HOSPITAL_COMMUNITY)
Admission: RE | Admit: 2020-08-19 | Discharge: 2020-08-19 | Disposition: A | Discharge status: Home / Self Care | Payer: Medicare Other | Source: Ambulatory Visit | Attending: Family Medicine | Admitting: Family Medicine

## 2020-08-19 DIAGNOSIS — Z1231 Encounter for screening mammogram for malignant neoplasm of breast: Secondary | ICD-10-CM | POA: Diagnosis not present

## 2020-08-20 LAB — CULTURE, BLOOD (ROUTINE X 2)
Culture: NO GROWTH
Culture: NO GROWTH
Special Requests: ADEQUATE

## 2020-12-16 ENCOUNTER — Ambulatory Visit: Payer: Medicare Other | Admitting: Podiatry

## 2020-12-16 ENCOUNTER — Other Ambulatory Visit: Payer: Self-pay

## 2020-12-16 ENCOUNTER — Ambulatory Visit (INDEPENDENT_AMBULATORY_CARE_PROVIDER_SITE_OTHER): Payer: Medicare Other

## 2020-12-16 ENCOUNTER — Encounter: Payer: Self-pay | Admitting: Podiatry

## 2020-12-16 DIAGNOSIS — M79672 Pain in left foot: Secondary | ICD-10-CM | POA: Diagnosis not present

## 2020-12-16 DIAGNOSIS — M722 Plantar fascial fibromatosis: Secondary | ICD-10-CM | POA: Diagnosis not present

## 2020-12-16 DIAGNOSIS — M779 Enthesopathy, unspecified: Secondary | ICD-10-CM | POA: Diagnosis not present

## 2020-12-16 MED ORDER — TRIAMCINOLONE ACETONIDE 10 MG/ML IJ SUSP
10.0000 mg | Freq: Once | INTRAMUSCULAR | Status: AC
  Administered 2020-12-16: 17:00:00 10 mg

## 2020-12-18 ENCOUNTER — Other Ambulatory Visit: Payer: Self-pay | Admitting: Podiatry

## 2020-12-18 DIAGNOSIS — M779 Enthesopathy, unspecified: Secondary | ICD-10-CM

## 2020-12-18 NOTE — Progress Notes (Signed)
Subjective:   Patient ID: [name], female   DOB: 64 y.o.   MRN: [id number]   HPI Patient presents stating she has a lot of pain in the bottom of the left heel   ROS      Objective:  Physical Exam  Neurovascular status intact exquisite discomfort noted left plantar fascia at the insertional point tendon calcaneus with inflammation fluid medial band     Assessment:  Acute plantar fasciitis left     Plan:  H&P condition reviewed sterile prep done and injected the plantar fascial insertion 3 mg Dexasone Kenalog 5 mg Xylocaine applied sterile dressing.  Reappoint to recheck

## 2021-01-01 ENCOUNTER — Encounter: Payer: Self-pay | Admitting: Emergency Medicine

## 2021-01-01 ENCOUNTER — Ambulatory Visit (INDEPENDENT_AMBULATORY_CARE_PROVIDER_SITE_OTHER): Payer: Medicare Other

## 2021-01-01 ENCOUNTER — Ambulatory Visit
Admission: EM | Admit: 2021-01-01 | Discharge: 2021-01-01 | Disposition: A | Discharge status: Home / Self Care | Payer: Medicare Other | Attending: Family Medicine | Admitting: Family Medicine

## 2021-01-01 DIAGNOSIS — R0600 Dyspnea, unspecified: Secondary | ICD-10-CM

## 2021-01-01 DIAGNOSIS — R197 Diarrhea, unspecified: Secondary | ICD-10-CM

## 2021-01-01 DIAGNOSIS — R112 Nausea with vomiting, unspecified: Secondary | ICD-10-CM

## 2021-01-01 DIAGNOSIS — R0602 Shortness of breath: Secondary | ICD-10-CM

## 2021-01-01 DIAGNOSIS — R062 Wheezing: Secondary | ICD-10-CM

## 2021-01-01 DIAGNOSIS — R519 Headache, unspecified: Secondary | ICD-10-CM

## 2021-01-01 DIAGNOSIS — J22 Unspecified acute lower respiratory infection: Secondary | ICD-10-CM

## 2021-01-01 DIAGNOSIS — R52 Pain, unspecified: Secondary | ICD-10-CM

## 2021-01-01 DIAGNOSIS — R6883 Chills (without fever): Secondary | ICD-10-CM

## 2021-01-01 DIAGNOSIS — R059 Cough, unspecified: Secondary | ICD-10-CM | POA: Diagnosis not present

## 2021-01-01 MED ORDER — PREDNISONE 5 MG/ML PO CONC: 20.0000 mg | Freq: Every day | ORAL | 0 refills | Status: AC

## 2021-01-01 MED ORDER — METHYLPREDNISOLONE SODIUM SUCC 125 MG IJ SOLR
125.0000 mg | Freq: Once | INTRAMUSCULAR | Status: AC
  Administered 2021-01-01: 125 mg via INTRAMUSCULAR

## 2021-01-01 MED ORDER — AMOXICILLIN-POT CLAVULANATE 400-57 MG/5ML PO SUSR: 875.0000 mg | Freq: Two times a day (BID) | ORAL | 0 refills | Status: AC

## 2021-01-01 NOTE — ED Triage Notes (Signed)
Vomiting, diarrhea, cough,  headache, body aches and shortness of breath that started yesterday at 5pm.  Has been using albuterol inhaler with minimal relief.

## 2021-01-01 NOTE — Discharge Instructions (Addendum)
I have sent in Augmentin for you to take twice a day for 7 days.  I have also sent in prednisone for you to take once a day  You have received a steroid injection in the office to help open up your lungs today.  Your COVID test is pending.  You should self quarantine until the test result is back.    Take Tylenol or ibuprofen as needed for fever or discomfort.  Rest and keep yourself hydrated.    Follow-up with your primary care provider if your symptoms are not improving.

## 2021-01-01 NOTE — ED Provider Notes (Signed)
[facility]    CSN: [id number] Arrival date & time: 01/01/21  1232      History   Chief Complaint No chief complaint on file.   HPI [name] is a 65 y.o. female.   Reports that she has been experiencing nausea, vomiting, diarrhea, cough, headache, body aches and SOB that began yesterday. Reports that she is coughing up dark colored sputum. Reports that symptoms have been present for about 2 weeks. States that if she has a coughing episode that this can cause her to vomit. Has hx asthma. Has been using albuterol inhaler with no relief. Denies sick contacts. Has negative hx Covid. Has not completed Covid vaccines. Has not completed flu vaccine this year. She states that she is concerned that she may have pneumonia. Cough, SOB and fatigue are worse with activity. Denies rash, fever, other symptoms.   ROS per HPI  The history is provided by the patient.    Past Medical History:  Diagnosis Date  . Anxiety   . Asthma   . Chronic knee pain   . Diverticulitis   . Headache   . Hypertension   . Seasonal allergies   . Vertigo     Patient Active Problem List   Diagnosis Date Noted  . Diverticulitis of colon 09/15/2017  . Diverticulitis 05/24/2017  . AKI (acute kidney injury) ([place]) 05/24/2017  . Hypokalemia 05/24/2017  . Essential hypertension 05/24/2017  . Vertigo 05/24/2017  . CAP (community acquired pneumonia) 05/04/2015  . Sepsis ([place]) 05/02/2015  . Acute respiratory failure ([place]) 05/02/2015    Past Surgical History:  Procedure Laterality Date  . ABDOMINAL HYSTERECTOMY    . ABLATION COLPOCLESIS    . CHOLECYSTECTOMY    . COLONOSCOPY WITH PROPOFOL N/A 10/04/2017   Procedure: COLONOSCOPY WITH PROPOFOL;  Surgeon: Rogene Houston, MD;  [facility];  Service: Endoscopy;  Laterality: N/A;  1:15  . POLYPECTOMY  10/04/2017   Procedure: POLYPECTOMY;  Surgeon: Rogene Houston, MD;  Location: [facility];  Service: Endoscopy;;  sigmoid colon  polyp    OB History    Gravida  3   Para  2   Term  2   Preterm      AB  1   Living        SAB  1   IAB      Ectopic      Multiple      Live Births               Home Medications    Prior to Admission medications   Medication Sig Start Date End Date Taking? Authorizing Provider  amoxicillin-clavulanate (AUGMENTIN) 400-57 MG/5ML suspension Take 10.9 mLs (875 mg total) by mouth 2 (two) times daily for 7 days. 01/01/21 01/08/21 Yes Faustino Congress, NP  predniSONE 5 MG/ML concentrated solution Take 4 mLs (20 mg total) by mouth daily with breakfast for 5 days. 01/01/21 01/06/21 Yes Faustino Congress, NP  albuterol (PROVENTIL HFA;VENTOLIN HFA) 108 (90 Base) MCG/ACT inhaler Inhale 2 puffs into the lungs every 6 (six) hours as needed for wheezing or shortness of breath.     [provider]  ALPRAZolam Duanne Moron) 1 MG tablet Take 2 mg by mouth at bedtime.    [provider]  docusate sodium (COLACE) 100 MG capsule Take 1 capsule (100 mg total) by mouth every 12 (twelve) hours. 08/15/20   Evalee Jefferson, PA-C  fluorometholone (FML) 0.1 % ophthalmic suspension Place 1 drop into both  eyes 4 (four) times daily.  08/07/19   [provider]  fluticasone (FLONASE) 50 MCG/ACT nasal spray Place 1-2 sprays into both nostrils daily as needed for allergies or rhinitis.     [provider]  metroNIDAZOLE (FLAGYL) 500 MG tablet Take 1 tablet (500 mg total) by mouth 3 (three) times daily. 08/15/20   Evalee Jefferson, PA-C  Multiple Vitamin (MULTIVITAMIN WITH MINERALS) TABS tablet Take 1 tablet by mouth daily.    [provider]  oxyCODONE-acetaminophen (PERCOCET) 10-325 MG tablet Take 1 tablet by mouth 3 (three) times daily as needed for pain.    [provider]  triamcinolone cream (KENALOG) 0.1 % Apply 1 application topically 2 (two) times daily. Apply sparingly to neck rash for up to 10 days. Patient taking differently: Apply 1 application  topically 2 (two) times daily as needed (for rash).  09/23/17   Evalee Jefferson, PA-C  triamterene-hydrochlorothiazide (MAXZIDE) 75-50 MG tablet Take 0.5 tablets by mouth daily. Resume in 1 week Patient taking differently: Take 0.5 tablets by mouth daily.  05/25/17   Kathie Dike, MD  Wheat Dextrin (BENEFIBER DRINK MIX) PACK Take 4 g by mouth at bedtime. Patient taking differently: Take 4 g by mouth daily.  10/04/17   Rogene Houston, MD    Family History Family History  Problem Relation Age of Onset  . Emphysema Mother   . Stroke Father   . Hypertension Father   . Diabetes Sister   . Hypertension Sister   . Stroke Brother   . Hypertension Brother     Social History Social History   Tobacco Use  . Smoking status: Never Smoker  . Smokeless tobacco: Never Used  Vaping Use  . Vaping Use: Never used  Substance Use Topics  . Alcohol use: No  . Drug use: No     Allergies   Iohexol, Latex, Benadryl [diphenhydramine], and Sulfa antibiotics   Review of Systems Review of Systems   Physical Exam Triage Vital Signs ED Triage Vitals  Enc Vitals Group     BP 01/01/21 1248 (!) 170/83     Pulse Rate 01/01/21 1248 96     Resp 01/01/21 1248 20     Temp 01/01/21 1248 100.1 F (37.8 C)     Temp Source 01/01/21 1248 Oral     SpO2 01/01/21 1248 97 %     Weight 01/01/21 1249 153 lb (69.4 kg)     Height 01/01/21 1249 4\' 11"  (1.499 m)     Head Circumference --      Peak Flow --      Pain Score 01/01/21 1249 0     Pain Loc --      Pain Edu? --      Excl. in [place]? --    No data found.  Updated Vital Signs BP (!) 170/83 (BP Location: Right Arm)   Pulse 96   Temp 100.1 F (37.8 C) (Oral)   Resp 20   Ht 4\' 11"  (1.499 m)   Wt 153 lb (69.4 kg)   SpO2 97%   BMI 30.90 kg/m   Physical Exam Vitals and nursing note reviewed.  Constitutional:      General: She is not in acute distress.    Appearance: She is well-developed and well-nourished. She is ill-appearing.  HENT:      Head: Normocephalic and atraumatic.     Right Ear: Tympanic membrane, ear canal and external ear normal.     Left Ear: Tympanic membrane, ear canal  and external ear normal.     Nose: Congestion and rhinorrhea present.     Mouth/Throat:     Mouth: Mucous membranes are moist.     Pharynx: Posterior oropharyngeal erythema present.  Eyes:     Extraocular Movements: Extraocular movements intact.     Conjunctiva/sclera: Conjunctivae normal.     Pupils: Pupils are equal, round, and reactive to light.  Cardiovascular:     Rate and Rhythm: Normal rate and regular rhythm.     Heart sounds: No murmur heard.   Pulmonary:     Effort: Pulmonary effort is normal. No respiratory distress.     Breath sounds: No stridor. Wheezing and rhonchi present. No rales.  Chest:     Chest wall: No tenderness.  Abdominal:     General: Bowel sounds are normal.     Palpations: Abdomen is soft.     Tenderness: There is no abdominal tenderness.  Musculoskeletal:        General: No edema. Normal range of motion.     Cervical back: Normal range of motion and neck supple.  Lymphadenopathy:     Cervical: Cervical adenopathy present.  Skin:    General: Skin is warm and dry.     Capillary Refill: Capillary refill takes less than 2 seconds.  Neurological:     General: No focal deficit present.     Mental Status: She is alert and oriented to person, place, and time.  Psychiatric:        Mood and Affect: Mood and affect and mood normal.        Behavior: Behavior normal.        Thought Content: Thought content normal.      UC Treatments / Results  Labs (all labs ordered are listed, but only abnormal results are displayed) Labs Reviewed  COVID-19, FLU A+B NAA    EKG   Radiology DG Chest 2 View  Result Date: 01/01/2021 CLINICAL DATA:  Shortness of breath and chills. EXAM: CHEST - 2 VIEW COMPARISON:  11/02/2019. FINDINGS: Trachea is midline. Heart is enlarged. Lungs are low in volume with thickening of  minor fissure. Minimal streaky atelectasis in the left lower lobe. No airspace consolidation or pleural fluid. IMPRESSION: Low lung volumes.  No acute findings. Electronically Signed   By: Lorin Picket M.D.   On: 01/01/2021 13:05    Procedures Procedures (including critical care time)  Medications Ordered in UC Medications  methylPREDNISolone sodium succinate (SOLU-MEDROL) 125 mg/2 mL injection 125 mg (125 mg Intramuscular Given 01/01/21 1352)    Initial Impression / Assessment and Plan / UC Course  I have reviewed the triage vital signs and the nursing notes.  Pertinent labs & imaging results that were available during my care of the patient were reviewed by me and considered in my medical decision making (see chart for details).    Lower respiratory infection Nausea, Vomiting, Diarrhea Cough SOB Wheezing Headache Body aches  Chest xray today shows low lung volumes, but no evidence of pneumonia Solumedrol 125mg  IM in office today Prescribed Augmentin BID Prescribed prednisone  Covid and flu swab obtained in office today.   Patient instructed to quarantine until results are back and negative.   If results are negative, patient may resume daily schedule as tolerated once they are fever free for 24 hours without the use of antipyretic medications.   If results are positive, patient instructed to quarantine for at least 5 days from symptom onset.  If after 5 days symptoms have  resolved, may return to work with a well fitting mask for the next 5 days. If symptomatic after day 5, isolation should be extended to 10 days. Patient instructed to follow-up with primary care or with this office as needed.   Patient instructed to follow-up in the ER for trouble swallowing, trouble breathing, other concerning symptoms.   Final Clinical Impressions(s) / UC Diagnoses   Final diagnoses:  Dyspnea, unspecified type  SOB (shortness of breath)  Lower respiratory infection  Cough  Wheezing   Nausea vomiting and diarrhea  Nonintractable headache, unspecified chronicity pattern, unspecified headache type  Body aches     Discharge Instructions     I have sent in Augmentin for you to take twice a day for 7 days.  I have also sent in prednisone for you to take once a day  You have received a steroid injection in the office to help open up your lungs today.  Your COVID test is pending.  You should self quarantine until the test result is back.    Take Tylenol or ibuprofen as needed for fever or discomfort.  Rest and keep yourself hydrated.    Follow-up with your primary care provider if your symptoms are not improving.        ED Prescriptions    Medication Sig Dispense Auth. Provider   amoxicillin-clavulanate (AUGMENTIN) 400-57 MG/5ML suspension Take 10.9 mLs (875 mg total) by mouth 2 (two) times daily for 7 days. 200 mL Faustino Congress, NP   predniSONE 5 MG/ML concentrated solution Take 4 mLs (20 mg total) by mouth daily with breakfast for 5 days. 30 mL Faustino Congress, NP     PDMP not reviewed this encounter.   Faustino Congress, NP 01/03/21 1233

## 2021-01-03 LAB — COVID-19, FLU A+B NAA
Influenza A, NAA: NOT DETECTED
Influenza B, NAA: NOT DETECTED
SARS-CoV-2, NAA: DETECTED — AB

## 2021-01-06 ENCOUNTER — Encounter (HOSPITAL_COMMUNITY): Payer: Self-pay | Admitting: Emergency Medicine

## 2021-01-06 ENCOUNTER — Other Ambulatory Visit: Payer: Self-pay

## 2021-01-06 DIAGNOSIS — U071 COVID-19: Secondary | ICD-10-CM | POA: Diagnosis not present

## 2021-01-06 DIAGNOSIS — Z5321 Procedure and treatment not carried out due to patient leaving prior to being seen by health care provider: Secondary | ICD-10-CM | POA: Diagnosis not present

## 2021-01-06 DIAGNOSIS — R0602 Shortness of breath: Secondary | ICD-10-CM | POA: Diagnosis present

## 2021-01-06 NOTE — ED Triage Notes (Signed)
Pt dx on 1/12 with COVID. States her sob has worsened since then. Pt with increased respirations in triage. O2 100% on RA.

## 2021-01-07 ENCOUNTER — Emergency Department (HOSPITAL_COMMUNITY)
Admission: EM | Admit: 2021-01-07 | Discharge: 2021-01-07 | Disposition: A | Discharge status: Home / Self Care | Payer: Medicare Other | Attending: Emergency Medicine | Admitting: Emergency Medicine

## 2021-01-07 NOTE — ED Notes (Signed)
Per registration pt has left the facility °

## 2021-01-20 ENCOUNTER — Encounter (HOSPITAL_COMMUNITY): Payer: Self-pay | Admitting: *Deleted

## 2021-01-20 ENCOUNTER — Other Ambulatory Visit: Payer: Self-pay

## 2021-01-20 ENCOUNTER — Emergency Department (HOSPITAL_COMMUNITY): Payer: Medicare Other

## 2021-01-20 ENCOUNTER — Emergency Department (HOSPITAL_COMMUNITY)
Admission: EM | Admit: 2021-01-20 | Discharge: 2021-01-20 | Disposition: A | Discharge status: Home / Self Care | Payer: Medicare Other | Attending: Emergency Medicine | Admitting: Emergency Medicine

## 2021-01-20 DIAGNOSIS — I10 Essential (primary) hypertension: Secondary | ICD-10-CM | POA: Insufficient documentation

## 2021-01-20 DIAGNOSIS — M25562 Pain in left knee: Secondary | ICD-10-CM | POA: Diagnosis not present

## 2021-01-20 DIAGNOSIS — Z79899 Other long term (current) drug therapy: Secondary | ICD-10-CM | POA: Diagnosis not present

## 2021-01-20 DIAGNOSIS — J45909 Unspecified asthma, uncomplicated: Secondary | ICD-10-CM | POA: Diagnosis not present

## 2021-01-20 DIAGNOSIS — Z91040 Latex allergy status: Secondary | ICD-10-CM | POA: Diagnosis not present

## 2021-01-20 DIAGNOSIS — Z7952 Long term (current) use of systemic steroids: Secondary | ICD-10-CM | POA: Insufficient documentation

## 2021-01-20 NOTE — ED Notes (Signed)
Entered room and introduced self to patient. Pt appears to be resting in chair, respirations are even and unlabored with equal chest rise and fall. Call bell within reach. Pt educated on call light use and hourly rounding, verbalized understanding and in agreement at this time. All questions and concerns voiced addressed. Refreshments offered and provided per patient request.  
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
## 2021-01-20 NOTE — ED Notes (Signed)
ED Provider at bedside. 
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
## 2021-01-20 NOTE — Discharge Instructions (Addendum)
You came to the emergency department today to have your left knee pain evaluated.  Your physical exam was reassuring.  The x-ray of your left knee showed no acute fracture or dislocation.  It did show some osteoarthritic changes.  Due to your pain I have placed you in a knee immobilizer.  Please use this while walking.  Continue to use it until you can have evaluation by your orthopedic doctor, Dr. Luna Glasgow.  Please ice your knee and keep it elevated as much as possible.  Please take Ibuprofen (Advil, motrin) and Tylenol (acetaminophen) to relieve your pain.    You may take up to 600 MG (3 pills) of normal strength ibuprofen every 8 hours as needed.   You make take tylenol, up to 1,000 mg (two extra strength pills) every 8 hours as needed.   It is safe to take ibuprofen and tylenol at the same time as they work differently.   Do not take more than 3,000 mg tylenol in a 24 hour period (not more than one dose every 8 hours.  Please check all medication labels as many medications such as pain and cold medications may contain tylenol.  Do not drink alcohol while taking these medications.  Do not take other NSAID'S while taking ibuprofen (such as aleve or naproxen).  Please take ibuprofen with food to decrease stomach upset.   Get help right away if: Your knee swells, and the swelling gets worse. You cannot move your knee. You have very bad knee pain that does not get better with pain medicine.

## 2021-01-28 ENCOUNTER — Ambulatory Visit: Payer: Medicare Other | Admitting: Orthopaedic Surgery

## 2021-01-28 ENCOUNTER — Encounter: Payer: Self-pay | Admitting: Orthopaedic Surgery

## 2021-01-28 ENCOUNTER — Ambulatory Visit: Payer: Medicare Other

## 2021-01-28 ENCOUNTER — Other Ambulatory Visit: Payer: Self-pay

## 2021-01-28 VITALS — BP 161/82 | HR 75 | Ht 60.0 in | Wt 180.0 lb

## 2021-01-28 DIAGNOSIS — M25561 Pain in right knee: Secondary | ICD-10-CM

## 2021-01-28 DIAGNOSIS — M25562 Pain in left knee: Secondary | ICD-10-CM | POA: Diagnosis not present

## 2021-01-28 DIAGNOSIS — G8929 Other chronic pain: Secondary | ICD-10-CM
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
## 2021-01-28 NOTE — Patient Instructions (Signed)
Out of work for two weeks. Return to clinic in two weeks

## 2021-02-05 ENCOUNTER — Encounter: Payer: Self-pay | Admitting: Orthopaedic Surgery

## 2021-02-05 ENCOUNTER — Telehealth: Payer: Self-pay | Admitting: Orthopaedic Surgery

## 2021-02-05 NOTE — Telephone Encounter (Addendum)
Out of work note approved for 2 weeks until next appointment after her MRI.  MRI cannot be scheduled until 02/18/21. I did change her appointment to 02/20/21 after her MRI.   Is it okay to extend out of work note?

## 2021-02-05 NOTE — Telephone Encounter (Signed)
Yes

## 2021-02-10 ENCOUNTER — Ambulatory Visit (HOSPITAL_COMMUNITY): Payer: Medicare Other

## 2021-02-18 ENCOUNTER — Ambulatory Visit (HOSPITAL_COMMUNITY)
Admission: RE | Admit: 2021-02-18 | Discharge: 2021-02-18 | Disposition: A | Discharge status: Home / Self Care | Payer: Medicare Other | Source: Ambulatory Visit | Attending: Orthopaedic Surgery | Admitting: Orthopaedic Surgery

## 2021-02-18 ENCOUNTER — Other Ambulatory Visit: Payer: Self-pay

## 2021-02-18 DIAGNOSIS — G8929 Other chronic pain: Secondary | ICD-10-CM | POA: Insufficient documentation

## 2021-02-18 DIAGNOSIS — M25561 Pain in right knee: Secondary | ICD-10-CM | POA: Insufficient documentation

## 2021-02-20 ENCOUNTER — Other Ambulatory Visit: Payer: Self-pay

## 2021-02-20 ENCOUNTER — Ambulatory Visit: Payer: Medicare Other | Admitting: Orthopaedic Surgery

## 2021-02-20 ENCOUNTER — Encounter: Payer: Self-pay | Admitting: Orthopaedic Surgery

## 2021-02-20 ENCOUNTER — Telehealth: Payer: Self-pay | Admitting: Orthopaedic Surgery

## 2021-02-20 VITALS — BP 148/71 | HR 86 | Ht 60.0 in | Wt 183.0 lb

## 2021-02-20 DIAGNOSIS — M1711 Unilateral primary osteoarthritis, right knee: Secondary | ICD-10-CM | POA: Diagnosis not present

## 2021-02-20 DIAGNOSIS — M79672 Pain in left foot: Secondary | ICD-10-CM | POA: Diagnosis not present

## 2021-02-20 DIAGNOSIS — G8929 Other chronic pain: Secondary | ICD-10-CM

## 2021-02-20 DIAGNOSIS — S83271A Complex tear of lateral meniscus, current injury, right knee, initial encounter: Secondary | ICD-10-CM

## 2021-02-20 NOTE — Progress Notes (Signed)
Patient [name], female DOB:1956-09-10, 65 y.o. [id number]  Chief Complaint  Patient presents with  . Knee Pain    Right knee MRI    HPI  [name] is a 65 y.o. female who has right knee pain. She had MRI which showed: IMPRESSION: Dominant finding is osteoarthritis about the knee which is severe in the lateral compartment.  Severe complex tearing throughout the lateral meniscus.  I have explained the findings to her.  I will have her see Dr. Aline Brochure or Amedeo Kinsman about this.  She has continued pain of the left heel. She has seen the podiatrist in [place] about this.  I have recommended wearing boot at night.   Body mass index is 35.74 kg/m.  ROS  Review of Systems  Constitutional: Positive for activity change.  Respiratory: Positive for shortness of breath.   Musculoskeletal: Positive for arthralgias and gait problem.  Neurological: Positive for headaches.  All other systems reviewed and are negative.   All other systems reviewed and are negative.  The following is a summary of the past history medically, past history surgically, known current medicines, social history and family history.  This information is gathered electronically by the computer from prior information and documentation.  I review this each visit and have found including this information at this point in the chart is beneficial and informative.    Past Medical History:  Diagnosis Date  . Anxiety   . Asthma   . Chronic knee pain   . Diverticulitis   . Headache   . Hypertension   . Seasonal allergies   . Vertigo     Past Surgical History:  Procedure Laterality Date  . ABDOMINAL HYSTERECTOMY    . ABLATION COLPOCLESIS    . CHOLECYSTECTOMY    . COLONOSCOPY WITH PROPOFOL N/A 10/04/2017   Procedure: COLONOSCOPY WITH PROPOFOL;  Surgeon: Rogene Houston, MD;  [facility];  Service: Endoscopy;  Laterality: N/A;  1:15  . POLYPECTOMY  10/04/2017   Procedure:  POLYPECTOMY;  Surgeon: Rogene Houston, MD;  [facility];  Service: Endoscopy;;  sigmoid colon polyp    Family History  Problem Relation Age of Onset  . Emphysema Mother   . Stroke Father   . Hypertension Father   . Diabetes Sister   . Hypertension Sister   . Stroke Brother   . Hypertension Brother     Social History Social History   Tobacco Use  . Smoking status: Never Smoker  . Smokeless tobacco: Never Used  Vaping Use  . Vaping Use: Never used  Substance Use Topics  . Alcohol use: No  . Drug use: No    Allergies  Allergen Reactions  . Iohexol Anaphylaxis       . Latex Other (See Comments)    Causes dark spots on skin, but not pain, or irritation.   . Benadryl [Diphenhydramine] Rash and Other (See Comments)    Liquid. Pt states she can take pills.  . Sulfa Antibiotics Rash and Other (See Comments)    Minor Shaking    Current Outpatient Medications  Medication Sig Dispense Refill  . albuterol (PROVENTIL HFA;VENTOLIN HFA) 108 (90 Base) MCG/ACT inhaler Inhale 2 puffs into the lungs every 6 (six) hours as needed for wheezing or shortness of breath.     . ALPRAZolam (XANAX) 1 MG tablet Take 2 mg by mouth at bedtime.    . docusate sodium (COLACE) 100 MG capsule Take 1 capsule (100 mg total) by  mouth every 12 (twelve) hours. 20 capsule 0  . fluorometholone (FML) 0.1 % ophthalmic suspension Place 1 drop into both eyes 4 (four) times daily.     . fluticasone (FLONASE) 50 MCG/ACT nasal spray Place 1-2 sprays into both nostrils daily as needed for allergies or rhinitis.     Marland Kitchen metroNIDAZOLE (FLAGYL) 500 MG tablet Take 1 tablet (500 mg total) by mouth 3 (three) times daily. 21 tablet 0  . Multiple Vitamin (MULTIVITAMIN WITH MINERALS) TABS tablet Take 1 tablet by mouth daily.    Marland Kitchen oxyCODONE-acetaminophen (PERCOCET) 10-325 MG tablet Take 1 tablet by mouth 3 (three) times daily as needed for pain.    Marland Kitchen triamcinolone cream (KENALOG) 0.1 % Apply 1 application topically  2 (two) times daily. Apply sparingly to neck rash for up to 10 days. (Patient taking differently: Apply 1 application topically 2 (two) times daily as needed (for rash).) 30 g 0  . triamterene-hydrochlorothiazide (MAXZIDE) 75-50 MG tablet Take 0.5 tablets by mouth daily. Resume in 1 week (Patient taking differently: Take 0.5 tablets by mouth daily.)    . Wheat Dextrin (BENEFIBER DRINK MIX) PACK Take 4 g by mouth at bedtime. (Patient taking differently: Take 4 g by mouth daily.)     No current facility-administered medications for this visit.     Physical Exam  Blood pressure (!) 148/71, pulse 86, height 5' (1.524 m), weight 183 lb (83 kg).  Constitutional: overall normal hygiene, normal nutrition, well developed, normal grooming, normal body habitus. Assistive device:none  Musculoskeletal: gait and station Limp right, muscle tone and strength are normal, no tremors or atrophy is present.  .  Neurological: coordination overall normal.  Deep tendon reflex/nerve stretch intact.  Sensation normal.  Cranial nerves II-XII intact.   Skin:   Normal overall no scars, lesions, ulcers or rashes. No psoriasis.  Psychiatric: Alert and oriented x 3.  Recent memory intact, remote memory unclear.  Normal mood and affect. Well groomed.  Good eye contact.  Cardiovascular: overall no swelling, no varicosities, no edema bilaterally, normal temperatures of the legs and arms, no clubbing, cyanosis and good capillary refill.  Lymphatic: palpation is normal.  Right knee is tender, more pain laterally, ROM 0 to 105, effusion, crepitus.  NV intact.  Left heel is tender plantar side.  NV intact.    All other systems reviewed and are negative   The patient has been educated about the nature of the problem(s) and counseled on treatment options.  The patient appeared to understand what I have discussed and is in agreement with it.  Encounter Diagnoses  Name Primary?  . Chronic pain of right knee Yes  .  Chronic heel pain, left     PLAN Call if any problems.  Precautions discussed.  Continue current medications.   Return to clinic 1 month   To see Dr. Aline Brochure or Amedeo Kinsman about knee.  Electronically Signed Sanjuana Kava, MD 3/3/202210:50 AM

## 2021-02-20 NOTE — Telephone Encounter (Signed)
Patient question about form for out of work, [employer] - states has a paper, however, based on what she has indicated over phone, it is not the actual FMLA form itself, for physician to complete. Discussed form process through [employer]; voiced understanding.

## 2021-03-10 ENCOUNTER — Other Ambulatory Visit: Payer: Self-pay

## 2021-03-10 ENCOUNTER — Ambulatory Visit: Payer: Medicare Other | Admitting: Orthopedic Surgery

## 2021-03-10 ENCOUNTER — Encounter: Payer: Self-pay | Admitting: Orthopedic Surgery

## 2021-03-10 VITALS — BP 134/83 | HR 75 | Ht 60.0 in | Wt 185.0 lb

## 2021-03-10 DIAGNOSIS — S83271A Complex tear of lateral meniscus, current injury, right knee, initial encounter: Secondary | ICD-10-CM | POA: Diagnosis not present

## 2021-03-10 DIAGNOSIS — M1711 Unilateral primary osteoarthritis, right knee: Secondary | ICD-10-CM | POA: Diagnosis not present

## 2021-03-10 DIAGNOSIS — M1710 Unilateral primary osteoarthritis, unspecified knee: Secondary | ICD-10-CM

## 2021-03-10 NOTE — Progress Notes (Signed)
Chief Complaint  Patient presents with   Knee Pain    L/hurting about the same.    HPI: 65 year old female with 3-year history of pain right knee with loss of motion.  Does have 1 injection in the right knee.  Complains of lateral knee pain and the knee giving out frequently.  She has a chronic pain in her right upper quadrant for which she takes intermittent Percocet 09/22/2024.  She does not take any arthritis medicine.  She did take some ibuprofen but she was concerned that it would interfere with her GI tract.  Dr. Luna Glasgow is referred her to me for possible surgery after MRI showed lateral compartment degeneration and torn lateral meniscus  Past Medical History:  Diagnosis Date   Anxiety    Asthma    Chronic knee pain    Diverticulitis    Headache    Hypertension    Seasonal allergies    Vertigo    Body mass index is 36.13 kg/m.  Past Surgical History:  Procedure Laterality Date   ABDOMINAL HYSTERECTOMY     ABLATION COLPOCLESIS     CHOLECYSTECTOMY     COLONOSCOPY WITH PROPOFOL N/A 10/04/2017   Procedure: COLONOSCOPY WITH PROPOFOL;  Surgeon: Rogene Houston, MD;  [facility];  Service: Endoscopy;  Laterality: N/A;  1:15   POLYPECTOMY  10/04/2017   Procedure: POLYPECTOMY;  Surgeon: Rogene Houston, MD;  Location: [facility];  Service: Endoscopy;;  sigmoid colon polyp    No orders of the defined types were placed in this encounter.   Current Outpatient Medications:    albuterol (PROVENTIL HFA;VENTOLIN HFA) 108 (90 Base) MCG/ACT inhaler, Inhale 2 puffs into the lungs every 6 (six) hours as needed for wheezing or shortness of breath. , Disp: , Rfl:    ALPRAZolam (XANAX) 1 MG tablet, Take 2 mg by mouth at bedtime., Disp: , Rfl:    docusate sodium (COLACE) 100 MG capsule, Take 1 capsule (100 mg total) by mouth every 12 (twelve) hours., Disp: 20 capsule, Rfl: 0   fluorometholone (FML) 0.1 % ophthalmic suspension, Place 1 drop into both eyes 4  (four) times daily. , Disp: , Rfl:    fluticasone (FLONASE) 50 MCG/ACT nasal spray, Place 1-2 sprays into both nostrils daily as needed for allergies or rhinitis. , Disp: , Rfl:    metroNIDAZOLE (FLAGYL) 500 MG tablet, Take 1 tablet (500 mg total) by mouth 3 (three) times daily., Disp: 21 tablet, Rfl: 0   Multiple Vitamin (MULTIVITAMIN WITH MINERALS) TABS tablet, Take 1 tablet by mouth daily., Disp: , Rfl:    oxyCODONE-acetaminophen (PERCOCET) 10-325 MG tablet, Take 1 tablet by mouth 3 (three) times daily as needed for pain., Disp: , Rfl:    triamcinolone cream (KENALOG) 0.1 %, Apply 1 application topically 2 (two) times daily. Apply sparingly to neck rash for up to 10 days. (Patient taking differently: Apply 1 application topically 2 (two) times daily as needed (for rash).), Disp: 30 g, Rfl: 0   triamterene-hydrochlorothiazide (MAXZIDE) 75-50 MG tablet, Take 0.5 tablets by mouth daily. Resume in 1 week (Patient taking differently: Take 0.5 tablets by mouth daily.), Disp: , Rfl:    Wheat Dextrin (BENEFIBER DRINK MIX) PACK, Take 4 g by mouth at bedtime. (Patient taking differently: Take 4 g by mouth daily.), Disp: , Rfl:    BP 134/83    Pulse 75    Ht 5' (1.524 m)    Wt 185 lb (83.9 kg)    BMI 36.13  kg/m   Physical Exam Constitutional:      General: She is not in acute distress.    Appearance: Normal appearance. She is well-developed.     Comments: Well developed, well nourished Normal grooming and hygiene     Cardiovascular:     Comments: No peripheral edema Musculoskeletal:     Comments: Right knee lateral tenderness mild medial tenderness, no effusion  She has a 5 to 7 degree flexion contracture and only 80 degrees of knee flexion  Knee feels stable muscle tone is normal  Skin:    General: Skin is warm and dry.     Capillary Refill: Capillary refill takes less than 2 seconds.  Neurological:     Mental Status: She is alert and oriented to person, place, and time.      Sensory: No sensory deficit.     Coordination: Coordination normal.     Gait: Gait normal.     Deep Tendon Reflexes: Reflexes are normal and symmetric.  Psychiatric:        Mood and Affect: Mood normal.        Behavior: Behavior normal.        Thought Content: Thought content normal.        Judgment: Judgment normal.     Comments: Affect normal      Encounter Diagnoses  Name Primary?   Complex tear of lateral meniscus of right knee as current injury, initial encounter Yes   Primary localized osteoarthritis of knee-RIGHT    Ms. Pelphrey has a very difficult condition to treat.  She has arthritis and torn cartilage in the same compartment.  She has a little bit of valgus deformity on her x-ray.  She is not ready for knee replacement so we decided to go with something smaller with the understanding that she was still have knee pain and her range of motion deficit is very concerning and may not improve and is unlikely to improve although we will try with therapy afterwards  Plan [facility]ENISCUS

## 2021-03-10 NOTE — Patient Instructions (Signed)
Meniscus Injury, Arthroscopy   Arthroscopy is a surgical procedure that involves the use of a small scope that has a camera and surgical instruments on the end (arthroscope). An arthroscope can be used to repair your meniscus injury.  LET YOUR HEALTH CARE PROVIDER KNOW ABOUT:  Any allergies you have.  All medicines you are taking, including vitamins, herbs, eyedrops, creams, and over-the-counter medicines.  Any recent colds or infections you have had or currently have.  Previous problems you or members of your family have had with the use of anesthetics.  Any blood disorders or blood clotting problems you have.  Previous surgeries you have had.  Medical conditions you have. RISKS AND COMPLICATIONS Generally, this is a safe procedure. However, as with any procedure, problems can occur. Possible problems include:  Damage to nerves or blood vessels.  Excess bleeding.  Blood clots.  Infection. BEFORE THE PROCEDURE  Do not eat or drink for 6-8 hours before the procedure.  Take medicines as directed by your surgeon. Ask your surgeon about changing or stopping your regular medicines.  You may have lab tests the morning of surgery. PROCEDURE  You will be given one of the following:   A medicine that numbs the area (local anesthesia).  A medicine that makes you go to sleep (general anesthesia).  A medicine injected into your spine that numbs your body below the waist (spinal anesthesia). Most often, several small cuts (incisions) are made in the knee. The arthroscope and instruments go into the incisions to repair the damage. The torn portion of the meniscus is removed.   AFTER THE PROCEDURE  You will be taken to the recovery area where your progress will be monitored. When you are awake, stable, and taking fluids without complications, you will be allowed to go home. This is usually the same day. A torn or stretched ligament (ligament sprain) may take 6-8 weeks to heal.   It  takes about the 4-6 WEEKS if your surgeon removed a torn meniscus.  A repaired meniscus may require 6-12 weeks of recovery time.  A torn ligament needing reconstructive surgery may take 6-12 months to heal fully.   This information is not intended to replace advice given to you by your health care provider. Make sure you discuss any questions you have with your health care provider. You have decided to proceed with operative arthroscopy of the knee. You have decided not to continue with nonoperative measures such as but not limited to oral medication, weight loss, activity modification, physical therapy, bracing, or injection.  We will perform operative arthroscopy of the knee. Some of the risks associated with arthroscopic surgery of the knee include but are not limited to Bleeding Infection Swelling Stiffness Blood clot Pain Need for knee replacement surgery    In compliance with recent [place] law in federal regulation regarding opioid use and abuse and addiction, we will taper (stop) opioid medication after 2 weeks.  If you're not comfortable with these risks and would like to continue with nonoperative treatment please let Dr. Bryar Dahms know prior to your surgery. 

## 2021-03-20 ENCOUNTER — Other Ambulatory Visit: Payer: Self-pay

## 2021-03-20 ENCOUNTER — Ambulatory Visit: Payer: Medicare Other | Admitting: Orthopaedic Surgery

## 2021-03-20 ENCOUNTER — Encounter: Payer: Self-pay | Admitting: Orthopaedic Surgery

## 2021-03-20 VITALS — BP 137/71 | HR 90 | Ht 60.0 in | Wt 186.1 lb

## 2021-03-20 DIAGNOSIS — M79672 Pain in left foot: Secondary | ICD-10-CM

## 2021-03-20 DIAGNOSIS — S83271D Complex tear of lateral meniscus, current injury, right knee, subsequent encounter: Secondary | ICD-10-CM | POA: Diagnosis not present

## 2021-03-20 DIAGNOSIS — G8929 Other chronic pain: Secondary | ICD-10-CM

## 2021-03-20 NOTE — Progress Notes (Signed)
Patient WU:[id number] [name], female DOB:06-May-1956, 65 y.o. NWG:[id number]  Chief Complaint  Patient presents with  . Foot Pain    L/my heel is hurting.    HPI  KAYLEANNA LORMAN is a 65 y.o. female who has chronic heel pain left. She has been using the boot at night.  It helps but she has the pain still and has ruined two pairs of sheets.  I told her to put something on the boot to avoid it hurting the sheets. It takes time for the boot to work.  She has right knee pain and has decided to have surgery.  She will contact Dr. Aline Brochure to arrange this. She has giving way more and more.   Body mass index is 36.35 kg/m.  ROS  Review of Systems  Constitutional: Positive for activity change.  Respiratory: Positive for shortness of breath.   Musculoskeletal: Positive for arthralgias and gait problem.  Neurological: Positive for headaches.  All other systems reviewed and are negative.   All other systems reviewed and are negative.  The following is a summary of the past history medically, past history surgically, known current medicines, social history and family history.  This information is gathered electronically by the computer from prior information and documentation.  I review this each visit and have found including this information at this point in the chart is beneficial and informative.    Past Medical History:  Diagnosis Date  . Anxiety   . Asthma   . Chronic knee pain   . Diverticulitis   . Headache   . Hypertension   . Seasonal allergies   . Vertigo     Past Surgical History:  Procedure Laterality Date  . ABDOMINAL HYSTERECTOMY    . ABLATION COLPOCLESIS    . CHOLECYSTECTOMY    . COLONOSCOPY WITH PROPOFOL N/A 10/04/2017   Procedure: COLONOSCOPY WITH PROPOFOL;  Surgeon: Rogene Houston, MD;  [facility];  Service: Endoscopy;  Laterality: N/A;  1:15  . POLYPECTOMY  10/04/2017   Procedure: POLYPECTOMY;  Surgeon: Rogene Houston, MD;  [facility];  Service: Endoscopy;;  sigmoid colon polyp    Family History  Problem Relation Age of Onset  . Emphysema Mother   . Stroke Father   . Hypertension Father   . Diabetes Sister   . Hypertension Sister   . Stroke Brother   . Hypertension Brother     Social History Social History   Tobacco Use  . Smoking status: Never Smoker  . Smokeless tobacco: Never Used  Vaping Use  . Vaping Use: Never used  Substance Use Topics  . Alcohol use: No  . Drug use: No    Allergies  Allergen Reactions  . Iohexol Anaphylaxis       . Latex Other (See Comments)    Causes dark spots on skin, but not pain, or irritation.   . Benadryl [Diphenhydramine] Rash and Other (See Comments)    Liquid. Pt states she can take pills.  . Sulfa Antibiotics Rash and Other (See Comments)    Minor Shaking    Current Outpatient Medications  Medication Sig Dispense Refill  . albuterol (PROVENTIL HFA;VENTOLIN HFA) 108 (90 Base) MCG/ACT inhaler Inhale 2 puffs into the lungs every 6 (six) hours as needed for wheezing or shortness of breath.     . ALPRAZolam (XANAX) 1 MG tablet Take 2 mg by mouth at bedtime.    . docusate sodium (COLACE) 100 MG capsule Take 1 capsule (  100 mg total) by mouth every 12 (twelve) hours. 20 capsule 0  . fluorometholone (FML) 0.1 % ophthalmic suspension Place 1 drop into both eyes 4 (four) times daily.     . fluticasone (FLONASE) 50 MCG/ACT nasal spray Place 1-2 sprays into both nostrils daily as needed for allergies or rhinitis.     Marland Kitchen metroNIDAZOLE (FLAGYL) 500 MG tablet Take 1 tablet (500 mg total) by mouth 3 (three) times daily. 21 tablet 0  . Multiple Vitamin (MULTIVITAMIN WITH MINERALS) TABS tablet Take 1 tablet by mouth daily.    Marland Kitchen oxyCODONE-acetaminophen (PERCOCET) 10-325 MG tablet Take 1 tablet by mouth 3 (three) times daily as needed for pain.    Marland Kitchen triamcinolone cream (KENALOG) 0.1 % Apply 1 application topically 2 (two) times daily. Apply sparingly to neck rash for up to 10  days. (Patient taking differently: Apply 1 application topically 2 (two) times daily as needed (for rash).) 30 g 0  . triamterene-hydrochlorothiazide (MAXZIDE) 75-50 MG tablet Take 0.5 tablets by mouth daily. Resume in 1 week (Patient taking differently: Take 0.5 tablets by mouth daily.)    . Wheat Dextrin (BENEFIBER DRINK MIX) PACK Take 4 g by mouth at bedtime. (Patient taking differently: Take 4 g by mouth daily.)     No current facility-administered medications for this visit.     Physical Exam  Blood pressure 137/71, pulse 90, height 5' (1.524 m), weight 186 lb 2 oz (84.4 kg).  Constitutional: overall normal hygiene, normal nutrition, well developed, normal grooming, normal body habitus. Assistive device:none  Musculoskeletal: gait and station Limp right, muscle tone and strength are normal, no tremors or atrophy is present.  .  Neurological: coordination overall normal.  Deep tendon reflex/nerve stretch intact.  Sensation normal.  Cranial nerves II-XII intact.   Skin:   Normal overall no scars, lesions, ulcers or rashes. No psoriasis.  Psychiatric: Alert and oriented x 3.  Recent memory intact, remote memory unclear.  Normal mood and affect. Well groomed.  Good eye contact.  Cardiovascular: overall no swelling, no varicosities, no edema bilaterally, normal temperatures of the legs and arms, no clubbing, cyanosis and good capillary refill.  Lymphatic: palpation is normal.  Right knee with pain, effusion, crepitus, positive medial [place], limp right.  Left heel with center of plantar area tender, no redness.  All other systems reviewed and are negative   The patient has been educated about the nature of the problem(s) and counseled on treatment options.  The patient appeared to understand what I have discussed and is in agreement with it.  Encounter Diagnoses  Name Primary?  . Complex tear of lateral meniscus of right knee as current injury, subsequent encounter Yes  .  Chronic heel pain, left     PLAN Call if any problems.  Precautions discussed.  Continue current medications.   Return to clinic to see Dr. Aline Brochure about arthroscopy of knee on the right.   Electronically Signed Sanjuana Kava, MD 3/31/202211:05 AM

## 2021-03-20 NOTE — Patient Instructions (Signed)
Out of work another month

## 2021-04-16 ENCOUNTER — Ambulatory Visit: Payer: Medicare Other | Admitting: Orthopedic Surgery

## 2021-04-16 ENCOUNTER — Other Ambulatory Visit: Payer: Self-pay | Admitting: Orthopedic Surgery

## 2021-04-16 ENCOUNTER — Encounter: Payer: Self-pay | Admitting: Orthopedic Surgery

## 2021-04-16 ENCOUNTER — Other Ambulatory Visit: Payer: Self-pay

## 2021-04-16 VITALS — BP 147/94 | HR 85 | Ht 60.0 in | Wt 186.0 lb

## 2021-04-16 DIAGNOSIS — S83271D Complex tear of lateral meniscus, current injury, right knee, subsequent encounter: Secondary | ICD-10-CM | POA: Diagnosis not present

## 2021-04-16 NOTE — Progress Notes (Signed)
Chief Complaint  Patient presents with  . Knee Problem    Right lateral meniscus tear, to discuss surgery   65 year old female has finally decided to go ahead and have her right knee scoped she has a lateral meniscal tear with arthritis she has limited range of motion which is chronic  MRI  IMPRESSION: Dominant finding is osteoarthritis about the knee which is severe in the lateral compartment.  Severe complex tearing throughout the lateral meniscus.   Electronically Signed   By: Inge Rise M.D.   On: 02/19/2021 08:55  History and physical incorporated by reference to be done at a later date  Encounter Diagnosis  Name Primary?  . Complex tear of lateral meniscus of right knee as current injury, subsequent encounter Yes

## 2021-04-16 NOTE — Patient Instructions (Signed)
Meniscus Injury, Arthroscopy   Arthroscopy is a surgical procedure that involves the use of a small scope that has a camera and surgical instruments on the end (arthroscope). An arthroscope can be used to repair your meniscus injury.  LET YOUR HEALTH CARE PROVIDER KNOW ABOUT:  Any allergies you have.  All medicines you are taking, including vitamins, herbs, eyedrops, creams, and over-the-counter medicines.  Any recent colds or infections you have had or currently have.  Previous problems you or members of your family have had with the use of anesthetics.  Any blood disorders or blood clotting problems you have.  Previous surgeries you have had.  Medical conditions you have. RISKS AND COMPLICATIONS Generally, this is a safe procedure. However, as with any procedure, problems can occur. Possible problems include:  Damage to nerves or blood vessels.  Excess bleeding.  Blood clots.  Infection. BEFORE THE PROCEDURE  Do not eat or drink for 6-8 hours before the procedure.  Take medicines as directed by your surgeon. Ask your surgeon about changing or stopping your regular medicines.  You may have lab tests the morning of surgery. PROCEDURE  You will be given one of the following:   A medicine that numbs the area (local anesthesia).  A medicine that makes you go to sleep (general anesthesia).  A medicine injected into your spine that numbs your body below the waist (spinal anesthesia). Most often, several small cuts (incisions) are made in the knee. The arthroscope and instruments go into the incisions to repair the damage. The torn portion of the meniscus is removed.   AFTER THE PROCEDURE  You will be taken to the recovery area where your progress will be monitored. When you are awake, stable, and taking fluids without complications, you will be allowed to go home. This is usually the same day. A torn or stretched ligament (ligament sprain) may take 6-8 weeks to heal.   It  takes about the 4-6 WEEKS if your surgeon removed a torn meniscus.  A repaired meniscus may require 6-12 weeks of recovery time.  A torn ligament needing reconstructive surgery may take 6-12 months to heal fully.   This information is not intended to replace advice given to you by your health care provider. Make sure you discuss any questions you have with your health care provider. You have decided to proceed with operative arthroscopy of the knee. You have decided not to continue with nonoperative measures such as but not limited to oral medication, weight loss, activity modification, physical therapy, bracing, or injection.  We will perform operative arthroscopy of the knee. Some of the risks associated with arthroscopic surgery of the knee include but are not limited to Bleeding Infection Swelling Stiffness Blood clot Pain Need for knee replacement surgery    In compliance with recent [place] law in federal regulation regarding opioid use and abuse and addiction, we will taper (stop) opioid medication after 2 weeks.  If you're not comfortable with these risks and would like to continue with nonoperative treatment please let Dr. Tayva Easterday know prior to your surgery. 

## 2021-04-21 ENCOUNTER — Encounter: Payer: Self-pay | Admitting: Orthopedic Surgery

## 2021-04-24 NOTE — Patient Instructions (Signed)
[name]  04/24/2021     @PREFPERIOPPHARMACY @   Your procedure is scheduled on  04/29/2021.   Report to [facility] at  [phone]  A.M.   Call this number if you have problems the morning of surgery:  [phone]   Remember:  Do not eat or drink after midnight.                         Take these medicines the morning of surgery with A SIP OF WATER  Oxycodone (if needed).  Use your inhaler before you come and bring your rescue inhaler with you.       Do not wear jewelry, make-up or nail polish.  Do not wear lotions, powders, or perfumes, or deodorant.  Do not shave 48 hours prior to surgery.  Men may shave face and neck.  Do not bring valuables to the hospital.  [facility] is not responsible for any belongings or valuables.    Place clean sheets on your bed the night before your procedure and DO NOT sleep with pets this night.  Shower with CHG the night before and the morning of your procedure. DO NOT put CHG on your face, hair or genitals.  After each shower, dry off with a clean towel, put on clean, comfortable clothes and brush your teeth.    Contacts, dentures or bridgework may not be worn into surgery.  Leave your suitcase in the car.  After surgery it may be brought to your room.  For patients admitted to the hospital, discharge time will be determined by your treatment team.  Patients discharged the day of surgery will not be allowed to drive home and must have someone with them for 24 hours.    Special instructions:  DO NOT smoke tobacco or vape for 24 hours before your procedure.  Please read over the following fact sheets that you were given. Coughing and Deep Breathing, Surgical Site Infection Prevention, Anesthesia Post-op Instructions and Care and Recovery After Surgery        Arthroscopic Knee Ligament Repair, Care After This sheet gives you information about how to care for yourself after your procedure. Your health care provider may  also give you more specific instructions. If you have problems or questions, contact your health care provider. What can I expect after the procedure? After the procedure, it is common to have:  Soreness or pain in your knee.  Bruising and swelling on your knee, calf, and ankle for 3-4 days.  A small amount of fluid coming from the incisions. Follow these instructions at home: Medicines  Take over-the-counter and prescription medicines only as told by your health care provider.  Ask your health care provider if the medicine prescribed to you: ? Requires you to avoid driving or using machinery. ? Can cause constipation. You may need to take these actions to prevent or treat constipation:  Drink enough fluid to keep your urine pale yellow.  Take over-the-counter or prescription medicines.  Eat foods that are high in fiber, such as beans, whole grains, and fresh fruits and vegetables.  Limit foods that are high in fat and processed sugars, such as fried or sweet foods. If you have a brace or immobilizer:  Wear it as told by your health care provider. Remove it only as told by your health care provider.  Loosen it if your toes tingle, become numb, or turn cold and blue.  Keep it clean and dry.  Ask your health care provider when it is safe to drive. Bathing  Do not take baths, swim, or use a hot tub until your health care provider approves.  Keep your bandage (dressing) dry until your health care provider says that it can be removed.  If the brace or immobilizer is not waterproof: ? Do not let it get wet. ? Cover it with a watertight covering when you take a bath or shower. Incision care  Follow instructions from your health care provider about how to take care of your incisions. Make sure you: ? Wash your hands with soap and water for at least 20 seconds before and after you change your dressing. If soap and water are not available, use hand sanitizer. ? Change your  dressing as told by your health care provider. ? Leave stitches (sutures), skin glue, or adhesive strips in place. These skin closures may need to stay in place for 2 weeks or longer. If adhesive strip edges start to loosen and curl up, you may trim the loose edges. Do not remove adhesive strips completely unless your health care provider tells you to do that.  Check your incision areas every day for signs of infection. Check for: ? Redness. ? More swelling or pain. ? Blood or more fluid. ? Warmth. ? Pus or a bad smell.   Managing pain, stiffness, and swelling  If directed, put ice on the affected area. To do this: ? If you have a removable brace or immobilizer, remove it as told by your health care provider. ? Put ice in a plastic bag. ? Place a towel between your skin and the bag. ? Leave the ice on for 20 minutes, 2-3 times a day. ? Remove the ice if your skin turns bright red. This is very important. If you cannot feel pain, heat, or cold, you have a greater risk of damage to the area.  Move your toes often to reduce stiffness and swelling.  Raise (elevate) the injured area above the level of your heart while you are sitting or lying down.   Activity  Do not use your knee to support your body weight until your health care provider says that you can. Use crutches or other devices as told by your health care provider.  Do physical therapy exercises as told by your health care provider. Physical therapy will help you regain movement and strength in your knee.  Follow instructions from your health care provider about: ? When you may start motion exercises. ? When you may start riding a stationary bike and doing other low-impact activities. ? When you may start to jog and do other high-impact activities.  Do not lift anything that is heavier than 10 lb (4.5 kg), or the limit that you are told, until your health care provider says that it is safe.  Ask your health care provider what  activities are safe for you. General instructions  Do not use any products that contain nicotine or tobacco, such as cigarettes, e-cigarettes, and chewing tobacco. These can delay healing. If you need help quitting, ask your health care provider.  Wear compression stockings as told by your health care provider. These stockings help to prevent blood clots and reduce swelling in your legs.  Keep all follow-up visits. This is important. Contact a health care provider if:  You have any of these signs of infection: ? Redness around an incision. ? Blood or more fluid coming from an  incision. ? Warmth coming from an incision. ? Pus or a bad smell coming from an incision. ? More swelling or pain in your knee. ? A fever or chills.  You have pain that does not get better with medicine.  Your incision opens up. Get help right away if:  You have trouble breathing.  You have chest pain.  You have increased pain or swelling in your calf or at the back of your knee.  You have numbness and tingling near the knee joint or in the foot, ankle, or toes.  You notice that your foot or toes look darker than normal or are cooler than normal. These symptoms may represent a serious problem that is an emergency. Do not wait to see if the symptoms will go away. Get medical help right away. Call your local emergency services (911 in the [place] Do not drive yourself to the hospital. Summary  After the procedure, it is common to have knee pain with bruising and swelling on your knee, calf, and ankle.  Icing your knee and raising your leg above the level of your heart will help control the pain and swelling.  Do physical therapy exercises as told by your health care provider. Physical therapy will help you regain movement and strength in your knee. This information is not intended to replace advice given to you by your health care provider. Make sure you discuss any questions you have with your health care  provider. Document Revised: 05/06/2020 Document Reviewed: 05/06/2020 Elsevier Patient Education  [place] Anesthesia, Adult, Care After This sheet gives you information about how to care for yourself after your procedure. Your health care provider may also give you more specific instructions. If you have problems or questions, contact your health care provider. What can I expect after the procedure? After the procedure, the following side effects are common:  Pain or discomfort at the IV site.  Nausea.  Vomiting.  Sore throat.  Trouble concentrating.  Feeling cold or chills.  Feeling weak or tired.  Sleepiness and fatigue.  Soreness and body aches. These side effects can affect parts of the body that were not involved in surgery. Follow these instructions at home: For the time period you were told by your health care provider:  Rest.  Do not participate in activities where you could fall or become injured.  Do not drive or use machinery.  Do not drink alcohol.  Do not take sleeping pills or medicines that cause drowsiness.  Do not make important decisions or sign legal documents.  Do not take care of children on your own.   Eating and drinking  Follow any instructions from your health care provider about eating or drinking restrictions.  When you feel hungry, start by eating small amounts of foods that are soft and easy to digest (bland), such as toast. Gradually return to your regular diet.  Drink enough fluid to keep your urine pale yellow.  If you vomit, rehydrate by drinking water, juice, or clear broth. General instructions  If you have sleep apnea, surgery and certain medicines can increase your risk for breathing problems. Follow instructions from your health care provider about wearing your sleep device: ? Anytime you are sleeping, including during daytime naps. ? While taking prescription pain medicines, sleeping medicines, or  medicines that make you drowsy.  Have a responsible adult stay with you for the time you are told. It is important to have someone help care for you until you  are awake and alert.  Return to your normal activities as told by your health care provider. Ask your health care provider what activities are safe for you.  Take over-the-counter and prescription medicines only as told by your health care provider.  If you smoke, do not smoke without supervision.  Keep all follow-up visits as told by your health care provider. This is important. Contact a health care provider if:  You have nausea or vomiting that does not get better with medicine.  You cannot eat or drink without vomiting.  You have pain that does not get better with medicine.  You are unable to pass urine.  You develop a skin rash.  You have a fever.  You have redness around your IV site that gets worse. Get help right away if:  You have difficulty breathing.  You have chest pain.  You have blood in your urine or stool, or you vomit blood. Summary  After the procedure, it is common to have a sore throat or nausea. It is also common to feel tired.  Have a responsible adult stay with you for the time you are told. It is important to have someone help care for you until you are awake and alert.  When you feel hungry, start by eating small amounts of foods that are soft and easy to digest (bland), such as toast. Gradually return to your regular diet.  Drink enough fluid to keep your urine pale yellow.  Return to your normal activities as told by your health care provider. Ask your health care provider what activities are safe for you. This information is not intended to replace advice given to you by your health care provider. Make sure you discuss any questions you have with your health care provider. Document Revised: 08/22/2020 Document Reviewed: 03/21/2020 Elsevier Patient Education  2021 [employer].

## 2021-04-25 ENCOUNTER — Ambulatory Visit (HOSPITAL_COMMUNITY)
Admission: RE | Admit: 2021-04-25 | Discharge: 2021-04-25 | Disposition: A | Discharge status: Home / Self Care | Payer: Medicare Other | Source: Ambulatory Visit | Attending: Orthopedic Surgery | Admitting: Orthopedic Surgery

## 2021-04-25 ENCOUNTER — Encounter (HOSPITAL_COMMUNITY): Payer: Self-pay

## 2021-04-25 ENCOUNTER — Encounter (HOSPITAL_BASED_OUTPATIENT_CLINIC_OR_DEPARTMENT_OTHER)
Admission: RE | Admit: 2021-04-25 | Discharge: 2021-04-25 | Disposition: A | Discharge status: Home / Self Care | Payer: Medicare Other | Source: Ambulatory Visit | Attending: Orthopedic Surgery | Admitting: Orthopedic Surgery

## 2021-04-25 ENCOUNTER — Other Ambulatory Visit (HOSPITAL_COMMUNITY)
Admission: RE | Admit: 2021-04-25 | Discharge: 2021-04-25 | Disposition: A | Discharge status: Home / Self Care | Payer: Medicare Other | Source: Ambulatory Visit | Attending: Orthopedic Surgery | Admitting: Orthopedic Surgery

## 2021-04-25 ENCOUNTER — Other Ambulatory Visit: Payer: Self-pay

## 2021-04-25 DIAGNOSIS — D860 Sarcoidosis of lung: Secondary | ICD-10-CM | POA: Insufficient documentation

## 2021-04-25 DIAGNOSIS — Z01818 Encounter for other preprocedural examination: Secondary | ICD-10-CM | POA: Insufficient documentation

## 2021-04-25 DIAGNOSIS — Z20822 Contact with and (suspected) exposure to covid-19: Secondary | ICD-10-CM | POA: Insufficient documentation

## 2021-04-25 LAB — CBC WITH DIFFERENTIAL/PLATELET
Abs Immature Granulocytes: 0.03 10*3/uL (ref 0.00–0.07)
Basophils Absolute: 0.1 10*3/uL (ref 0.0–0.1)
Basophils Relative: 1 %
Eosinophils Absolute: 0.5 10*3/uL (ref 0.0–0.5)
Eosinophils Relative: 8 %
HCT: 41 % (ref 36.0–46.0)
Hemoglobin: 12.9 g/dL (ref 12.0–15.0)
Immature Granulocytes: 1 %
Lymphocytes Relative: 34 %
Lymphs Abs: 2.3 10*3/uL (ref 0.7–4.0)
MCH: 28 pg (ref 26.0–34.0)
MCHC: 31.5 g/dL (ref 30.0–36.0)
MCV: 88.9 fL (ref 80.0–100.0)
Monocytes Absolute: 0.7 10*3/uL (ref 0.1–1.0)
Monocytes Relative: 11 %
Neutro Abs: 3 10*3/uL (ref 1.7–7.7)
Neutrophils Relative %: 45 %
Platelets: 321 10*3/uL (ref 150–400)
RBC: 4.61 MIL/uL (ref 3.87–5.11)
RDW: 14.3 % (ref 11.5–15.5)
WBC: 6.6 10*3/uL (ref 4.0–10.5)
nRBC: 0 % (ref 0.0–0.2)

## 2021-04-25 LAB — BASIC METABOLIC PANEL
Anion gap: 7 (ref 5–15)
BUN: 18 mg/dL (ref 8–23)
CO2: 25 mmol/L (ref 22–32)
Calcium: 10.9 mg/dL — ABNORMAL HIGH (ref 8.9–10.3)
Chloride: 106 mmol/L (ref 98–111)
Creatinine, Ser: 0.97 mg/dL (ref 0.44–1.00)
GFR, Estimated: >60 mL/min (ref >60)
Glucose, Bld: 103 mg/dL — ABNORMAL HIGH (ref 70–99)
Potassium: 4.1 mmol/L (ref 3.5–5.1)
Sodium: 138 mmol/L (ref 135–145)

## 2021-04-25 LAB — SARS CORONAVIRUS 2 (TAT 6-24 HRS): SARS Coronavirus 2: NEGATIVE

## 2021-04-28 NOTE — H&P (Signed)
Chief Complaint  Patient presents with  . Knee Pain      L/hurting about the same.      HPI:  [name] has a 3-1/2 to 4-year history of pain in her right knee.  She is 65 years old.  She tried to avoid having surgery but her knee has gotten worse and is interfering with her activities of daily living and her lifestyle  She has lateral knee pain in the knee frequently gives way.  Her nonoperative treatment included injection and oral opioid as well as ibuprofen She had an MRI and it shows arthritis in the lateral compartment with a torn lateral meniscus        Past Medical History:  Diagnosis Date  . Anxiety    . Asthma    . Chronic knee pain    . Diverticulitis    . Headache    . Hypertension    . Seasonal allergies    . Vertigo      Body mass index is 36.13 kg/m.        Past Surgical History:  Procedure Laterality Date  . ABDOMINAL HYSTERECTOMY      . ABLATION COLPOCLESIS      . CHOLECYSTECTOMY      . COLONOSCOPY WITH PROPOFOL N/A 10/04/2017    Procedure: COLONOSCOPY WITH PROPOFOL;  Surgeon: Rogene Houston, MD;  Location: [facility];  Service: Endoscopy;  Laterality: N/A;  1:15  . POLYPECTOMY   10/04/2017    Procedure: POLYPECTOMY;  Surgeon: Rogene Houston, MD;  Location: [facility];  Service: Endoscopy;;  sigmoid colon polyp      No orders of the defined types were placed in this encounter.     Current Outpatient Medications:  .  albuterol (PROVENTIL HFA;VENTOLIN HFA) 108 (90 Base) MCG/ACT inhaler, Inhale 2 puffs into the lungs every 6 (six) hours as needed for wheezing or shortness of breath. , Disp: , Rfl:  .  ALPRAZolam (XANAX) 1 MG tablet, Take 2 mg by mouth at bedtime., Disp: , Rfl:  .  docusate sodium (COLACE) 100 MG capsule, Take 1 capsule (100 mg total) by mouth every 12 (twelve) hours., Disp: 20 capsule, Rfl: 0 .  fluorometholone (FML) 0.1 % ophthalmic suspension, Place 1 drop into both eyes 4 (four) times daily. , Disp: , Rfl:  .  fluticasone  (FLONASE) 50 MCG/ACT nasal spray, Place 1-2 sprays into both nostrils daily as needed for allergies or rhinitis. , Disp: , Rfl:  .  metroNIDAZOLE (FLAGYL) 500 MG tablet, Take 1 tablet (500 mg total) by mouth 3 (three) times daily., Disp: 21 tablet, Rfl: 0 .  Multiple Vitamin (MULTIVITAMIN WITH MINERALS) TABS tablet, Take 1 tablet by mouth daily., Disp: , Rfl:  .  oxyCODONE-acetaminophen (PERCOCET) 10-325 MG tablet, Take 1 tablet by mouth 3 (three) times daily as needed for pain., Disp: , Rfl:  .  triamcinolone cream (KENALOG) 0.1 %, Apply 1 application topically 2 (two) times daily. Apply sparingly to neck rash for up to 10 days. (Patient taking differently: Apply 1 application topically 2 (two) times daily as needed (for rash).), Disp: 30 g, Rfl: 0 .  triamterene-hydrochlorothiazide (MAXZIDE) 75-50 MG tablet, Take 0.5 tablets by mouth daily. Resume in 1 week (Patient taking differently: Take 0.5 tablets by mouth daily.), Disp: , Rfl:  .  Wheat Dextrin (BENEFIBER DRINK MIX) PACK, Take 4 g by mouth at bedtime. (Patient taking differently: Take 4 g by mouth daily.), Disp: , Rfl:  BP 134/83   Pulse 75   Ht 5' (1.524 m)   Wt 185 lb (83.9 kg)   BMI 36.13 kg/m    Physical Exam Constitutional:      General: She is not in acute distress.    Appearance: Normal appearance. She is well-developed.     Comments: Well developed, well nourished Normal grooming and hygiene     Cardiovascular:     Comments: No peripheral edema Musculoskeletal:     Comments:  Right knee the skin is intact and normal the lateral joint line is tender she has decreased range of motion but can bend the knee past 90 degrees all the ligaments feel stable and the muscle tone is normal  Skin:    General: Skin is warm and dry.     Capillary Refill: Capillary refill takes less than 2 seconds.  Neurological:     Mental Status: She is alert and oriented to person, place, and time.     Sensory: No sensory deficit.      Coordination: Coordination normal.     Gait: Gait normal.     Deep Tendon Reflexes: Reflexes are normal and symmetric.  Psychiatric:        Mood and Affect: Mood normal.        Behavior: Behavior normal.        Thought Content: Thought content normal.        Judgment: Judgment normal.     Comments: Affect normal             Encounter Diagnoses  Name Primary?  . Complex tear of lateral meniscus of right knee as current injury, initial encounter Yes  . Primary localized osteoarthritis of knee-RIGHT      [name] has a very difficult condition to treat.  She has arthritis and torn cartilage in the same compartment.  She has a little bit of valgus deformity on her x-ray.   She is not ready for knee replacement so we decided to go with something smaller with the understanding that she was still have knee pain and her range of motion deficit is very concerning and may not improve and is unlikely to improve although we will try with therapy afterwards   Plan [facility]ARK LATERAL MENISCUS

## 2021-04-29 ENCOUNTER — Encounter (HOSPITAL_COMMUNITY)
Admission: RE | Disposition: A | Discharge status: Home / Self Care | Payer: Self-pay | Source: Home / Self Care | Attending: Orthopedic Surgery

## 2021-04-29 ENCOUNTER — Ambulatory Visit (HOSPITAL_COMMUNITY): Payer: Medicare Other | Admitting: Anesthesiology

## 2021-04-29 ENCOUNTER — Ambulatory Visit (HOSPITAL_COMMUNITY)
Admission: RE | Admit: 2021-04-29 | Discharge: 2021-04-29 | Disposition: A | Discharge status: Home / Self Care | Payer: Medicare Other | Attending: Orthopedic Surgery | Admitting: Orthopedic Surgery

## 2021-04-29 ENCOUNTER — Other Ambulatory Visit: Payer: Self-pay

## 2021-04-29 ENCOUNTER — Encounter (HOSPITAL_COMMUNITY): Payer: Self-pay | Admitting: Orthopedic Surgery

## 2021-04-29 DIAGNOSIS — M1711 Unilateral primary osteoarthritis, right knee: Secondary | ICD-10-CM | POA: Diagnosis not present

## 2021-04-29 DIAGNOSIS — M21061 Valgus deformity, not elsewhere classified, right knee: Secondary | ICD-10-CM | POA: Insufficient documentation

## 2021-04-29 DIAGNOSIS — M23200 Derangement of unspecified lateral meniscus due to old tear or injury, right knee: Secondary | ICD-10-CM

## 2021-04-29 DIAGNOSIS — Z79899 Other long term (current) drug therapy: Secondary | ICD-10-CM | POA: Diagnosis not present

## 2021-04-29 DIAGNOSIS — S83271A Complex tear of lateral meniscus, current injury, right knee, initial encounter: Secondary | ICD-10-CM | POA: Diagnosis not present

## 2021-04-29 DIAGNOSIS — X58XXXA Exposure to other specified factors, initial encounter: Secondary | ICD-10-CM | POA: Insufficient documentation

## 2021-04-29 HISTORY — PX: KNEE ARTHROSCOPY WITH LATERAL MENISECTOMY: SHX6193

## 2021-04-29 SURGERY — ARTHROSCOPY, KNEE, WITH LATERAL MENISCECTOMY
Anesthesia: General | Site: Knee | Laterality: Right

## 2021-04-29 MED ORDER — ORAL CARE MOUTH RINSE: 15.0000 mL | Freq: Once | OROMUCOSAL | Status: AC

## 2021-04-29 MED ORDER — MIDAZOLAM HCL 5 MG/5ML IJ SOLN
INTRAMUSCULAR | Status: DC | PRN
  Administered 2021-04-29: 2 mg via INTRAVENOUS

## 2021-04-29 MED ORDER — FENTANYL CITRATE (PF) 250 MCG/5ML IJ SOLN
INTRAMUSCULAR | Status: AC
  Filled 2021-04-29: qty 5

## 2021-04-29 MED ORDER — ONDANSETRON HCL 4 MG/2ML IJ SOLN: 4.0000 mg | Freq: Once | INTRAMUSCULAR | Status: DC | PRN

## 2021-04-29 MED ORDER — IBUPROFEN 400 MG PO TABS
400.0000 mg | ORAL_TABLET | Freq: Once | ORAL | Status: AC
  Administered 2021-04-29: 400 mg via ORAL
  Filled 2021-04-29: qty 1

## 2021-04-29 MED ORDER — LIDOCAINE HCL (CARDIAC) PF 50 MG/5ML IV SOSY
PREFILLED_SYRINGE | INTRAVENOUS | Status: DC | PRN
  Administered 2021-04-29: 75 mg via INTRAVENOUS

## 2021-04-29 MED ORDER — BUPIVACAINE-EPINEPHRINE (PF) 0.5% -1:200000 IJ SOLN
INTRAMUSCULAR | Status: DC | PRN
  Administered 2021-04-29: 60 mL via PERINEURAL

## 2021-04-29 MED ORDER — IBUPROFEN 800 MG PO TABS: 800.0000 mg | ORAL_TABLET | Freq: Three times a day (TID) | ORAL | 1 refills | Status: DC | PRN

## 2021-04-29 MED ORDER — BUPIVACAINE-EPINEPHRINE (PF) 0.5% -1:200000 IJ SOLN
INTRAMUSCULAR | Status: AC
  Filled 2021-04-29: qty 60

## 2021-04-29 MED ORDER — FENTANYL CITRATE (PF) 100 MCG/2ML IJ SOLN
INTRAMUSCULAR | Status: DC | PRN
  Administered 2021-04-29: 25 ug via INTRAVENOUS
  Administered 2021-04-29: 50 ug via INTRAVENOUS
  Administered 2021-04-29: 25 ug via INTRAVENOUS
  Administered 2021-04-29: 50 ug via INTRAVENOUS

## 2021-04-29 MED ORDER — DEXAMETHASONE SODIUM PHOSPHATE 10 MG/ML IJ SOLN
INTRAMUSCULAR | Status: AC
  Filled 2021-04-29: qty 1

## 2021-04-29 MED ORDER — ONDANSETRON HCL 4 MG/2ML IJ SOLN
INTRAMUSCULAR | Status: AC
  Filled 2021-04-29: qty 2

## 2021-04-29 MED ORDER — HYDROMORPHONE HCL 1 MG/ML IJ SOLN: 0.2500 mg | INTRAMUSCULAR | Status: DC | PRN

## 2021-04-29 MED ORDER — ONDANSETRON HCL 4 MG/2ML IJ SOLN
INTRAMUSCULAR | Status: DC | PRN
  Administered 2021-04-29: 4 mg via INTRAVENOUS

## 2021-04-29 MED ORDER — MIDAZOLAM HCL 2 MG/2ML IJ SOLN
INTRAMUSCULAR | Status: AC
  Filled 2021-04-29: qty 2

## 2021-04-29 MED ORDER — ACETAMINOPHEN 500 MG PO TABS
500.0000 mg | ORAL_TABLET | Freq: Once | ORAL | Status: AC
  Administered 2021-04-29: 500 mg via ORAL
  Filled 2021-04-29: qty 1

## 2021-04-29 MED ORDER — EPINEPHRINE PF 1 MG/ML IJ SOLN
INTRAMUSCULAR | Status: AC
  Filled 2021-04-29: qty 5

## 2021-04-29 MED ORDER — CHLORHEXIDINE GLUCONATE 0.12 % MT SOLN
15.0000 mL | Freq: Once | OROMUCOSAL | Status: AC
  Administered 2021-04-29: 15 mL via OROMUCOSAL

## 2021-04-29 MED ORDER — LIDOCAINE HCL (PF) 2 % IJ SOLN
INTRAMUSCULAR | Status: AC
  Filled 2021-04-29: qty 5

## 2021-04-29 MED ORDER — LACTATED RINGERS IV SOLN: INTRAVENOUS | Status: DC

## 2021-04-29 MED ORDER — PROPOFOL 10 MG/ML IV BOLUS
INTRAVENOUS | Status: AC
  Filled 2021-04-29: qty 40

## 2021-04-29 MED ORDER — ONDANSETRON HCL 4 MG/2ML IJ SOLN
4.0000 mg | Freq: Once | INTRAMUSCULAR | Status: AC
  Administered 2021-04-29: 4 mg via INTRAVENOUS
  Filled 2021-04-29: qty 2

## 2021-04-29 MED ORDER — DEXAMETHASONE SODIUM PHOSPHATE 10 MG/ML IJ SOLN
INTRAMUSCULAR | Status: DC | PRN
  Administered 2021-04-29: 8 mg via INTRAVENOUS

## 2021-04-29 MED ORDER — PROPOFOL 10 MG/ML IV BOLUS
INTRAVENOUS | Status: DC | PRN
  Administered 2021-04-29: 160 mg via INTRAVENOUS

## 2021-04-29 MED ORDER — CEFAZOLIN SODIUM-DEXTROSE 2-4 GM/100ML-% IV SOLN
2.0000 g | INTRAVENOUS | Status: AC
  Administered 2021-04-29: 2 g via INTRAVENOUS
  Filled 2021-04-29: qty 100

## 2021-04-29 MED ORDER — SODIUM CHLORIDE 0.9 % IR SOLN
Status: DC | PRN
  Administered 2021-04-29 (×2): 3000 mL

## 2021-04-29 SURGICAL SUPPLY — 49 items
ABLATOR ASPIRATE 50D MULTI-PRT (SURGICAL WAND) ×2 IMPLANT
APL PRP 5X4 STRL LF DISP 70% (MISCELLANEOUS) ×1
APL PRP STRL LF DISP 70% ISPRP (MISCELLANEOUS) ×1
APPLICATOR CHLORAPREP 3ML ORNG (MISCELLANEOUS) ×2 IMPLANT
BANDAGE ELASTIC 6 VELCRO NS (GAUZE/BANDAGES/DRESSINGS) ×2 IMPLANT
BIT DRILL 2.0MX128MM (BIT) IMPLANT
BLADE SHAVER TORPEDO 4X13 (MISCELLANEOUS) ×2 IMPLANT
BLADE SURG SZ11 CARB STEEL (BLADE) ×2 IMPLANT
BNDG CMPR STD VLCR NS LF 5.8X6 (GAUZE/BANDAGES/DRESSINGS) ×1
BNDG ELASTIC 6X5.8 VLCR NS LF (GAUZE/BANDAGES/DRESSINGS) ×2 IMPLANT
CHLORAPREP W/TINT 26 (MISCELLANEOUS) ×2 IMPLANT
CLOTH BEACON ORANGE TIMEOUT ST (SAFETY) ×2 IMPLANT
COOLER ICEMAN CLASSIC (MISCELLANEOUS) ×2 IMPLANT
COVER WAND RF STERILE (DRAPES) ×2 IMPLANT
CUFF TOURN SGL QUICK 34 (TOURNIQUET CUFF) ×2
CUFF TRNQT CYL 34X4.125X (TOURNIQUET CUFF) ×1 IMPLANT
DECANTER SPIKE VIAL GLASS SM (MISCELLANEOUS) ×4 IMPLANT
DISSECTOR 4.0MM X 13CM (MISCELLANEOUS) ×2 IMPLANT
DRAPE HALF SHEET 40X57 (DRAPES) IMPLANT
GAUZE 4X4 16PLY RFD (DISPOSABLE) ×2 IMPLANT
GAUZE SPONGE 4X4 12PLY STRL (GAUZE/BANDAGES/DRESSINGS) ×2 IMPLANT
GAUZE XEROFORM 5X9 LF (GAUZE/BANDAGES/DRESSINGS) ×2 IMPLANT
GLOVE SKINSENSE NS SZ8.0 LF (GLOVE) ×1
GLOVE SKINSENSE STRL SZ8.0 LF (GLOVE) ×1 IMPLANT
GLOVE SS N UNI LF 8.5 STRL (GLOVE) ×2 IMPLANT
GLOVE SURG UNDER POLY LF SZ7 (GLOVE) ×2 IMPLANT
GOWN STRL REUS W/TWL LRG LVL3 (GOWN DISPOSABLE) ×2 IMPLANT
GOWN STRL REUS W/TWL XL LVL3 (GOWN DISPOSABLE) ×2 IMPLANT
IV NS IRRIG 3000ML ARTHROMATIC (IV SOLUTION) ×6 IMPLANT
KIT BLADEGUARD II DBL (SET/KITS/TRAYS/PACK) ×2 IMPLANT
KIT TURNOVER CYSTO (KITS) ×2 IMPLANT
MANIFOLD NEPTUNE II (INSTRUMENTS) ×2 IMPLANT
MARKER SKIN DUAL TIP RULER LAB (MISCELLANEOUS) ×2 IMPLANT
NEEDLE HYPO 21X1.5 SAFETY (NEEDLE) ×2 IMPLANT
NEEDLE SPNL 18GX3.5 QUINCKE PK (NEEDLE) ×2 IMPLANT
PACK ARTHRO LIMB DRAPE STRL (MISCELLANEOUS) ×2 IMPLANT
PAD ABD 5X9 TENDERSORB (GAUZE/BANDAGES/DRESSINGS) ×2 IMPLANT
PAD ARMBOARD 7.5X6 YLW CONV (MISCELLANEOUS) ×2 IMPLANT
PAD COLD SHLDR WRAP-ON (PAD) ×2 IMPLANT
PAD FOR LEG HOLDER (MISCELLANEOUS) ×2 IMPLANT
PADDING CAST COTTON 6X4 STRL (CAST SUPPLIES) ×2 IMPLANT
PADDING WEBRIL 6 STERILE (GAUZE/BANDAGES/DRESSINGS) ×2 IMPLANT
PORT APPOLLO RF 90DEGREE MULTI (SURGICAL WAND) IMPLANT
SET ARTHROSCOPY INST (INSTRUMENTS) ×2 IMPLANT
SET BASIN LINEN APH (SET/KITS/TRAYS/PACK) ×2 IMPLANT
SUT ETHILON 3 0 FSL (SUTURE) ×2 IMPLANT
SYR 30ML LL (SYRINGE) ×2 IMPLANT
TUBE CONNECTING 12X1/4 (SUCTIONS) ×6 IMPLANT
TUBING IN/OUT FLOW W/MAIN PUMP (TUBING) ×2 IMPLANT

## 2021-04-29 NOTE — Anesthesia Postprocedure Evaluation (Addendum)
Anesthesia Post Note  Patient: [name]  Procedure(s) Performed: RIGHT KNEE ARTHROSCOPY WITH LATERAL MENISCECTOMY (Right Knee)  Patient location during evaluation: PACU Anesthesia Type: General Level of consciousness: awake and alert Pain management: pain level controlled Vital Signs Assessment: post-procedure vital signs reviewed and stable Respiratory status: spontaneous breathing Cardiovascular status: blood pressure returned to baseline and stable Postop Assessment: no apparent nausea or vomiting Anesthetic complications: no Comments: Late entry   No complications documented.   Last Vitals:  Vitals:   04/29/21 0945 04/29/21 1001  BP: (!) 152/85 (!) 142/75  Pulse: 84 81  Resp: 16 16  Temp:  36.5 C  SpO2: 95% 97%    Last Pain:  Vitals:   04/29/21 1001  TempSrc: Oral  PainSc: 0-No pain                 Harjas Biggins

## 2021-04-29 NOTE — Interval H&P Note (Signed)
History and Physical Interval Note:  04/29/2021 7:21 AM  [name]  has presented today for surgery, with the diagnosis of Torn Lateral Meniscus Right Knee.  The various methods of treatment have been discussed with the patient and family. After consideration of risks, benefits and other options for treatment, the patient has consented to  Procedure(s): KNEE ARTHROSCOPY WITH LATERAL MENISCECTOMY (Right) as a surgical intervention.  The patient's history has been reviewed, patient examined, no change in status, stable for surgery.  I have reviewed the patient's chart and labs.  Questions were answered to the patient's satisfaction.     Arther Abbott

## 2021-04-29 NOTE — Anesthesia Preprocedure Evaluation (Signed)
Anesthesia Evaluation  Patient identified by MRN, date of birth, ID band Patient awake    Reviewed: Allergy & Precautions, NPO status , Patient's Chart, lab work & pertinent test results  History of Anesthesia Complications Negative for: history of anesthetic complications  Airway Mallampati: I  TM Distance: >3 FB Neck ROM: Full    Dental  (+) Dental Advisory Given, Missing, Loose,    Pulmonary asthma , pneumonia, resolved,    Pulmonary exam normal breath sounds clear to auscultation       Cardiovascular Exercise Tolerance: Good hypertension, Pt. on medications Normal cardiovascular exam Rhythm:Regular Rate:Normal  25-Apr-2021 09:21:10 [facility] ROUTINE RECORD May 03, 1956 (16 yr) Female [place]: Loc:[id number] Technician: Test ind: Vent. rate 89 BPM PR interval 150 ms QRS duration 128 ms QT/QTcB 370/450 ms P-R-T axes 66 -74 63 Normal sinus rhythm Right bundle branch block Left anterior fascicular block Bifascicular block LVH Possible septal MI Abnormal ECG Confirmed by Dorris Carnes [phone]) on 04/25/2021 10:17:05 PM Referred by: Lemmie Evens   Neuro/Psych  Headaches, Anxiety    GI/Hepatic negative GI ROS, Neg liver ROS,   Endo/Other  negative endocrine ROS  Renal/GU Renal disease     Musculoskeletal negative musculoskeletal ROS (+)   Abdominal   Peds  Hematology negative hematology ROS (+)   Anesthesia Other Findings Vertigo   Reproductive/Obstetrics negative OB ROS                            Anesthesia Physical Anesthesia Plan  ASA: II  Anesthesia Plan: General   Post-op Pain Management:    Induction: Intravenous  PONV Risk Score and Plan: Ondansetron and Dexamethasone  Airway Management Planned: LMA  Additional Equipment:   Intra-op Plan:   Post-operative Plan: Extubation in OR  Informed Consent: I have reviewed the patients History and  Physical, chart, labs and discussed the procedure including the risks, benefits and alternatives for the proposed anesthesia with the patient or authorized representative who has indicated his/her understanding and acceptance.     Dental advisory given  Plan Discussed with: CRNA and Surgeon  Anesthesia Plan Comments:         Anesthesia Quick Evaluation

## 2021-04-29 NOTE — Transfer of Care (Signed)
Immediate Anesthesia Transfer of Care Note  Patient: [name]  Procedure(s) Performed: RIGHT KNEE ARTHROSCOPY WITH LATERAL MENISCECTOMY (Right Knee)  Patient Location: PACU  Anesthesia Type:General  Level of Consciousness: awake  Airway & Oxygen Therapy: Patient Spontanous Breathing  Post-op Assessment: Report given to RN  Post vital signs: Reviewed and stable  Last Vitals:  Vitals Value Taken Time  BP 152/69 04/29/21 0841  Temp    Pulse 86 04/29/21 0844  Resp 15 04/29/21 0844  SpO2 94 % 04/29/21 0844  Vitals shown include unvalidated device data.  Last Pain:  Vitals:   04/29/21 0646  TempSrc: Oral  PainSc: 2       Patients Stated Pain Goal: 10 (37/79/39 [id number])  Complications: No complications documented.

## 2021-04-29 NOTE — Anesthesia Procedure Notes (Signed)
Procedure Name: LMA Insertion Date/Time: 04/29/2021 7:41 AM Performed by: Ollen Bowl, CRNA Pre-anesthesia Checklist: Patient identified, Patient being monitored, Emergency Drugs available, Timeout performed and Suction available Patient Re-evaluated:Patient Re-evaluated prior to induction Oxygen Delivery Method: Circle System Utilized Preoxygenation: Pre-oxygenation with 100% oxygen Induction Type: IV induction Ventilation: Mask ventilation without difficulty LMA: LMA inserted LMA Size: 4.0 Number of attempts: 1 Placement Confirmation: positive ETCO2 and breath sounds checked- equal and bilateral

## 2021-04-29 NOTE — Op Note (Signed)
04/29/2021  8:32 AM  PATIENT:  [name]  65 y.o. female  PRE-OPERATIVE DIAGNOSIS:  Torn Lateral Meniscus Right Knee  POST-OPERATIVE DIAGNOSIS:  Torn Lateral Meniscus Right Knee, osteoarthritis right knee  Operative findings a quarter sized chondral lesion weightbearing surface medial femoral condyle with intact medial meniscus mild degenerative changes grade 1 tibial plateau on the medial side  Patellofemoral joint had diffuse chondromalacia grade 2 in the trochlea grade 4 in the lateral facet of the patella  Lateral compartment grade 4 changes on the tibial side complex tear involving the full circumference of the meniscus primarily in the body and anteriorly but some posterior root tearing as well.  Chondral lesion on the femoral condyle as well grade 2-3  PROCEDURE:  Procedure(s): RIGHT KNEE ARTHROSCOPY WITH LATERAL MENISCECTOMY (Right)  The patient was brought to the surgical suite for general anesthesia which was successful.  The right leg was placed in an arthroscopic leg holder the left leg was placed on a padded well leg holder  The right leg was prepped and draped sterilely and timeout was completed  A lateral portal was established and the scope was placed into the joint followed by diagnostic arthroscopy incorporating full circumferential tour of the joint  The findings are noted above  A spinal needle was used to make the medial portal and a straight shaver (dissector) was placed into the joint and the meniscus was debrided of all torn fibers until a stable rim was produced which was confirmed by probe  A chondroplasty of the tibial plateau and femoral condyle was performed only removing loose cartilage tissue from the joint surface that appeared to be vulnerable for flap tear  The joint was irrigated and the portals were closed with several 3-0 nylon sutures in interrupted fashion followed by injection of Marcaine with epinephrine 60 cc  Sterile dressings were  applied followed by Ace bandage and Cryo/Cuff which was activated  The patient was extubated and taken to recovery room in stable condition SURGEON:  Surgeon(s) and Role:    * Carole Civil, MD - Primary  PHYSICIAN ASSISTANT:   ASSISTANTS: none   ANESTHESIA:   general  EBL:  0 mL   BLOOD ADMINISTERED:none  DRAINS: none   LOCAL MEDICATIONS USED:  MARCAINE     SPECIMEN:  No Specimen  DISPOSITION OF SPECIMEN:  N/A  COUNTS:  YES  TOURNIQUET:  * Missing tourniquet times found for documented tourniquets in log: [id number] *  DICTATION: .Dragon Dictation  PLAN OF CARE: Discharge to home after PACU  PATIENT DISPOSITION:  PACU - hemodynamically stable.   Delay start of Pharmacological VTE agent (>24hrs) due to surgical blood loss or risk of bleeding: not applicable

## 2021-04-29 NOTE — Brief Op Note (Signed)
04/29/2021  8:32 AM  PATIENT:  [name]  64 y.o. female  PRE-OPERATIVE DIAGNOSIS:  Torn Lateral Meniscus Right Knee  POST-OPERATIVE DIAGNOSIS:  Torn Lateral Meniscus Right Knee, osteoarthritis right knee  Operative findings a quarter sized chondral lesion weightbearing surface medial femoral condyle with intact medial meniscus mild degenerative changes grade 1 tibial plateau on the medial side  Patellofemoral joint had diffuse chondromalacia grade 2 in the trochlea grade 4 in the lateral facet of the patella  Lateral compartment grade 4 changes on the tibial side complex tear involving the full circumference of the meniscus primarily in the body and anteriorly but some posterior root tearing as well.  Chondral lesion on the femoral condyle as well grade 2-3  PROCEDURE:  Procedure(s): RIGHT KNEE ARTHROSCOPY WITH LATERAL MENISCECTOMY (Right)  The patient was brought to the surgical suite for general anesthesia which was successful.  The right leg was placed in an arthroscopic leg holder the left leg was placed on a padded well leg holder  The right leg was prepped and draped sterilely and timeout was completed  A lateral portal was established and the scope was placed into the joint followed by diagnostic arthroscopy incorporating full circumferential tour of the joint  The findings are noted above  A spinal needle was used to make the medial portal and a straight shaver (dissector) was placed into the joint and the meniscus was debrided of all torn fibers until a stable rim was produced which was confirmed by probe  A chondroplasty of the tibial plateau and femoral condyle was performed only removing loose cartilage tissue from the joint surface that appeared to be vulnerable for flap tear  The joint was irrigated and the portals were closed with several 3-0 nylon sutures in interrupted fashion followed by injection of Marcaine with epinephrine 60 cc  Sterile dressings were  applied followed by Ace bandage and Cryo/Cuff which was activated  The patient was extubated and taken to recovery room in stable condition SURGEON:  Surgeon(s) and Role:    * Ignatz Deis E, MD - Primary  PHYSICIAN ASSISTANT:   ASSISTANTS: none   ANESTHESIA:   general  EBL:  0 mL   BLOOD ADMINISTERED:none  DRAINS: none   LOCAL MEDICATIONS USED:  MARCAINE     SPECIMEN:  No Specimen  DISPOSITION OF SPECIMEN:  N/A  COUNTS:  YES  TOURNIQUET:  * Missing tourniquet times found for documented tourniquets in log: [id number] *  DICTATION: .Dragon Dictation  PLAN OF CARE: Discharge to home after PACU  PATIENT DISPOSITION:  PACU - hemodynamically stable.   Delay start of Pharmacological VTE agent (>24hrs) due to surgical blood loss or risk of bleeding: not applicable  

## 2021-04-30 ENCOUNTER — Encounter (HOSPITAL_COMMUNITY): Payer: Self-pay | Admitting: Orthopedic Surgery

## 2021-05-06 DIAGNOSIS — Z9889 Other specified postprocedural states: Secondary | ICD-10-CM | POA: Insufficient documentation

## 2021-05-07 ENCOUNTER — Encounter: Payer: Self-pay | Admitting: Orthopedic Surgery

## 2021-05-07 ENCOUNTER — Other Ambulatory Visit: Payer: Self-pay

## 2021-05-07 ENCOUNTER — Ambulatory Visit (INDEPENDENT_AMBULATORY_CARE_PROVIDER_SITE_OTHER): Payer: Medicare Other | Admitting: Orthopedic Surgery

## 2021-05-07 DIAGNOSIS — Z9889 Other specified postprocedural states: Secondary | ICD-10-CM

## 2021-05-07 NOTE — Progress Notes (Signed)
POST OP   POD 8  POV 1   Chief Complaint  Patient presents with  . Post-op Follow-up    04/29/21 post op right knee scope    Encounter Diagnosis  Name Primary?  . s/p right knee arthroscopy lateral meniscectomy 04/29/21 Yes    [name] is doing well she has 90 degrees of flexion has near full extension  Sutures were removed no wound issues  Recommend ice, home exercises for quad strengthening, follow-up in 3 weeks    04/29/2021  8:32 AM  PATIENT:  [name]  65 y.o. female  PRE-OPERATIVE DIAGNOSIS:  Torn Lateral Meniscus Right Knee  POST-OPERATIVE DIAGNOSIS:  Torn Lateral Meniscus Right Knee, osteoarthritis right knee  Operative findings a quarter sized chondral lesion weightbearing surface medial femoral condyle with intact medial meniscus mild degenerative changes grade 1 tibial plateau on the medial side  Patellofemoral joint had diffuse chondromalacia grade 2 in the trochlea grade 4 in the lateral facet of the patella  Lateral compartment grade 4 changes on the tibial side complex tear involving the full circumference of the meniscus primarily in the body and anteriorly but some posterior root tearing as well.  Chondral lesion on the femoral condyle as well grade 2-3  PROCEDURE:  Procedure(s): RIGHT KNEE ARTHROSCOPY WITH LATERAL MENISCECTOMY (Right)

## 2021-05-23 ENCOUNTER — Telehealth: Payer: Self-pay | Admitting: Orthopedic Surgery

## 2021-05-23 NOTE — Telephone Encounter (Signed)
She had surgery 3 weeks ago she is doing everything right, but she is still having a lot of pain it stiffens up so bad she can hardly walk, and it is very swollen she can not hardly straighten it out  Please call her back at [phone]

## 2021-05-23 NOTE — Telephone Encounter (Signed)
I called her  to Ice it  Use the ice and ibuprofen she states she is.  Angela Nevin please work her in earlier than her scheduled appointment

## 2021-05-26 ENCOUNTER — Ambulatory Visit (INDEPENDENT_AMBULATORY_CARE_PROVIDER_SITE_OTHER): Payer: Medicare Other | Admitting: Orthopedic Surgery

## 2021-05-26 ENCOUNTER — Encounter: Payer: Self-pay | Admitting: Orthopedic Surgery

## 2021-05-26 ENCOUNTER — Other Ambulatory Visit: Payer: Self-pay

## 2021-05-26 VITALS — Ht 60.0 in | Wt 186.0 lb

## 2021-05-26 DIAGNOSIS — Z9889 Other specified postprocedural states: Secondary | ICD-10-CM

## 2021-05-26 MED ORDER — MELOXICAM 7.5 MG PO TABS: 7.5000 mg | ORAL_TABLET | Freq: Two times a day (BID) | ORAL | 5 refills | Status: DC

## 2021-05-26 NOTE — Progress Notes (Signed)
Post op   Chief Complaint  Patient presents with  . Follow-up    Recheck on right knee, DOS 04-29-21.   [name] comes in with increased postop pain after knee arthroscopy  She had quite a bit of arthritis in the lateral compartment but her main problem is probably her preop 3 times a day oxycodone 10 mg.  She says her knee hurts at night and it gets tight she says the ice hurts the knee  I suspect the ice is bothering the arthritis  Her knee looks fine there are no signs of infection she does not even have an effusion she had a preop limited flexion of only 90 degrees and postoperatively her active flexion is 60 degrees but I can passively bend to 90 although she says this is painful  I tried to explain to her what was going on she seemed to understand I started her on an anti-inflammatory as she said ibuprofen made her stomach hurt so we put her on meloxicam she is allergic to sulfa cannot take Celebrex  I told her to take her pain pills when needed and see me in 2 weeks

## 2021-05-26 NOTE — Patient Instructions (Signed)
Cancel weds  Reschedule for 2 weeks

## 2021-05-26 NOTE — Telephone Encounter (Signed)
Done

## 2021-05-28 ENCOUNTER — Ambulatory Visit: Payer: Medicare Other | Admitting: Orthopedic Surgery

## 2021-06-09 ENCOUNTER — Encounter: Payer: Self-pay | Admitting: Orthopedic Surgery

## 2021-06-09 ENCOUNTER — Ambulatory Visit (INDEPENDENT_AMBULATORY_CARE_PROVIDER_SITE_OTHER): Payer: Medicare Other | Admitting: Orthopedic Surgery

## 2021-06-09 ENCOUNTER — Other Ambulatory Visit: Payer: Self-pay

## 2021-06-09 DIAGNOSIS — Z9889 Other specified postprocedural states: Secondary | ICD-10-CM

## 2021-06-09 NOTE — Patient Instructions (Signed)
Continue Meloxicam Ice your knee and do the exercises daily

## 2021-06-09 NOTE — Progress Notes (Signed)
Chief Complaint  Patient presents with   Routine Post Op    DOS 04/29/21    Postop after knee arthroscopy patient had a postop increase in pain and swelling but was not taking her medicine.  Was advised to take her medicine and continue to ice and see me in 2 weeks  Operative findings a quarter sized chondral lesion weightbearing surface medial femoral condyle with intact medial meniscus mild degenerative changes grade 1 tibial plateau on the medial side   Patellofemoral joint had diffuse chondromalacia grade 2 in the trochlea grade 4 in the lateral facet of the patella   Lateral compartment grade 4 changes on the tibial side complex tear involving the full circumference of the meniscus primarily in the body and anteriorly but some posterior root tearing as well.  Chondral lesion on the femoral condyle as well grade 2-3   Last visit we started some meloxicam  Dysphagia slightly improved  Her pain is less on meloxicam it does make her little sleepy she complains of stiffness behind her knee when she is trying to straighten it I recommended passive extension continue ice and meloxicam walk for exercises  May return to work when she is ready  Follow-up 3 weeks

## 2021-06-30 ENCOUNTER — Ambulatory Visit (INDEPENDENT_AMBULATORY_CARE_PROVIDER_SITE_OTHER): Payer: Medicare Other | Admitting: Orthopedic Surgery

## 2021-06-30 ENCOUNTER — Encounter: Payer: Self-pay | Admitting: Orthopedic Surgery

## 2021-06-30 DIAGNOSIS — Z9889 Other specified postprocedural states: Secondary | ICD-10-CM | POA: Diagnosis not present

## 2021-06-30 DIAGNOSIS — M7662 Achilles tendinitis, left leg: Secondary | ICD-10-CM | POA: Diagnosis not present

## 2021-06-30 NOTE — Progress Notes (Signed)
POST OP FOLLOW UP   Encounter Diagnosis  Name Primary?   s/p right knee arthroscopy lateral meniscectomy 04/29/21 Yes     Chief Complaint  Patient presents with   Results    S/p right knee arthroscopy lateral meniscetomy DOS 04/29/21     [name] continues to be concerned about her knee.  She is having trouble going down the stairs she still having pain she did not regain any range of motion which was then iffy proposition prior to surgery  Also complains of chronic left heel pain previously been in a boot has received multiple injections but has not had physical therapy  Exam shows tenderness and tender bump over the posterior aspect of the Achilles with Achilles tendon tightness  Recommend physical therapy  Follow-up 6 weeks for ankle/Achilles tendon inflammation

## 2021-07-03 ENCOUNTER — Emergency Department (HOSPITAL_COMMUNITY)
Admission: EM | Admit: 2021-07-03 | Discharge: 2021-07-03 | Disposition: A | Discharge status: Home / Self Care | Payer: Medicare Other | Attending: Emergency Medicine | Admitting: Emergency Medicine

## 2021-07-03 ENCOUNTER — Emergency Department (HOSPITAL_COMMUNITY): Payer: Medicare Other

## 2021-07-03 ENCOUNTER — Encounter (HOSPITAL_COMMUNITY): Payer: Self-pay | Admitting: *Deleted

## 2021-07-03 DIAGNOSIS — I2510 Atherosclerotic heart disease of native coronary artery without angina pectoris: Secondary | ICD-10-CM | POA: Insufficient documentation

## 2021-07-03 DIAGNOSIS — R1032 Left lower quadrant pain: Secondary | ICD-10-CM | POA: Diagnosis present

## 2021-07-03 DIAGNOSIS — K760 Fatty (change of) liver, not elsewhere classified: Secondary | ICD-10-CM | POA: Insufficient documentation

## 2021-07-03 DIAGNOSIS — K5792 Diverticulitis of intestine, part unspecified, without perforation or abscess without bleeding: Secondary | ICD-10-CM

## 2021-07-03 DIAGNOSIS — Z91040 Latex allergy status: Secondary | ICD-10-CM | POA: Insufficient documentation

## 2021-07-03 DIAGNOSIS — I700 Atherosclerosis of aorta: Secondary | ICD-10-CM | POA: Diagnosis not present

## 2021-07-03 DIAGNOSIS — K5732 Diverticulitis of large intestine without perforation or abscess without bleeding: Secondary | ICD-10-CM | POA: Insufficient documentation

## 2021-07-03 DIAGNOSIS — I10 Essential (primary) hypertension: Secondary | ICD-10-CM | POA: Insufficient documentation

## 2021-07-03 DIAGNOSIS — J45909 Unspecified asthma, uncomplicated: Secondary | ICD-10-CM | POA: Diagnosis not present

## 2021-07-03 DIAGNOSIS — Z20822 Contact with and (suspected) exposure to covid-19: Secondary | ICD-10-CM | POA: Diagnosis not present

## 2021-07-03 DIAGNOSIS — Z7951 Long term (current) use of inhaled steroids: Secondary | ICD-10-CM | POA: Insufficient documentation

## 2021-07-03 LAB — RESP PANEL BY RT-PCR (FLU A&B, COVID) ARPGX2
Influenza A by PCR: NEGATIVE
Influenza B by PCR: NEGATIVE
SARS Coronavirus 2 by RT PCR: NEGATIVE

## 2021-07-03 LAB — CBC
HCT: 39.7 % (ref 36.0–46.0)
Hemoglobin: 12.8 g/dL (ref 12.0–15.0)
MCH: 28 pg (ref 26.0–34.0)
MCHC: 32.2 g/dL (ref 30.0–36.0)
MCV: 86.9 fL (ref 80.0–100.0)
Platelets: 292 10*3/uL (ref 150–400)
RBC: 4.57 MIL/uL (ref 3.87–5.11)
RDW: 14.2 % (ref 11.5–15.5)
WBC: 12.3 10*3/uL — ABNORMAL HIGH (ref 4.0–10.5)
nRBC: 0 % (ref 0.0–0.2)

## 2021-07-03 LAB — BASIC METABOLIC PANEL
Anion gap: 8 (ref 5–15)
BUN: 14 mg/dL (ref 8–23)
CO2: 23 mmol/L (ref 22–32)
Calcium: 10.9 mg/dL — ABNORMAL HIGH (ref 8.9–10.3)
Chloride: 104 mmol/L (ref 98–111)
Creatinine, Ser: 0.99 mg/dL (ref 0.44–1.00)
GFR, Estimated: >60 mL/min (ref >60)
Glucose, Bld: 133 mg/dL — ABNORMAL HIGH (ref 70–99)
Potassium: 3.4 mmol/L — ABNORMAL LOW (ref 3.5–5.1)
Sodium: 135 mmol/L (ref 135–145)

## 2021-07-03 LAB — HEPATIC FUNCTION PANEL
ALT: 20 U/L (ref 0–44)
AST: 29 U/L (ref 15–41)
Albumin: 4 g/dL (ref 3.5–5.0)
Alkaline Phosphatase: 114 U/L (ref 38–126)
Bilirubin, Direct: 0.1 mg/dL (ref 0.0–0.2)
Indirect Bilirubin: 0.6 mg/dL (ref 0.3–0.9)
Total Bilirubin: 0.7 mg/dL (ref 0.3–1.2)
Total Protein: 8 g/dL (ref 6.5–8.1)

## 2021-07-03 LAB — LIPASE, BLOOD: Lipase: 44 U/L (ref 11–51)

## 2021-07-03 MED ORDER — ONDANSETRON HCL 4 MG/2ML IJ SOLN
4.0000 mg | Freq: Once | INTRAMUSCULAR | Status: AC
  Administered 2021-07-03: 4 mg via INTRAVENOUS
  Filled 2021-07-03: qty 2

## 2021-07-03 MED ORDER — ONDANSETRON HCL 4 MG PO TABS: 4.0000 mg | ORAL_TABLET | Freq: Four times a day (QID) | ORAL | 0 refills | Status: DC

## 2021-07-03 MED ORDER — SODIUM CHLORIDE 0.9 % IV BOLUS
1000.0000 mL | Freq: Once | INTRAVENOUS | Status: AC
  Administered 2021-07-03: 1000 mL via INTRAVENOUS

## 2021-07-03 MED ORDER — MORPHINE SULFATE (PF) 4 MG/ML IV SOLN
4.0000 mg | Freq: Once | INTRAVENOUS | Status: AC
  Administered 2021-07-03: 4 mg via INTRAVENOUS
  Filled 2021-07-03: qty 1

## 2021-07-03 MED ORDER — PIPERACILLIN-TAZOBACTAM 3.375 G IVPB 30 MIN
3.3750 g | Freq: Once | INTRAVENOUS | Status: AC
  Administered 2021-07-03: 3.375 g via INTRAVENOUS
  Filled 2021-07-03: qty 50

## 2021-07-03 MED ORDER — AMOXICILLIN-POT CLAVULANATE 875-125 MG PO TABS: 1.0000 | ORAL_TABLET | Freq: Two times a day (BID) | ORAL | 0 refills | Status: AC

## 2021-07-03 NOTE — ED Triage Notes (Signed)
Abdominal pain with dizziness for over a week

## 2021-07-03 NOTE — Discharge Instructions (Addendum)
You were given a prescription for antibiotics. Please take the antibiotic prescription fully.   You were given a prescription for zofran to help with your nausea. Please take as directed.  Take your regularly scheduled pain meds for your pain   You had some incidental findings on your CT scan today including aortic atherosclerosis, coronary artery disease and hepatic steatosis.  You will need to follow-up with your regular doctor about these diagnoses.  Please follow up with your primary doctor within the next 5-7 days.  If you do not have a primary care provider, information for a healthcare clinic has been provided for you to make arrangements for follow up care. Please return to the ER sooner if you have any new or worsening symptoms, or if you have any of the following symptoms:  Abdominal pain that does not go away.  You have a fever.  You keep throwing up (vomiting).  The pain is felt only in portions of the abdomen. Pain in the right side could possibly be appendicitis. In an adult, pain in the left lower portion of the abdomen could be colitis or diverticulitis.  You pass bloody or black tarry stools.  There is bright red blood in the stool.  The constipation stays for more than 4 days.  There is belly (abdominal) or rectal pain.  You do not seem to be getting better.  You have any questions or concerns.

## 2021-07-03 NOTE — ED Provider Notes (Signed)
[facility] EMERGENCY DEPARTMENT Provider Note   CSN: [id number] Arrival date & time: 07/03/21  1421     History Chief Complaint  Patient presents with   Abdominal Pain    [name] is a 65 y.o. female.  HPI   Pt is a 65 y/o female with a h/o anxiety, asthma, headache, HTN, seasonal allergies, vertigo, diverticulitis, who presents to the ED today for eval of lower abd pain. Reports llq abd pain that has been constant for the last 4 days. Reports fevers, nausea, vomiting, diarrhea. States sxs feel consistent with her prior hx of diverticulitis.  Denies bloody stool, constipation, urinary sxs.   Past Medical History:  Diagnosis Date   Anxiety    Asthma    Chronic knee pain    Diverticulitis    Headache    Hypertension    Seasonal allergies    Vertigo     Patient Active Problem List   Diagnosis Date Noted   s/p right knee arthroscopy lateral meniscectomy 04/29/21 05/06/2021   Old complex tear of lateral meniscus of right knee    Diverticulitis of colon 09/15/2017   Diverticulitis 05/24/2017   AKI (acute kidney injury) ([place]) 05/24/2017   Hypokalemia 05/24/2017   Essential hypertension 05/24/2017   Vertigo 05/24/2017   CAP (community acquired pneumonia) 05/04/2015   Sepsis ([place]) 05/02/2015   Acute respiratory failure ([place]) 05/02/2015    Past Surgical History:  Procedure Laterality Date   ABDOMINAL HYSTERECTOMY     ABLATION COLPOCLESIS     CHOLECYSTECTOMY     COLONOSCOPY WITH PROPOFOL N/A 10/04/2017   Procedure: COLONOSCOPY WITH PROPOFOL;  Surgeon: Rogene Houston, MD;  Location: [facility] ENDO SUITE;  Service: Endoscopy;  Laterality: N/A;  1:15   KNEE ARTHROSCOPY WITH LATERAL MENISECTOMY Right 04/29/2021   Procedure: RIGHT KNEE ARTHROSCOPY WITH LATERAL MENISCECTOMY;  Surgeon: Carole Civil, MD;  Location: [facility] ORS;  Service: Orthopedics;  Laterality: Right;   POLYPECTOMY  10/04/2017   Procedure: POLYPECTOMY;  Surgeon: Rogene Houston, MD;  Location: [facility] ENDO  SUITE;  Service: Endoscopy;;  sigmoid colon polyp     OB History     Gravida  3   Para  2   Term  2   Preterm      AB  1   Living         SAB  1   IAB      Ectopic      Multiple      Live Births              Family History  Problem Relation Age of Onset   Emphysema Mother    Stroke Father    Hypertension Father    Diabetes Sister    Hypertension Sister    Stroke Brother    Hypertension Brother     Social History   Tobacco Use   Smoking status: Never   Smokeless tobacco: Never  Vaping Use   Vaping Use: Never used  Substance Use Topics   Alcohol use: No   Drug use: No    Home Medications Prior to Admission medications   Medication Sig Start Date End Date Taking? Authorizing Provider  amoxicillin-clavulanate (AUGMENTIN) 875-125 MG tablet Take 1 tablet by mouth 2 (two) times daily for 7 days. 07/03/21 07/10/21 Yes TRUE Garciamartinez S, PA-C  ondansetron (ZOFRAN) 4 MG tablet Take 1 tablet (4 mg total) by mouth every 6 (six) hours. 07/03/21  Yes Richey Doolittle S, PA-C  albuterol (PROVENTIL  HFA;VENTOLIN HFA) 108 (90 Base) MCG/ACT inhaler Inhale 2 puffs into the lungs every 6 (six) hours as needed for wheezing or shortness of breath.     [provider]  ALPRAZolam Duanne Moron) 1 MG tablet Take 2 mg by mouth at bedtime.    [provider]  fluorometholone (FML) 0.1 % ophthalmic suspension Place 1 drop into both eyes in the morning and at bedtime. 08/07/19   [provider]  fluticasone (FLONASE) 50 MCG/ACT nasal spray Place 1-2 sprays into both nostrils daily as needed for allergies or rhinitis.     [provider]  fluticasone (FLOVENT HFA) 110 MCG/ACT inhaler Inhale 2 puffs into the lungs as directed.    [provider]  ibuprofen (ADVIL) 800 MG tablet Take 1 tablet (800 mg total) by mouth every 8 (eight) hours as needed. 04/29/21   Carole Civil, MD  meloxicam (MOBIC) 7.5 MG tablet Take 1 tablet (7.5 mg total) by  mouth in the morning and at bedtime. 05/26/21   Carole Civil, MD  Multiple Vitamin (MULTIVITAMIN WITH MINERALS) TABS tablet Take 1 tablet by mouth daily.    [provider]  Oxycodone HCl 10 MG TABS Take 10 mg by mouth every 8 (eight) hours as needed (severe pain).    [provider]  polyvinyl alcohol (LIQUIFILM TEARS) 1.4 % ophthalmic solution Place 1 drop into both eyes every 4 (four) hours as needed for dry eyes.    [provider]  triamcinolone cream (KENALOG) 0.1 % Apply 1 application topically 2 (two) times daily. Apply sparingly to neck rash for up to 10 days. Patient taking differently: Apply 1 application topically 2 (two) times daily as needed (for rash). 09/23/17   Evalee Jefferson, PA-C  triamterene-hydrochlorothiazide (MAXZIDE) 75-50 MG tablet Take 1 tablet by mouth daily.    [provider]  Wheat Dextrin (BENEFIBER) POWD Take 1 Scoop by mouth daily as needed (constipation).    [provider]    Allergies    Iohexol, Latex, Benadryl [diphenhydramine], and Sulfa antibiotics  Review of Systems   Review of Systems  Constitutional:  Positive for fever.  HENT:  Negative for ear pain and sore throat.   Eyes:  Negative for pain and visual disturbance.  Respiratory:  Negative for cough and shortness of breath.   Cardiovascular:  Negative for chest pain.  Gastrointestinal:  Positive for abdominal pain, diarrhea, nausea and vomiting.  Genitourinary:  Negative for dysuria and hematuria.  Musculoskeletal:  Negative for back pain.  Skin:  Negative for rash.  Neurological:  Negative for headaches.  All other systems reviewed and are negative.  Physical Exam Updated Vital Signs BP (!) 152/69   Pulse 88   Temp 99.6 F (37.6 C) (Oral)   Resp 17   SpO2 96%   Physical Exam Vitals and nursing note reviewed.  Constitutional:      General: She is not in acute distress.    Appearance: She is well-developed.  HENT:     Head: Normocephalic  and atraumatic.  Eyes:     Conjunctiva/sclera: Conjunctivae normal.  Cardiovascular:     Rate and Rhythm: Normal rate and regular rhythm.     Heart sounds: No murmur heard. Pulmonary:     Effort: Pulmonary effort is normal. No respiratory distress.     Breath sounds: Normal breath sounds.  Abdominal:     Palpations: Abdomen is soft.     Tenderness: There is abdominal tenderness in the left upper quadrant and left  lower quadrant. There is left CVA tenderness and guarding. There is no rebound.  Musculoskeletal:     Cervical back: Neck supple.  Skin:    General: Skin is warm and dry.  Neurological:     Mental Status: She is alert.    ED Results / Procedures / Treatments   Labs (all labs ordered are listed, but only abnormal results are displayed) Labs Reviewed  BASIC METABOLIC PANEL - Abnormal; Notable for the following components:      Result Value   Potassium 3.4 (*)    Glucose, Bld 133 (*)    Calcium 10.9 (*)    All other components within normal limits  CBC - Abnormal; Notable for the following components:   WBC 12.3 (*)    All other components within normal limits  RESP PANEL BY RT-PCR (FLU A&B, COVID) ARPGX2  LIPASE, BLOOD  HEPATIC FUNCTION PANEL  CBG MONITORING, ED    EKG EKG Interpretation  Date/Time:  Thursday July 03 2021 15:09:37 EDT Ventricular Rate:  103 PR Interval:  134 QRS Duration: 120 QT Interval:  348 QTC Calculation: 455 R Axis:   -72 Text Interpretation: Sinus tachycardia with Premature atrial complexes Pulmonary disease pattern Right bundle branch block Left anterior fascicular block  Bifascicular block  Minimal voltage criteria for LVH, may be normal variant ( R in aVL ) Abnormal ECG Confirmed by Thamas Jaegers ([id number]) on 07/03/2021 4:05:20 PM  Radiology CT ABDOMEN PELVIS WO CONTRAST  Result Date: 07/03/2021 CLINICAL DATA:  Abdominal pain with dizziness. Diverticulitis suspected. EXAM: CT ABDOMEN AND PELVIS WITHOUT CONTRAST TECHNIQUE:  Multidetector CT imaging of the abdomen and pelvis was performed following the standard protocol without IV contrast. COMPARISON:  08/15/2020 FINDINGS: Lower chest: Bibasilar scarring. Mild cardiomegaly with LAD coronary artery calcification. Hepatobiliary: Moderate hepatic steatosis. Cholecystectomy, without biliary ductal dilatation. Pancreas: Normal, without mass or ductal dilatation. Spleen: Normal in size, without focal abnormality. Adrenals/Urinary Tract: Normal adrenal glands. Mild renal cortical thinning bilaterally. No renal calculi or hydronephrosis. No hydroureter or ureteric calculi. No bladder calculi. Stomach/Bowel: Normal stomach, without wall thickening. Extensive colonic diverticulosis. Wall thickening of and edema surrounding the proximal descending colon including on 41/2. No drainable abscess or free perforation. Normal terminal ileum and appendix. Normal small bowel. Vascular/Lymphatic: Aortic atherosclerosis. Left periaortic node measures 8 mm today versus 11 mm on the prior, likely reactive. No pelvic sidewall adenopathy. Reproductive: Normal uterus and adnexa. Other: No significant free fluid. Musculoskeletal: No acute osseous abnormality. IMPRESSION: 1. Non complicated proximal descending colonic diverticulitis. 2. Hepatic steatosis. 3. Coronary artery atherosclerosis. 4. Aortic Atherosclerosis (ICD10-I70.0). Electronically Signed   By: Abigail Miyamoto M.D.   On: 07/03/2021 19:34    Procedures Procedures   Medications Ordered in ED Medications  ondansetron (ZOFRAN) injection 4 mg (4 mg Intravenous Given 07/03/21 1646)  sodium chloride 0.9 % bolus 1,000 mL (0 mLs Intravenous Stopped 07/03/21 1913)  morphine 4 MG/ML injection 4 mg (4 mg Intravenous Given 07/03/21 1646)  piperacillin-tazobactam (ZOSYN) IVPB 3.375 g (0 g Intravenous Stopped 07/03/21 2017)    ED Course  I have reviewed the triage vital signs and the nursing notes.  Pertinent labs & imaging results that were available  during my care of the patient were reviewed by me and considered in my medical decision making (see chart for details).    MDM Rules/Calculators/A&P                          65 y/o  F presenting for eval of abd pain to the llq  Reviewed/interpreted labs CBC with mild leukocytosis, no anemia BMP with mild hypokalemia, otherwise reassuring Hepatic function panel wnl Lipase wnl COVID neg  EKG - Sinus tachycardia with Premature atrial complexes Pulmonary disease pattern Right bundle branch block Left anterior fascicular block, Bifascicular block, Minimal voltage criteria for LVH, may be normal variant ( R in aVL ) Abnormal ECG   Reviewed/interpreted imaging CT abd/ pelvis - 1. Non complicated proximal descending colonic diverticulitis. 2. Hepatic steatosis. 3. Coronary artery atherosclerosis. 4. Aortic Atherosclerosis  Patient given Zosyn here in the ED.  She is able to tolerate p.o. and reports pain improved.  Feel she is appropriate for discharge home with outpatient treatment and close follow-up with PCP.  She is advised on specific return precautions.  She voices understanding plan and reasons to return.  All questions answered.  Patient stable for discharge.   Final Clinical Impression(s) / ED Diagnoses Final diagnoses:  Diverticulitis  Aortic atherosclerosis ([place])  Coronary artery disease involving native heart, unspecified vessel or lesion type, unspecified whether angina present  Hepatic steatosis    Rx / DC Orders ED Discharge Orders          Ordered    amoxicillin-clavulanate (AUGMENTIN) 875-125 MG tablet  2 times daily        07/03/21 2018    ondansetron (ZOFRAN) 4 MG tablet  Every 6 hours        07/03/21 2018             Bishop Dublin 07/03/21 2019    [name], MD 07/11/21 1126

## 2021-07-03 NOTE — ED Provider Notes (Signed)
Emergency Medicine Provider Triage Evaluation Note  [name] , a 65 y.o. female  was evaluated in triage.  Pt complains of abdominal pain, nausea, vomiting, diarrhea for the past 4 days.  Review of Systems  Positive: Abdominal pain, nausea, vomiting, diarrhea, chills Negative: Hematochezia/melena, chest pain, shortness of breath  Physical Exam  BP (!) 144/87 (BP Location: Right Wrist)   Pulse (!) 101   Temp 99.6 F (37.6 C) (Oral)   Resp 20   SpO2 97%  Gen:   Awake, no distress   Resp:  Normal effort  MSK:   Moves extremities without difficulty  Other:  Tender along the left abdomen  Medical Decision Making  Medically screening exam initiated at 3:38 PM.  Appropriate orders placed.  [name] was informed that the remainder of the evaluation will be completed by another provider, this initial triage assessment does not replace that evaluation, and the importance of remaining in the ED until their evaluation is complete.     [name] 07/03/21 1639    Kathleen Fuse, MD 07/11/21 1125

## 2021-07-03 NOTE — ED Notes (Signed)
Pt given water at this time, will reassess.

## 2021-07-14 ENCOUNTER — Other Ambulatory Visit (HOSPITAL_COMMUNITY): Payer: Self-pay | Admitting: Family Medicine

## 2021-07-14 DIAGNOSIS — Z1231 Encounter for screening mammogram for malignant neoplasm of breast: Secondary | ICD-10-CM

## 2021-07-15 ENCOUNTER — Encounter (HOSPITAL_COMMUNITY): Payer: Self-pay | Admitting: Emergency Medicine

## 2021-07-15 ENCOUNTER — Emergency Department (HOSPITAL_COMMUNITY)
Admission: EM | Admit: 2021-07-15 | Discharge: 2021-07-15 | Disposition: A | Discharge status: Home / Self Care | Payer: Medicare Other | Attending: Emergency Medicine | Admitting: Emergency Medicine

## 2021-07-15 ENCOUNTER — Other Ambulatory Visit: Payer: Self-pay

## 2021-07-15 ENCOUNTER — Ambulatory Visit (HOSPITAL_COMMUNITY): Payer: Medicare Other | Admitting: Physical Therapy

## 2021-07-15 DIAGNOSIS — Z5321 Procedure and treatment not carried out due to patient leaving prior to being seen by health care provider: Secondary | ICD-10-CM | POA: Diagnosis not present

## 2021-07-15 DIAGNOSIS — R42 Dizziness and giddiness: Secondary | ICD-10-CM | POA: Diagnosis not present

## 2021-07-15 DIAGNOSIS — R109 Unspecified abdominal pain: Secondary | ICD-10-CM | POA: Diagnosis present

## 2021-07-15 LAB — COMPREHENSIVE METABOLIC PANEL
ALT: 18 U/L (ref 0–44)
AST: 25 U/L (ref 15–41)
Albumin: 3.9 g/dL (ref 3.5–5.0)
Alkaline Phosphatase: 115 U/L (ref 38–126)
Anion gap: 9 (ref 5–15)
BUN: 17 mg/dL (ref 8–23)
CO2: 26 mmol/L (ref 22–32)
Calcium: 11.4 mg/dL — ABNORMAL HIGH (ref 8.9–10.3)
Chloride: 101 mmol/L (ref 98–111)
Creatinine, Ser: 1.12 mg/dL — ABNORMAL HIGH (ref 0.44–1.00)
GFR, Estimated: 54 mL/min — ABNORMAL LOW (ref >60)
Glucose, Bld: 129 mg/dL — ABNORMAL HIGH (ref 70–99)
Potassium: 3.5 mmol/L (ref 3.5–5.1)
Sodium: 136 mmol/L (ref 135–145)
Total Bilirubin: 0.5 mg/dL (ref 0.3–1.2)
Total Protein: 8.1 g/dL (ref 6.5–8.1)

## 2021-07-15 LAB — CBC
HCT: 40.7 % (ref 36.0–46.0)
Hemoglobin: 12.9 g/dL (ref 12.0–15.0)
MCH: 27.3 pg (ref 26.0–34.0)
MCHC: 31.7 g/dL (ref 30.0–36.0)
MCV: 86 fL (ref 80.0–100.0)
Platelets: 355 10*3/uL (ref 150–400)
RBC: 4.73 MIL/uL (ref 3.87–5.11)
RDW: 13.7 % (ref 11.5–15.5)
WBC: 9.7 10*3/uL (ref 4.0–10.5)
nRBC: 0 % (ref 0.0–0.2)

## 2021-07-15 LAB — LIPASE, BLOOD: Lipase: 50 U/L (ref 11–51)

## 2021-07-15 NOTE — ED Triage Notes (Signed)
Pt c/o abd pain and dizziness since the beginning of this month. Pt seen for same on 7/14 and dx with diverticulitis, pt states she doesn't think that is the problem.

## 2021-07-16 ENCOUNTER — Other Ambulatory Visit: Payer: Medicare Other | Admitting: Obstetrics & Gynecology

## 2021-07-23 ENCOUNTER — Encounter (HOSPITAL_COMMUNITY): Payer: Self-pay

## 2021-07-23 ENCOUNTER — Ambulatory Visit (HOSPITAL_COMMUNITY): Payer: Medicare Other | Attending: Orthopedic Surgery

## 2021-07-23 ENCOUNTER — Other Ambulatory Visit: Payer: Self-pay

## 2021-07-23 DIAGNOSIS — M6281 Muscle weakness (generalized): Secondary | ICD-10-CM | POA: Insufficient documentation

## 2021-07-23 DIAGNOSIS — R262 Difficulty in walking, not elsewhere classified: Secondary | ICD-10-CM | POA: Diagnosis present

## 2021-07-23 DIAGNOSIS — M7662 Achilles tendinitis, left leg: Secondary | ICD-10-CM | POA: Insufficient documentation

## 2021-07-23 DIAGNOSIS — R29898 Other symptoms and signs involving the musculoskeletal system: Secondary | ICD-10-CM | POA: Insufficient documentation

## 2021-07-23 NOTE — Therapy (Signed)
[place], [place], [address] Phone: [phone]   Fax:  [phone]  Physical Therapy Evaluation  Patient Details  Name: [name] MRN: [id number] Date of Birth: 12/09/1956 Referring Provider (PT): Carole Civil, MD   Encounter Date: 07/23/2021   PT End of Session - 07/23/21 0929     Visit Number 1    Number of Visits 12    Date for PT Re-Evaluation 09/03/21    Authorization Type UHC Medicare    Progress Note Due on Visit 10    PT Start Time 0900    PT Stop Time 0945    PT Time Calculation (min) 45 min    Activity Tolerance Patient limited by pain    Behavior During Therapy [facility] for tasks assessed/performed             Past Medical History:  Diagnosis Date   Anxiety    Asthma    Chronic knee pain    Diverticulitis    Headache    Hypertension    Seasonal allergies    Vertigo     Past Surgical History:  Procedure Laterality Date   ABDOMINAL HYSTERECTOMY     ABLATION COLPOCLESIS     CHOLECYSTECTOMY     COLONOSCOPY WITH PROPOFOL N/A 10/04/2017   Procedure: COLONOSCOPY WITH PROPOFOL;  Surgeon: Rogene Houston, MD;  [facility];  Service: Endoscopy;  Laterality: N/A;  1:15   KNEE ARTHROSCOPY WITH LATERAL MENISECTOMY Right 04/29/2021   Procedure: RIGHT KNEE ARTHROSCOPY WITH LATERAL MENISCECTOMY;  Surgeon: Carole Civil, MD;  Location: [facility];  Service: Orthopedics;  Laterality: Right;   POLYPECTOMY  10/04/2017   Procedure: POLYPECTOMY;  Surgeon: Rogene Houston, MD;  [facility];  Service: Endoscopy;;  sigmoid colon polyp    There were no vitals filed for this visit.    Subjective Assessment - 07/23/21 0904     Subjective Pt with left Achilles tendon pain x 2 years and had right knee lateral menisectomy on 04/29/21    How long can you stand comfortably? "not very long"    How long can you walk comfortably? "not very long"    Currently in Pain? Yes     Pain Score 8     Pain Location Heel    Pain Orientation Left    Pain Descriptors / Indicators Aching;Burning    Pain Type Chronic pain    Aggravating Factors  walking, standing    Pain Relieving Factors rest                [facility] PT Assessment - 07/23/21 0001       Assessment   Medical Diagnosis Tendonitis, Achilles, left    Referring Provider (PT) Carole Civil, MD    Next MD Visit 08/11/21      Balance Screen   Has the patient fallen in the past 6 months No    Has the patient had a decrease in activity level because of a fear of falling?  No    Is the patient reluctant to leave their home because of a fear of falling?  No      Home Environment   Living Environment Private residence    Type of Enterprise to enter    Entrance Stairs-Number of Steps 7    Entrance Stairs-Rails Can reach both    [place] One level  [place] - 2 wheels   does not use     Prior Function   Level of Independence Independent    Vocation Unemployed    Vocation Requirements was working for [employer] and [employer] "any and everything"      Observation/Other Assessments   Focus on Therapeutic Outcomes (FOTO)  33.3% function      ROM / Strength   AROM / PROM / Strength AROM;Strength      AROM   AROM Assessment Site Ankle    Right/Left Ankle Left    Left Ankle Dorsiflexion 10    Left Ankle Plantar Flexion 35      Strength   Strength Assessment Site Ankle    Right/Left Ankle Left    Left Ankle Dorsiflexion 5/5    Left Ankle Plantar Flexion 2/5      Transfers   Five time sit to stand comments  26      Ambulation/Gait   Ambulation/Gait Yes    Ambulation/Gait Assistance 7: Independent    Ambulation Distance (Feet) 150 Feet    Assistive device None    Gait Pattern Antalgic    Ambulation Surface Level;Indoor    Stairs Yes    Stairs Assistance 6: Modified independent (Device/Increase time)    Stair Management Technique Two  rails;Step to pattern    Gait Comments 2MWT                        Objective measurements completed on examination: See above findings.               PT Education - 07/23/21 [phone]     Education Details education on tendinopathy and HEP initiation    Person(s) Educated Patient    Methods Explanation;Demonstration;Handout    Comprehension Verbalized understanding;Need further instruction              PT Short Term Goals - 07/23/21 0935       PT SHORT TERM GOAL #1   Title Patient will be independent with HEP in order to improve functional outcomes.    Time 3    Period Weeks    Status New    Target Date 08/13/21      PT SHORT TERM GOAL #2   Title Patient will report at least 25% improvement in symptoms for improved quality of life.    Baseline 8/10 left achilles pain with standing/walking    Time 3    Period Weeks    Status New    Target Date 08/13/21      PT SHORT TERM GOAL #3   Title Improved strength as evidenced by 15 sec 5xSTS test    Baseline 26 sec    Time 3    Period Weeks    Status New    Target Date 08/13/21               PT Long Term Goals - 07/23/21 0937       PT LONG TERM GOAL #1   Title Patient will improve FOTO score by at least 10 points in order to indicate improved tolerance to activity.    Baseline 33.3% function    Time 6    Period Weeks    Status New    Target Date 09/03/21      PT LONG TERM GOAL #2   Title Improved gait velocity as evidenced by distance of 200 ft during 2MWT    Baseline  150 ft with antalgia    Time 6    Period Weeks    Status New    Target Date 09/03/21      PT LONG TERM GOAL #3   Title Patient will report at least 50% improvement in symptoms for improved quality of life.    Baseline 8/10 pain left ankle    Time 6    Period Weeks    Status New    Target Date 09/03/21                    Plan - 07/23/21 0930     Clinical Impression Statement Patient is a 65 yo lady  presenting to physical therapy with c/o left achilles and right knee pain. She presents with pain limited deficits in left ankle strength, ROM, endurance, postural impairments, and functional mobility with ADL. She is having to modify and restrict ADL as indicated by FOTO score as well as subjective information and objective measures which is affecting overall participation. Patient will benefit from skilled physical therapy in order to improve function and reduce impairment.    Personal Factors and Comorbidities Age;Time since onset of injury/illness/exacerbation;Past/Current Experience    Examination-Activity Limitations Carry;Squat;Stairs;Stand;Lift;Locomotion Level;Transfers    Examination-Participation Restrictions Cleaning;Community Activity;Yard Work;Occupation    Stability/Clinical Decision Making Stable/Uncomplicated    Clinical Decision Making Low    Rehab Potential Good    PT Frequency 2x / week    PT Duration 6 weeks    PT Treatment/Interventions ADLs/Self Care Home Management;Aquatic Therapy;Electrical Stimulation;Ultrasound;Moist Heat;Gait training;Stair training;Functional mobility training;Therapeutic activities;Therapeutic exercise;Balance training;Orthotic Fit/Training;Neuromuscular re-education;Manual techniques;Scar mobilization;Passive range of motion;Taping;Splinting;Dry needling;Joint Manipulations    PT Next Visit Plan Continue with left achilles tendinopathy tx, add right knee ROM/strength exercises    PT Home Exercise Plan calf stretch with towel, wall stretch for calf    Consulted and Agree with Plan of Care Patient             Patient will benefit from skilled therapeutic intervention in order to improve the following deficits and impairments:  Abnormal gait, Decreased activity tolerance, Decreased balance, Decreased mobility, Difficulty walking, Improper body mechanics, Pain, Obesity, Decreased endurance  Visit Diagnosis: Difficulty in walking, not elsewhere  classified  Muscle weakness (generalized)  Other symptoms and signs involving the musculoskeletal system  Tendonitis, Achilles, left     Problem List Patient Active Problem List   Diagnosis Date Noted   s/p right knee arthroscopy lateral meniscectomy 04/29/21 05/06/2021   Old complex tear of lateral meniscus of right knee    Diverticulitis of colon 09/15/2017   Diverticulitis 05/24/2017   AKI (acute kidney injury) ([place]) 05/24/2017   Hypokalemia 05/24/2017   Essential hypertension 05/24/2017   Vertigo 05/24/2017   CAP (community acquired pneumonia) 05/04/2015   Sepsis ([place]) 05/02/2015   Acute respiratory failure ([place]) 05/02/2015   9:43 AM, 07/23/21 M. Sherlyn Lees, PT, DPT Physical Therapist- [place] Office Number: [phone]   [place] [address] [place], [place], [address] Phone: [phone]   Fax:  [phone]  Name: [name] MRN: [id number] Date of Birth: October 03, 1956

## 2021-07-25 ENCOUNTER — Encounter (HOSPITAL_COMMUNITY): Payer: Self-pay

## 2021-07-25 ENCOUNTER — Other Ambulatory Visit: Payer: Self-pay

## 2021-07-25 ENCOUNTER — Ambulatory Visit (HOSPITAL_COMMUNITY): Payer: Medicare Other

## 2021-07-25 DIAGNOSIS — M7662 Achilles tendinitis, left leg: Secondary | ICD-10-CM

## 2021-07-25 DIAGNOSIS — R262 Difficulty in walking, not elsewhere classified: Secondary | ICD-10-CM | POA: Diagnosis not present

## 2021-07-25 DIAGNOSIS — M6281 Muscle weakness (generalized): Secondary | ICD-10-CM

## 2021-07-25 DIAGNOSIS — R29898 Other symptoms and signs involving the musculoskeletal system: Secondary | ICD-10-CM

## 2021-07-25 NOTE — Therapy (Signed)
[place], [place], [address] Phone: [phone]   Fax:  [phone]  Physical Therapy Treatment  Patient Details  Name: [name] MRN: [id number] Date of Birth: 07-14-1956 Referring Provider (PT): Carole Civil, MD   Encounter Date: 07/25/2021   PT End of Session - 07/25/21 0836     Visit Number 2    Number of Visits 12    Date for PT Re-Evaluation 09/03/21    Authorization Type UHC Medicare    Progress Note Due on Visit 10    PT Start Time 0828    PT Stop Time 0918    PT Time Calculation (min) 50 min    Activity Tolerance Patient tolerated treatment well;Patient limited by pain;No increased pain    Behavior During Therapy WFL for tasks assessed/performed             Past Medical History:  Diagnosis Date   Anxiety    Asthma    Chronic knee pain    Diverticulitis    Headache    Hypertension    Seasonal allergies    Vertigo     Past Surgical History:  Procedure Laterality Date   ABDOMINAL HYSTERECTOMY     ABLATION COLPOCLESIS     CHOLECYSTECTOMY     COLONOSCOPY WITH PROPOFOL N/A 10/04/2017   Procedure: COLONOSCOPY WITH PROPOFOL;  Surgeon: Rogene Houston, MD;  [facility];  Service: Endoscopy;  Laterality: N/A;  1:15   KNEE ARTHROSCOPY WITH LATERAL MENISECTOMY Right 04/29/2021   Procedure: RIGHT KNEE ARTHROSCOPY WITH LATERAL MENISCECTOMY;  Surgeon: Carole Civil, MD;  Location: [facility];  Service: Orthopedics;  Laterality: Right;   POLYPECTOMY  10/04/2017   Procedure: POLYPECTOMY;  Surgeon: Rogene Houston, MD;  [facility];  Service: Endoscopy;;  sigmoid colon polyp    There were no vitals filed for this visit.   Subjective Assessment - 07/25/21 0832     Subjective Pt stated she is feeling better, has began the exercsises at home.  Reports increased pain descending stairs.  Pain scale Lt foot 7/10.    Currently in Pain? Yes    Pain Score 7     Pain  Location Heel    Pain Orientation Left    Pain Descriptors / Indicators Tender;Tightness    Pain Type Chronic pain    Pain Onset More than a month ago    Pain Frequency Constant    Aggravating Factors  walking, standing    Pain Relieving Factors rest    Effect of Pain on Daily Activities limits                               [facility] Adult PT Treatment/Exercise - 07/25/21 0001       Exercises   Exercises Ankle      Ankle Exercises: Stretches   Gastroc Stretch 3 reps;30 seconds    Gastroc Stretch Limitations seated with towel stretch; standing against wall      Ankle Exercises: Aerobic   Stationary Bike Full revolution seat 3 x 4 min L2      Ankle Exercises: Standing   Heel Raises 10 reps      Ankle Exercises: Seated   Ankle Circles/Pumps 10 reps    Heel Raises 10 reps    Toe Raise 10 reps      Ankle Exercises: Supine   T-Band all directions 10x  Other Supine Ankle Exercises quad sets 10x, heel slides 10x, SAQ 10x                    PT Education - 07/25/21 0840     Education Details Reviewed goals, educated importance of HEP compliance for maximal benefits, pt able to recall current stretches.    Person(s) Educated Patient    Methods Explanation;Demonstration    Comprehension Verbalized understanding;Returned demonstration              PT Short Term Goals - 07/23/21 0935       PT SHORT TERM GOAL #1   Title Patient will be independent with HEP in order to improve functional outcomes.    Time 3    Period Weeks    Status New    Target Date 08/13/21      PT SHORT TERM GOAL #2   Title Patient will report at least 25% improvement in symptoms for improved quality of life.    Baseline 8/10 left achilles pain with standing/walking    Time 3    Period Weeks    Status New    Target Date 08/13/21      PT SHORT TERM GOAL #3   Title Improved strength as evidenced by 15 sec 5xSTS test    Baseline 26 sec    Time 3    Period Weeks     Status New    Target Date 08/13/21               PT Long Term Goals - 07/23/21 0937       PT LONG TERM GOAL #1   Title Patient will improve FOTO score by at least 10 points in order to indicate improved tolerance to activity.    Baseline 33.3% function    Time 6    Period Weeks    Status New    Target Date 09/03/21      PT LONG TERM GOAL #2   Title Improved gait velocity as evidenced by distance of 200 ft during 2MWT    Baseline 150 ft with antalgia    Time 6    Period Weeks    Status New    Target Date 09/03/21      PT LONG TERM GOAL #3   Title Patient will report at least 50% improvement in symptoms for improved quality of life.    Baseline 8/10 pain left ankle    Time 6    Period Weeks    Status New    Target Date 09/03/21                   Plan - 07/25/21 0850     Clinical Impression Statement Reviewed goals, educated importance of HEP compliance for maximal beneits, pt able to recall current stretches, encouraged to hold for longer duration for maximal musculature.  Session focus on ankle mobility and strengthening.  Added theraband for ankle strengthening all directions.  Rt knee AROM 6-120 degrees,  Pt educated on importance of quad strengthening to improve knee extension wiht gait and stairs.  Added ankle circle, heel raise and quad sets to HEP to address weakness.  EOS pt reports pain reduced to 4/10.    Personal Factors and Comorbidities Age;Time since onset of injury/illness/exacerbation;Past/Current Experience    Examination-Activity Limitations Carry;Squat;Stairs;Stand;Lift;Locomotion Level;Transfers    Stability/Clinical Decision Making Stable/Uncomplicated    Clinical Decision Making Low    Rehab Potential Good    PT Frequency  2x / week    PT Duration 6 weeks    PT Treatment/Interventions ADLs/Self Care Home Management;Aquatic Therapy;Electrical Stimulation;Ultrasound;Moist Heat;Gait training;Stair training;Functional mobility  training;Therapeutic activities;Therapeutic exercise;Balance training;Orthotic Fit/Training;Neuromuscular re-education;Manual techniques;Scar mobilization;Passive range of motion;Taping;Splinting;Dry needling;Joint Manipulations    PT Next Visit Plan Continue with left achilles tendinopathy tx, add right knee ROM/strength exercises    PT Home Exercise Plan calf stretch with towel, wall stretch for calf; 07/25/21: ankle circles, heel raise, quad sets.    Consulted and Agree with Plan of Care Patient             Patient will benefit from skilled therapeutic intervention in order to improve the following deficits and impairments:  Abnormal gait, Decreased activity tolerance, Decreased balance, Decreased mobility, Difficulty walking, Improper body mechanics, Pain, Obesity, Decreased endurance  Visit Diagnosis: Difficulty in walking, not elsewhere classified  Muscle weakness (generalized)  Other symptoms and signs involving the musculoskeletal system  Tendonitis, Achilles, left     Problem List Patient Active Problem List   Diagnosis Date Noted   s/p right knee arthroscopy lateral meniscectomy 04/29/21 05/06/2021   Old complex tear of lateral meniscus of right knee    Diverticulitis of colon 09/15/2017   Diverticulitis 05/24/2017   AKI (acute kidney injury) ([place]) 05/24/2017   Hypokalemia 05/24/2017   Essential hypertension 05/24/2017   Vertigo 05/24/2017   CAP (community acquired pneumonia) 05/04/2015   Sepsis ([place]) 05/02/2015   Acute respiratory failure ([place]) 05/02/2015   Ihor Austin, LPTA/CLT; CBIS [phone]' Aldona Lento 07/25/2021, 9:24 AM  [place] [address] [place], [place], [address] Phone: [phone]   Fax:  [phone]  Name: SHAQUANDRA VIDOVICH MRN: [id number] Date of Birth: 1956-10-20

## 2021-07-25 NOTE — Patient Instructions (Addendum)
Ankle Circles    Slowly rotate right foot and ankle clockwise then counterclockwise. Gradually increase range of motion. Avoid pain. Circle 10 times each direction per set. Do 2 sets per day.  http://orth.exer.us/31   Copyright  VHI. All rights reserved.   Toe / Heel Raise (Standing)    Standing with support, raise heels, then rock back on heels and raise toes. Repeat 10 times.  Copyright  VHI. All rights reserved.   Quad [employer] top of right thigh. Hold for 5 seconds.  Repeat 10 times. Do 2 times a day.    Copyright  VHI. All rights reserved.

## 2021-07-28 ENCOUNTER — Other Ambulatory Visit: Payer: Self-pay

## 2021-07-28 ENCOUNTER — Ambulatory Visit (HOSPITAL_COMMUNITY): Payer: Medicare Other | Admitting: Physical Therapy

## 2021-07-28 ENCOUNTER — Encounter (HOSPITAL_COMMUNITY): Payer: Self-pay | Admitting: Physical Therapy

## 2021-07-28 DIAGNOSIS — R262 Difficulty in walking, not elsewhere classified: Secondary | ICD-10-CM

## 2021-07-28 DIAGNOSIS — R29898 Other symptoms and signs involving the musculoskeletal system: Secondary | ICD-10-CM

## 2021-07-28 DIAGNOSIS — M6281 Muscle weakness (generalized): Secondary | ICD-10-CM

## 2021-07-28 DIAGNOSIS — M7662 Achilles tendinitis, left leg: Secondary | ICD-10-CM

## 2021-07-29 NOTE — Therapy (Signed)
[place], [place], [address] Phone: [phone]   Fax:  [phone]  Physical Therapy Treatment  Patient Details  Name: [name] MRN: [id number] Date of Birth: [birth year] Referring Provider (PT): Carole Civil, MD   Encounter Date: 07/28/2021   PT End of Session - 07/29/21 1000     Visit Number 3    Number of Visits 12    Date for PT Re-Evaluation 09/03/21    Authorization Type [employer] Medicare    Progress Note Due on Visit 10    PT Start Time 1515    PT Stop Time 1555    PT Time Calculation (min) 40 min    Activity Tolerance Patient tolerated treatment well;No increased pain    Behavior During Therapy WFL for tasks assessed/performed             Past Medical History:  Diagnosis Date   Anxiety    Asthma    Chronic knee pain    Diverticulitis    Headache    Hypertension    Seasonal allergies    Vertigo     Past Surgical History:  Procedure Laterality Date   ABDOMINAL HYSTERECTOMY     ABLATION COLPOCLESIS     CHOLECYSTECTOMY     COLONOSCOPY WITH PROPOFOL N/A 10/04/2017   Procedure: COLONOSCOPY WITH PROPOFOL;  Surgeon: Rogene Houston, MD;  [facility];  Service: Endoscopy;  Laterality: N/A;  1:15   KNEE ARTHROSCOPY WITH LATERAL MENISECTOMY Right 04/29/2021   Procedure: RIGHT KNEE ARTHROSCOPY WITH LATERAL MENISCECTOMY;  Surgeon: Carole Civil, MD;  Location: [facility];  Service: Orthopedics;  Laterality: Right;   POLYPECTOMY  10/04/2017   Procedure: POLYPECTOMY;  Surgeon: Rogene Houston, MD;  [facility];  Service: Endoscopy;;  sigmoid colon polyp    There were no vitals filed for this visit.   Subjective Assessment - 07/28/21 1517     Subjective Pt reports that exercises at home are helping. 2/10 pain knee and achilles.    Currently in Pain? Yes    Pain Score 2     Pain Location Knee    Pain Orientation Right;Left    Pain Onset More than a month ago                                [facility] Adult PT Treatment/Exercise - 07/29/21 0001       Ambulation/Gait   Stairs Yes    Stairs Assistance --   B HHA, push up with force asc reciprocal, desc step to RLE lead secondary to knee pain descending.   Gait Comments backward walking x30 ft B HHA      Ankle Exercises: Seated   Heel Raises 15 reps    Toe Raise 15 reps    Other Seated Ankle Exercises patella joint mobs, circle wobble board.      Ankle Exercises: Standing   Other Standing Ankle Exercises step stand 6" box, 2x30 sec, trial 1 decreased WB on elevated leg, trial 2 lean fwd on elevated leg for increased stretch.    Other Standing Ankle Exercises NBOS airex 2x45 sec.                    PT Education - 07/29/21 0959     Education Details HEP: push up on stairs and step to descending for improved mobility with decreased pain.  Person(s) Educated Patient    Methods Explanation;Demonstration    Comprehension Verbalized understanding;Returned demonstration              PT Short Term Goals - 07/23/21 0935       PT SHORT TERM GOAL #1   Title Patient will be independent with HEP in order to improve functional outcomes.    Time 3    Period Weeks    Status New    Target Date 08/13/21      PT SHORT TERM GOAL #2   Title Patient will report at least 25% improvement in symptoms for improved quality of life.    Baseline 8/10 left achilles pain with standing/walking    Time 3    Period Weeks    Status New    Target Date 08/13/21      PT SHORT TERM GOAL #3   Title Improved strength as evidenced by 15 sec 5xSTS test    Baseline 26 sec    Time 3    Period Weeks    Status New    Target Date 08/13/21               PT Long Term Goals - 07/23/21 0937       PT LONG TERM GOAL #1   Title Patient will improve FOTO score by at least 10 points in order to indicate improved tolerance to activity.    Baseline 33.3% function    Time 6    Period  Weeks    Status New    Target Date 09/03/21      PT LONG TERM GOAL #2   Title Improved gait velocity as evidenced by distance of 200 ft during 2MWT    Baseline 150 ft with antalgia    Time 6    Period Weeks    Status New    Target Date 09/03/21      PT LONG TERM GOAL #3   Title Patient will report at least 50% improvement in symptoms for improved quality of life.    Baseline 8/10 pain left ankle    Time 6    Period Weeks    Status New    Target Date 09/03/21                   Plan - 07/29/21 1000     Clinical Impression Statement Great participation with step stance and good WB throughout session allowing for improved symmetrical gross motor skills. Demo good tibial translation and DF ROM during weight shifting in step stance. Cont difficulty with knee pain descending stairs, no pain in step to, discussed completing safely at home in step to to prevent falls. Cont address strength and functional mobility for improved funciton and monility.    Personal Factors and Comorbidities Age;Time since onset of injury/illness/exacerbation;Past/Current Experience    Examination-Activity Limitations Carry;Squat;Stairs;Stand;Lift;Locomotion Level;Transfers    Stability/Clinical Decision Making Stable/Uncomplicated    Rehab Potential Good    PT Frequency 2x / week    PT Duration 6 weeks    PT Treatment/Interventions ADLs/Self Care Home Management;Aquatic Therapy;Electrical Stimulation;Ultrasound;Moist Heat;Gait training;Stair training;Functional mobility training;Therapeutic activities;Therapeutic exercise;Balance training;Orthotic Fit/Training;Neuromuscular re-education;Manual techniques;Scar mobilization;Passive range of motion;Taping;Splinting;Dry needling;Joint Manipulations    PT Next Visit Plan Continue with left achilles tendinopathy tx, add right knee ROM/strength exercises    PT Home Exercise Plan calf stretch with towel, wall stretch for calf; 07/25/21: ankle circles, heel raise,  quad sets. 8/8: step to descending stairs at home, step stance balance.  Consulted and Agree with Plan of Care Patient             Patient will benefit from skilled therapeutic intervention in order to improve the following deficits and impairments:  Abnormal gait, Decreased activity tolerance, Decreased balance, Decreased mobility, Difficulty walking, Improper body mechanics, Pain, Obesity, Decreased endurance  Visit Diagnosis: Difficulty in walking, not elsewhere classified  Muscle weakness (generalized)  Other symptoms and signs involving the musculoskeletal system  Tendonitis, Achilles, left     Problem List Patient Active Problem List   Diagnosis Date Noted   s/p right knee arthroscopy lateral meniscectomy 04/29/21 05/06/2021   Old complex tear of lateral meniscus of right knee    Diverticulitis of colon 09/15/2017   Diverticulitis 05/24/2017   AKI (acute kidney injury) ([place]) 05/24/2017   Hypokalemia 05/24/2017   Essential hypertension 05/24/2017   Vertigo 05/24/2017   CAP (community acquired pneumonia) 05/04/2015   Sepsis ([place]) 05/02/2015   Acute respiratory failure ([place]) 05/02/2015   10:03 AM,07/29/21 Domenic Moras, PT, DPT Physical Therapist at [facility] [place], [place], [address] Phone: [phone]   Fax:  [phone]  Name: [name] MRN: [id number] Date of Birth: 07/21/1956

## 2021-07-30 ENCOUNTER — Ambulatory Visit (HOSPITAL_COMMUNITY): Payer: Medicare Other | Admitting: Physical Therapy

## 2021-07-30 ENCOUNTER — Other Ambulatory Visit: Payer: Self-pay

## 2021-07-30 DIAGNOSIS — R262 Difficulty in walking, not elsewhere classified: Secondary | ICD-10-CM

## 2021-07-30 DIAGNOSIS — M7662 Achilles tendinitis, left leg: Secondary | ICD-10-CM

## 2021-07-30 DIAGNOSIS — R29898 Other symptoms and signs involving the musculoskeletal system: Secondary | ICD-10-CM

## 2021-07-30 DIAGNOSIS — M6281 Muscle weakness (generalized): Secondary | ICD-10-CM

## 2021-07-30 NOTE — Therapy (Signed)
[place], [place], [address] Phone: [phone]   Fax:  [phone]  Physical Therapy Treatment  Patient Details  Name: [name] MRN: [id number] Date of Birth: 07-18-1956 Referring Provider (PT): Carole Civil, MD   Encounter Date: 07/30/2021   PT End of Session - 07/30/21 1721     Visit Number 4    Number of Visits 12    Date for PT Re-Evaluation 09/03/21    Authorization Type [employer] Medicare    Progress Note Due on Visit 10    PT Start Time 1450    PT Stop Time 1530    PT Time Calculation (min) 40 min    Activity Tolerance Patient tolerated treatment well;No increased pain    Behavior During Therapy WFL for tasks assessed/performed             Past Medical History:  Diagnosis Date   Anxiety    Asthma    Chronic knee pain    Diverticulitis    Headache    Hypertension    Seasonal allergies    Vertigo     Past Surgical History:  Procedure Laterality Date   ABDOMINAL HYSTERECTOMY     ABLATION COLPOCLESIS     CHOLECYSTECTOMY     COLONOSCOPY WITH PROPOFOL N/A 10/04/2017   Procedure: COLONOSCOPY WITH PROPOFOL;  Surgeon: Rogene Houston, MD;  [facility];  Service: Endoscopy;  Laterality: N/A;  1:15   KNEE ARTHROSCOPY WITH LATERAL MENISECTOMY Right 04/29/2021   Procedure: RIGHT KNEE ARTHROSCOPY WITH LATERAL MENISCECTOMY;  Surgeon: Carole Civil, MD;  Location: [facility];  Service: Orthopedics;  Laterality: Right;   POLYPECTOMY  10/04/2017   Procedure: POLYPECTOMY;  Surgeon: Rogene Houston, MD;  [facility];  Service: Endoscopy;;  sigmoid colon polyp    There were no vitals filed for this visit.   Subjective Assessment - 07/30/21 1522     Subjective pt reports compliance with HEP.  STates she is having minimal pain today in Lt achilles    Currently in Pain? Yes    Pain Score 2     Pain Location Ankle    Pain Orientation Left    Pain Descriptors /  Indicators Aching;Sore;Tender                               [facility] Adult PT Treatment/Exercise - 07/30/21 0001       Ankle Exercises: Seated   Heel Raises --    Toe Raise --      Ankle Exercises: Aerobic   Stationary Bike Full revolution seat 3 x 4 min L2      Ankle Exercises: Standing   Vector Stance Right;Left;5 reps;5 seconds;Limitations   1 HHA   Heel Raises 15 reps   incline   Toe Raise 15 reps   incline   Other Standing Ankle Exercises 6" forward and lateral step ups with 1 UE assist      Ankle Exercises: Stretches   Plantar Fascia Stretch 3 reps;30 seconds;Limitations   Lt LE only   Slant Board Stretch 3 reps;30 seconds      Ankle Exercises: Machines for Strengthening   Cybex Leg Press 3 plates bilaterally [id number]                    PT Education - 07/30/21 1723     Education Details See assessment regarding  quesions of working on UE/LE [employer]) Educated Patient    Methods Explanation    Comprehension Verbalized understanding              PT Short Term Goals - 07/23/21 0935       PT SHORT TERM GOAL #1   Title Patient will be independent with HEP in order to improve functional outcomes.    Time 3    Period Weeks    Status New    Target Date 08/13/21      PT SHORT TERM GOAL #2   Title Patient will report at least 25% improvement in symptoms for improved quality of life.    Baseline 8/10 left achilles pain with standing/walking    Time 3    Period Weeks    Status New    Target Date 08/13/21      PT SHORT TERM GOAL #3   Title Improved strength as evidenced by 15 sec 5xSTS test    Baseline 26 sec    Time 3    Period Weeks    Status New    Target Date 08/13/21               PT Long Term Goals - 07/23/21 0937       PT LONG TERM GOAL #1   Title Patient will improve FOTO score by at least 10 points in order to indicate improved tolerance to activity.    Baseline 33.3% function    Time 6    Period Weeks     Status New    Target Date 09/03/21      PT LONG TERM GOAL #2   Title Improved gait velocity as evidenced by distance of 200 ft during 2MWT    Baseline 150 ft with antalgia    Time 6    Period Weeks    Status New    Target Date 09/03/21      PT LONG TERM GOAL #3   Title Patient will report at least 50% improvement in symptoms for improved quality of life.    Baseline 8/10 pain left ankle    Time 6    Period Weeks    Status New    Target Date 09/03/21                   Plan - 07/30/21 1722     Clinical Impression Statement Pain level remains the same for Lt Achilles.  Continues with established program with addition of forward step ups and plantar fascia stretch.  Pt with questions regarding ability to complete UE/LE strengthening at the gym.  Explained she could do this without restrictions, however if any discomfort noted in Lt ankle to discontinue.  Added leg press with 3Pl with ability to complete without pain or issues.  Also added vector stance to work on stability of bil LE's.  Pt c/o Lt ankle discomfort when in Rt LE weightbearing and suspending Lt; no pain while weightbearing through Lt LE.    Personal Factors and Comorbidities Age;Time since onset of injury/illness/exacerbation;Past/Current Experience    Examination-Activity Limitations Carry;Squat;Stairs;Stand;Lift;Locomotion Level;Transfers    Stability/Clinical Decision Making Stable/Uncomplicated    Rehab Potential Good    PT Frequency 2x / week    PT Duration 6 weeks    PT Treatment/Interventions ADLs/Self Care Home Management;Aquatic Therapy;Electrical Stimulation;Ultrasound;Moist Heat;Gait training;Stair training;Functional mobility training;Therapeutic activities;Therapeutic exercise;Balance training;Orthotic Fit/Training;Neuromuscular re-education;Manual techniques;Scar mobilization;Passive range of motion;Taping;Splinting;Dry needling;Joint Manipulations    PT Next Visit  Plan Continue with left achilles  tendinopathy tx.  Progress right knee ROM/strength exercises    PT Home Exercise Plan calf stretch with towel, wall stretch for calf; 07/25/21: ankle circles, heel raise, quad sets. 8/8: step to descending stairs at home, step stance balance.    Consulted and Agree with Plan of Care Patient             Patient will benefit from skilled therapeutic intervention in order to improve the following deficits and impairments:  Abnormal gait, Decreased activity tolerance, Decreased balance, Decreased mobility, Difficulty walking, Improper body mechanics, Pain, Obesity, Decreased endurance  Visit Diagnosis: Difficulty in walking, not elsewhere classified  Muscle weakness (generalized)  Tendonitis, Achilles, left  Other symptoms and signs involving the musculoskeletal system     Problem List Patient Active Problem List   Diagnosis Date Noted   s/p right knee arthroscopy lateral meniscectomy 04/29/21 05/06/2021   Old complex tear of lateral meniscus of right knee    Diverticulitis of colon 09/15/2017   Diverticulitis 05/24/2017   AKI (acute kidney injury) ([place]) 05/24/2017   Hypokalemia 05/24/2017   Essential hypertension 05/24/2017   Vertigo 05/24/2017   CAP (community acquired pneumonia) 05/04/2015   Sepsis ([place]) 05/02/2015   Acute respiratory failure ([place]) 05/02/2015   Teena Irani, PTA/CLT [phone]  Teena Irani 07/30/2021, 5:23 PM  [facility] [address] [place], [place], [address] Phone: [phone]   Fax:  [phone]  Name: [name] MRN: [id number] Date of Birth: 10-09-1956

## 2021-08-05 ENCOUNTER — Other Ambulatory Visit: Payer: Self-pay

## 2021-08-05 ENCOUNTER — Ambulatory Visit (HOSPITAL_COMMUNITY): Payer: Medicare Other | Admitting: Physical Therapy

## 2021-08-05 DIAGNOSIS — R262 Difficulty in walking, not elsewhere classified: Secondary | ICD-10-CM | POA: Diagnosis not present

## 2021-08-05 DIAGNOSIS — M7662 Achilles tendinitis, left leg: Secondary | ICD-10-CM

## 2021-08-05 DIAGNOSIS — R29898 Other symptoms and signs involving the musculoskeletal system: Secondary | ICD-10-CM

## 2021-08-05 DIAGNOSIS — M6281 Muscle weakness (generalized): Secondary | ICD-10-CM

## 2021-08-05 NOTE — Patient Instructions (Signed)
Access Code: [email or web address] URL: [email or web address] Date: 08/05/2021 Prepared by: Josue Hector  Exercises Prone Quadriceps Stretch with Strap - 2-3 x daily - 7 x weekly - 1 sets - 4 reps - 30 seconds hold Standing Bilateral Gastroc Stretch with Step - 2-3 x daily - 7 x weekly - 1 sets - 4 reps - 30 seconds hold Supine Bridge - 2-3 x daily - 7 x weekly - 2 sets - 10 reps Sidelying Hip Abduction - 2-3 x daily - 7 x weekly - 2 sets - 10 reps Single Leg Stance with Support - 2-3 x daily - 7 x weekly - 1 sets - 4 reps - 15 second hold

## 2021-08-06 NOTE — Therapy (Signed)
[place], [place], [address] Phone: [phone]   Fax:  [phone]  Physical Therapy Treatment  Patient Details  Name: [name] MRN: [id number] Date of Birth: 06/28/1956 Referring Provider (PT): Carole Civil, MD   Encounter Date: 08/05/2021   PT End of Session - 08/05/21 1723     Visit Number 5    Number of Visits 12    Date for PT Re-Evaluation 09/03/21    Authorization Type UHC Medicare    Progress Note Due on Visit 10    PT Start Time 1715    PT Stop Time 1805    PT Time Calculation (min) 50 min    Activity Tolerance Patient tolerated treatment well;No increased pain    Behavior During Therapy WFL for tasks assessed/performed             Past Medical History:  Diagnosis Date   Anxiety    Asthma    Chronic knee pain    Diverticulitis    Headache    Hypertension    Seasonal allergies    Vertigo     Past Surgical History:  Procedure Laterality Date   ABDOMINAL HYSTERECTOMY     ABLATION COLPOCLESIS     CHOLECYSTECTOMY     COLONOSCOPY WITH PROPOFOL N/A 10/04/2017   Procedure: COLONOSCOPY WITH PROPOFOL;  Surgeon: Rogene Houston, MD;  [facility];  Service: Endoscopy;  Laterality: N/A;  1:15   KNEE ARTHROSCOPY WITH LATERAL MENISECTOMY Right 04/29/2021   Procedure: RIGHT KNEE ARTHROSCOPY WITH LATERAL MENISCECTOMY;  Surgeon: Carole Civil, MD;  Location: [facility];  Service: Orthopedics;  Laterality: Right;   POLYPECTOMY  10/04/2017   Procedure: POLYPECTOMY;  Surgeon: Rogene Houston, MD;  [facility];  Service: Endoscopy;;  sigmoid colon polyp    There were no vitals filed for this visit.   Subjective Assessment - 08/05/21 1722     Subjective Feels the exercises are helping but thinks her bone spur is growing. Mild pain ongoing in ankle and RT knee.    Currently in Pain? Yes    Pain Score 4     Pain Location Ankle    Pain Orientation Left    Pain  Descriptors / Indicators Aching;Sore    Pain Type Chronic pain                               [facility] Adult PT Treatment/Exercise - 08/06/21 0001       Ankle Exercises: Stretches   Slant Board Stretch 3 reps;30 seconds    Other Stretch prone quad stretch 3 x 30"      Ankle Exercises: Standing   SLS 3 x 10"  solid floor    Heel Raises 20 reps   incline slope   Other Standing Ankle Exercises 4 inch box step ups 2 x 10 HHA x 1      Ankle Exercises: Supine   Other Supine Ankle Exercises bridge 2 x 10      Ankle Exercises: Sidelying   Other Sidelying Ankle Exercises hip abduction 2 x 10      Ankle Exercises: Aerobic   Stationary Bike Lv 1 5 min warmup                      PT Short Term Goals - 07/23/21 0935       PT SHORT  TERM GOAL #1   Title Patient will be independent with HEP in order to improve functional outcomes.    Time 3    Period Weeks    Status New    Target Date 08/13/21      PT SHORT TERM GOAL #2   Title Patient will report at least 25% improvement in symptoms for improved quality of life.    Baseline 8/10 left achilles pain with standing/walking    Time 3    Period Weeks    Status New    Target Date 08/13/21      PT SHORT TERM GOAL #3   Title Improved strength as evidenced by 15 sec 5xSTS test    Baseline 26 sec    Time 3    Period Weeks    Status New    Target Date 08/13/21               PT Long Term Goals - 07/23/21 0937       PT LONG TERM GOAL #1   Title Patient will improve FOTO score by at least 10 points in order to indicate improved tolerance to activity.    Baseline 33.3% function    Time 6    Period Weeks    Status New    Target Date 09/03/21      PT LONG TERM GOAL #2   Title Improved gait velocity as evidenced by distance of 200 ft during 2MWT    Baseline 150 ft with antalgia    Time 6    Period Weeks    Status New    Target Date 09/03/21      PT LONG TERM GOAL #3   Title Patient will  report at least 50% improvement in symptoms for improved quality of life.    Baseline 8/10 pain left ankle    Time 6    Period Weeks    Status New    Target Date 09/03/21                   Plan - 08/06/21 0840     Clinical Impression Statement Assessed hip MMT per patient subjective complaints. Found notable weakness is abductors as well as bilateral quad flexibility restriction which is likely contributing to LT ankle pain as well as RT knee. Discussed findings with patient. Modified ther ex to address more proximal chain issues. Also explained benefit of hold times for calf stretching and addition of single leg stance position for improving ankle stabilization as well as balance. Patient reports decreased subjective pain post session, and improved ability to squat, ambulate stairs. Issued updated HEP handout. Patient will continue to benefit from skilled therapy services to reduce deficits and improve functional ability.    Personal Factors and Comorbidities Age;Time since onset of injury/illness/exacerbation;Past/Current Experience    Examination-Activity Limitations Carry;Squat;Stairs;Stand;Lift;Locomotion Level;Transfers    Stability/Clinical Decision Making Stable/Uncomplicated    Rehab Potential Good    PT Frequency 2x / week    PT Duration 6 weeks    PT Treatment/Interventions ADLs/Self Care Home Management;Aquatic Therapy;Electrical Stimulation;Ultrasound;Moist Heat;Gait training;Stair training;Functional mobility training;Therapeutic activities;Therapeutic exercise;Balance training;Orthotic Fit/Training;Neuromuscular re-education;Manual techniques;Scar mobilization;Passive range of motion;Taping;Splinting;Dry needling;Joint Manipulations    PT Next Visit Plan Continue with left achilles tendinopathy tx.  Progress right knee ROM/strength exercises. Sidestepping, eccentric step downs, rocker board    PT Home Exercise Plan calf stretch with towel, wall stretch for calf; 07/25/21:  ankle circles, heel raise, quad sets. 8/8: step to descending stairs at home,  step stance balance. 8/17 prone quad stretch, bridge, sidelying hip abduction, single leg stance    Consulted and Agree with Plan of Care Patient             Patient will benefit from skilled therapeutic intervention in order to improve the following deficits and impairments:  Abnormal gait, Decreased activity tolerance, Decreased balance, Decreased mobility, Difficulty walking, Improper body mechanics, Pain, Obesity, Decreased endurance  Visit Diagnosis: Difficulty in walking, not elsewhere classified  Muscle weakness (generalized)  Tendonitis, Achilles, left  Other symptoms and signs involving the musculoskeletal system     Problem List Patient Active Problem List   Diagnosis Date Noted   s/p right knee arthroscopy lateral meniscectomy 04/29/21 05/06/2021   Old complex tear of lateral meniscus of right knee    Diverticulitis of colon 09/15/2017   Diverticulitis 05/24/2017   AKI (acute kidney injury) ([place]) 05/24/2017   Hypokalemia 05/24/2017   Essential hypertension 05/24/2017   Vertigo 05/24/2017   CAP (community acquired pneumonia) 05/04/2015   Sepsis ([place]) 05/02/2015   Acute respiratory failure ([place]) 05/02/2015   8:51 AM, 08/06/21 Josue Hector PT DPT  Physical Therapist with [facility]  [phone] [place] [address] [place], [place], [address] Phone: [phone]   Fax:  [phone]  Name: [name] MRN: [id number] Date of Birth: [birth year]

## 2021-08-08 ENCOUNTER — Other Ambulatory Visit: Payer: Self-pay

## 2021-08-08 ENCOUNTER — Encounter (HOSPITAL_COMMUNITY): Payer: Self-pay

## 2021-08-08 ENCOUNTER — Ambulatory Visit (HOSPITAL_COMMUNITY): Payer: Medicare Other

## 2021-08-08 DIAGNOSIS — R29898 Other symptoms and signs involving the musculoskeletal system: Secondary | ICD-10-CM

## 2021-08-08 DIAGNOSIS — R262 Difficulty in walking, not elsewhere classified: Secondary | ICD-10-CM | POA: Diagnosis not present

## 2021-08-08 DIAGNOSIS — M6281 Muscle weakness (generalized): Secondary | ICD-10-CM

## 2021-08-08 DIAGNOSIS — M7662 Achilles tendinitis, left leg: Secondary | ICD-10-CM

## 2021-08-08 NOTE — Therapy (Addendum)
[place], [place], [address] Phone: [phone]   Fax:  [phone]  ADDENDUM 08/13/21: Per referring physician, pt had follow up appointment on 08/11/21 and pt is satisfied with current functional status and wants to d/c to HEP and progress at home independently. Will d/c from PT at this time.  PHYSICAL THERAPY DISCHARGE SUMMARY  Visits from Start of Care: 6  Current functional level related to goals / functional outcomes: Pt is pleased with current functional status.   Remaining deficits: Pt is pleased with current functional status.   Education / Equipment: HEP   Patient agrees to discharge. Patient goals were partially met. Patient is being discharged due to being pleased with the current functional level.  Talbot Grumbling PT, DPT 08/13/21, 8:40 AM    Physical Therapy Treatment  Patient Details  Name: [name] MRN: [id number] Date of Birth: 01-26-1956 Referring Provider (PT): Carole Civil, MD   Encounter Date: 08/08/2021   PT End of Session - 08/08/21 1026     Visit Number 6    Number of Visits 12    Date for PT Re-Evaluation 09/03/21    Authorization Type [employer] Medicare    Progress Note Due on Visit 10    PT Start Time 0957    PT Stop Time 1036    PT Time Calculation (min) 39 min    Activity Tolerance Patient tolerated treatment well;No increased pain    Behavior During Therapy WFL for tasks assessed/performed             Past Medical History:  Diagnosis Date   Anxiety    Asthma    Chronic knee pain    Diverticulitis    Headache    Hypertension    Seasonal allergies    Vertigo     Past Surgical History:  Procedure Laterality Date   ABDOMINAL HYSTERECTOMY     ABLATION COLPOCLESIS     CHOLECYSTECTOMY     COLONOSCOPY WITH PROPOFOL N/A 10/04/2017   Procedure: COLONOSCOPY WITH PROPOFOL;  Surgeon: Rogene Houston, MD;  [facility];  Service: Endoscopy;   Laterality: N/A;  1:15   KNEE ARTHROSCOPY WITH LATERAL MENISECTOMY Right 04/29/2021   Procedure: RIGHT KNEE ARTHROSCOPY WITH LATERAL MENISCECTOMY;  Surgeon: Carole Civil, MD;  Location: [facility];  Service: Orthopedics;  Laterality: Right;   POLYPECTOMY  10/04/2017   Procedure: POLYPECTOMY;  Surgeon: Rogene Houston, MD;  Location: [facility];  Service: Endoscopy;;  sigmoid colon polyp    There were no vitals filed for this visit.   Subjective Assessment - 08/08/21 0959     Subjective Pt reports a little sore after last session exercises, but nothing bad. Pt reports no pain today.    Currently in Pain? No/denies                  [facility] Adult PT Treatment/Exercise - 08/08/21 0001       Ankle Exercises: Stretches   Soleus Stretch 2 reps;20 seconds    Gastroc Stretch --    Press photographer Limitations --    Slant Board Stretch 3 reps;30 seconds    Other Stretch prone quad stretch 3 x 30", bil      Ankle Exercises: Standing   Rocker Board --   DF/PF and R/L in sitting, x30 seconds each direction, no pain   Other Standing Ankle Exercises step up forward 6" box, 10 reps each LE; step up sideways  6" box, 10 reps each LE      Ankle Exercises: Supine   Other Supine Ankle Exercises bridge 2 x 10      Ankle Exercises: Sidelying   Other Sidelying Ankle Exercises Clams (ER) and reverse clams (IR), 10 reps each for BLE                    PT Education - 08/08/21 1026     Education Details Exercise technique, continue HEP    Person(s) Educated Patient    Methods Explanation    Comprehension Verbalized understanding              PT Short Term Goals - 08/08/21 1041       PT SHORT TERM GOAL #1   Title Patient will be independent with HEP in order to improve functional outcomes.    Time 3    Period Weeks    Status On-going    Target Date 08/13/21      PT SHORT TERM GOAL #2   Title Patient will report at least 25% improvement in symptoms for improved  quality of life.    Baseline 8/10 left achilles pain with standing/walking    Time 3    Period Weeks    Status On-going    Target Date 08/13/21      PT SHORT TERM GOAL #3   Title Improved strength as evidenced by 15 sec 5xSTS test    Baseline 26 sec    Time 3    Period Weeks    Status On-going    Target Date 08/13/21               PT Long Term Goals - 08/08/21 1041       PT LONG TERM GOAL #1   Title Patient will improve FOTO score by at least 10 points in order to indicate improved tolerance to activity.    Baseline 33.3% function    Time 6    Period Weeks    Status On-going      PT LONG TERM GOAL #2   Title Improved gait velocity as evidenced by distance of 200 ft during 2MWT    Baseline 150 ft with antalgia    Time 6    Period Weeks    Status On-going      PT LONG TERM GOAL #3   Title Patient will report at least 50% improvement in symptoms for improved quality of life.    Baseline 8/10 pain left ankle    Time 6    Period Weeks    Status On-going                   Plan - 08/08/21 1026     Clinical Impression Statement Began with stretching and continued strengthening. Pt denies pain with all mobility, good muscle activation noted with step ups and performs without closed chain weakness noted. Ended with rockerboard in sitting for gentle ROM with good tolerance. Continue to progress as able.    Personal Factors and Comorbidities Age;Time since onset of injury/illness/exacerbation;Past/Current Experience    Examination-Activity Limitations Carry;Squat;Stairs;Stand;Lift;Locomotion Level;Transfers    Stability/Clinical Decision Making Stable/Uncomplicated    Rehab Potential Good    PT Frequency 2x / week    PT Duration 6 weeks    PT Treatment/Interventions ADLs/Self Care Home Management;Aquatic Therapy;Electrical Stimulation;Ultrasound;Moist Heat;Gait training;Stair training;Functional mobility training;Therapeutic activities;Therapeutic  exercise;Balance training;Orthotic Fit/Training;Neuromuscular re-education;Manual techniques;Scar mobilization;Passive range of motion;Taping;Splinting;Dry needling;Joint Manipulations    PT Next  Visit Plan Continue with left achilles tendinopathy tx.  Progress right knee ROM/strength exercises (Sidestepping, eccentric step downs, rocker board)    PT Home Exercise Plan calf stretch with towel, wall stretch for calf; 07/25/21: ankle circles, heel raise, quad sets. 8/8: step to descending stairs at home, step stance balance. 8/17 prone quad stretch, bridge, sidelying hip abduction, single leg stance    Consulted and Agree with Plan of Care Patient             Patient will benefit from skilled therapeutic intervention in order to improve the following deficits and impairments:  Abnormal gait, Decreased activity tolerance, Decreased balance, Decreased mobility, Difficulty walking, Improper body mechanics, Pain, Obesity, Decreased endurance  Visit Diagnosis: Difficulty in walking, not elsewhere classified  Muscle weakness (generalized)  Other symptoms and signs involving the musculoskeletal system  Tendonitis, Achilles, left     Problem List Patient Active Problem List   Diagnosis Date Noted   s/p right knee arthroscopy lateral meniscectomy 04/29/21 05/06/2021   Old complex tear of lateral meniscus of right knee    Diverticulitis of colon 09/15/2017   Diverticulitis 05/24/2017   AKI (acute kidney injury) ([place]) 05/24/2017   Hypokalemia 05/24/2017   Essential hypertension 05/24/2017   Vertigo 05/24/2017   CAP (community acquired pneumonia) 05/04/2015   Sepsis ([place]) 05/02/2015   Acute respiratory failure ([place]) 05/02/2015     Tori Duncan Alejandro PT, DPT 08/08/21, 10:42 AM    [place] [address] [place], [place], [address] Phone: [phone]   Fax:  [phone]  Name: [name] MRN: [id number] Date of Birth: 1956-05-22

## 2021-08-11 ENCOUNTER — Ambulatory Visit (INDEPENDENT_AMBULATORY_CARE_PROVIDER_SITE_OTHER): Payer: Medicare Other | Admitting: Orthopedic Surgery

## 2021-08-11 ENCOUNTER — Encounter: Payer: Self-pay | Admitting: Orthopedic Surgery

## 2021-08-11 ENCOUNTER — Other Ambulatory Visit: Payer: Self-pay

## 2021-08-11 VITALS — BP 137/79 | HR 85 | Ht 60.0 in | Wt 180.0 lb

## 2021-08-11 DIAGNOSIS — M79672 Pain in left foot: Secondary | ICD-10-CM | POA: Diagnosis not present

## 2021-08-11 DIAGNOSIS — Z9889 Other specified postprocedural states: Secondary | ICD-10-CM | POA: Diagnosis not present

## 2021-08-11 NOTE — Progress Notes (Signed)
Chief Complaint  Patient presents with   Foot Pain    Achilles pain left/ a little better but still painful    Post-op Follow-up    Right knee scope 04/29/21 feels better   H/o achilles tendonitis; treated with injections and cam walker now has had PT (about 6 visits)  She says her knee feels really good and her ankle is better   We discussed her therapy and she thinks she can stop formal PT and do it at home   I agree  The knee exam is normal and the ankle exam   Shes still tender over the pump bump but feels pain only going down stairs   Adv: continue the stretching, she cant tolerate ice   Fu prn

## 2021-08-12 ENCOUNTER — Encounter (HOSPITAL_COMMUNITY): Payer: Medicare Other | Admitting: Physical Therapy

## 2021-08-14 ENCOUNTER — Encounter (HOSPITAL_COMMUNITY): Payer: Medicare Other

## 2021-08-19 ENCOUNTER — Encounter (HOSPITAL_COMMUNITY): Payer: Medicare Other | Admitting: Physical Therapy

## 2021-08-21 ENCOUNTER — Ambulatory Visit (HOSPITAL_COMMUNITY): Payer: Medicare Other

## 2021-08-21 ENCOUNTER — Other Ambulatory Visit: Payer: Medicare Other | Admitting: Obstetrics & Gynecology

## 2021-08-22 ENCOUNTER — Encounter (HOSPITAL_COMMUNITY): Payer: Medicare Other | Admitting: Physical Therapy

## 2021-08-26 ENCOUNTER — Encounter (HOSPITAL_COMMUNITY): Payer: Medicare Other | Admitting: Physical Therapy

## 2021-08-28 ENCOUNTER — Encounter (HOSPITAL_COMMUNITY): Payer: Medicare Other

## 2021-09-01 ENCOUNTER — Ambulatory Visit (HOSPITAL_COMMUNITY)
Admission: RE | Admit: 2021-09-01 | Discharge: 2021-09-01 | Disposition: A | Discharge status: Home / Self Care | Payer: Medicare Other | Source: Ambulatory Visit | Attending: Family Medicine | Admitting: Family Medicine

## 2021-09-01 ENCOUNTER — Other Ambulatory Visit: Payer: Self-pay

## 2021-09-01 DIAGNOSIS — Z1231 Encounter for screening mammogram for malignant neoplasm of breast: Secondary | ICD-10-CM | POA: Diagnosis not present

## 2021-09-02 ENCOUNTER — Encounter (HOSPITAL_COMMUNITY): Payer: Medicare Other

## 2021-09-04 ENCOUNTER — Encounter (HOSPITAL_COMMUNITY): Payer: Medicare Other

## 2021-10-06 ENCOUNTER — Other Ambulatory Visit: Payer: Self-pay

## 2021-10-06 ENCOUNTER — Ambulatory Visit (INDEPENDENT_AMBULATORY_CARE_PROVIDER_SITE_OTHER): Payer: Medicare Other | Admitting: Obstetrics & Gynecology

## 2021-10-06 ENCOUNTER — Encounter: Payer: Self-pay | Admitting: Obstetrics & Gynecology

## 2021-10-06 VITALS — Ht 60.0 in | Wt 180.4 lb

## 2021-10-06 DIAGNOSIS — Z01419 Encounter for gynecological examination (general) (routine) without abnormal findings: Secondary | ICD-10-CM | POA: Diagnosis not present

## 2021-10-06 NOTE — Progress Notes (Signed)
   WELL-WOMAN EXAMINATION Patient name: [name] MRN [id number]  Date of birth: 08/20/1956 Chief Complaint:   Gynecologic Exam  History of Present Illness:   [name] is a 65 y.o. G3P2010 PM, [place] female being seen today for a routine well-woman exam.   Today she notes the following concerns:  Sexual health: Not sexually active x 43yrs (husband past away).  She has been "talking" to a gentlemen for several years, but is afraid to be intimate.   No LMP recorded. Patient has had a hysterectomy. Denies issues with her menses The current method of family planning is  hysterectomy .    Last pap not indicated.  Last mammogram: up to date. Last colonoscopy: up to date  Depression screen [facility] 2/9 10/06/2021  Decreased Interest 0  Down, Depressed, Hopeless 0  PHQ - 2 Score 0  Altered sleeping 0  Tired, decreased energy 0  Change in appetite 0  Feeling bad or failure about yourself  1  Trouble concentrating 0  Moving slowly or fidgety/restless 2  Suicidal thoughts 0  PHQ-9 Score 3    Review of Systems:   Pertinent items are noted in HPI Denies any headaches, blurred vision, fatigue, shortness of breath, chest pain, abdominal pain, bowel movements, urination, or intercourse unless otherwise stated above.  Pertinent History Reviewed:  Reviewed past medical,surgical, social and family history.  Reviewed problem list, medications and allergies. Physical Assessment:   Vitals:   10/06/21 1014  Weight: 180 lb 6.4 oz (81.8 kg)  Height: 5' (1.524 m)  Body mass index is 35.23 kg/m.        Physical Examination:   General appearance - well appearing, and in no distress  Mental status - alert, oriented to person, place, and time  Psych:  She has a normal mood and affect  Skin - warm and dry, normal color, no suspicious lesions noted  Chest - effort normal, all lung fields clear to auscultation bilaterally  Heart - normal rate and regular rhythm  Neck:  midline trachea, no  thyromegaly or nodules  Breasts - breasts appear normal, no suspicious masses, no skin or nipple changes or  axillary nodes  Abdomen - obese, soft, nontender, nondistended, no masses or organomegaly  Pelvic - VULVA: normal appearing vulva with no masses, tenderness or lesions  VAGINA: normal appearing vagina with normal color and discharge, no lesions , uterus and cervix surgically absent ADNEXA: No adnexal masses or tenderness noted.  Rectal - deferred. Colonoscopy up to date  Extremities:  minimal edema  Chaperone: Celene Squibb     Assessment & Plan:  1) Well-Woman Exam -Reviewed screening guidelines- pap not indicated since hysterectomy completed for benign conditions -encouraged pt to keep up on mammogram screening  2) Sexual health -reviewed questions and concerns -discussed STI screening if/when needed -though minimal atrophy noted- discussed potential for dyspareunia and advised follow up should she note any of these concerns   Meds: No orders of the defined types were placed in this encounter.   Follow-up: Return in about 2 years (around 10/07/2023), or annual.   Janyth Pupa, DO Attending [place], [facility] for [facility], [place]

## 2021-12-12 ENCOUNTER — Telehealth: Payer: Self-pay | Admitting: Orthopedic Surgery
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
## 2021-12-23 ENCOUNTER — Telehealth: Payer: Self-pay | Admitting: Orthopedic Surgery
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
## 2022-01-06 ENCOUNTER — Other Ambulatory Visit (HOSPITAL_COMMUNITY): Payer: Self-pay | Admitting: Nurse Practitioner

## 2022-01-06 ENCOUNTER — Ambulatory Visit (HOSPITAL_COMMUNITY)
Admission: RE | Admit: 2022-01-06 | Discharge: 2022-01-06 | Disposition: A | Discharge status: Home / Self Care | Payer: Medicare Other | Source: Ambulatory Visit | Attending: Nurse Practitioner | Admitting: Nurse Practitioner

## 2022-01-06 ENCOUNTER — Other Ambulatory Visit: Payer: Self-pay

## 2022-01-06 DIAGNOSIS — M79661 Pain in right lower leg: Secondary | ICD-10-CM | POA: Diagnosis present

## 2022-01-06 DIAGNOSIS — M79662 Pain in left lower leg: Secondary | ICD-10-CM | POA: Diagnosis present

## 2022-01-06 DIAGNOSIS — M545 Low back pain, unspecified: Secondary | ICD-10-CM | POA: Diagnosis not present

## 2022-01-10 ENCOUNTER — Encounter (HOSPITAL_COMMUNITY): Payer: Self-pay

## 2022-01-10 ENCOUNTER — Emergency Department (HOSPITAL_COMMUNITY)
Admission: EM | Admit: 2022-01-10 | Discharge: 2022-01-10 | Disposition: A | Discharge status: Home / Self Care | Payer: Medicare Other | Attending: Emergency Medicine | Admitting: Emergency Medicine

## 2022-01-10 DIAGNOSIS — G8929 Other chronic pain: Secondary | ICD-10-CM | POA: Insufficient documentation

## 2022-01-10 DIAGNOSIS — J45909 Unspecified asthma, uncomplicated: Secondary | ICD-10-CM | POA: Insufficient documentation

## 2022-01-10 DIAGNOSIS — M545 Low back pain, unspecified: Secondary | ICD-10-CM | POA: Diagnosis present

## 2022-01-10 DIAGNOSIS — M5440 Lumbago with sciatica, unspecified side: Secondary | ICD-10-CM | POA: Insufficient documentation

## 2022-01-10 DIAGNOSIS — Z79899 Other long term (current) drug therapy: Secondary | ICD-10-CM | POA: Diagnosis not present

## 2022-01-10 DIAGNOSIS — I10 Essential (primary) hypertension: Secondary | ICD-10-CM | POA: Insufficient documentation

## 2022-01-10 DIAGNOSIS — Z91040 Latex allergy status: Secondary | ICD-10-CM | POA: Diagnosis not present

## 2022-01-10 DIAGNOSIS — Z7951 Long term (current) use of inhaled steroids: Secondary | ICD-10-CM | POA: Diagnosis not present

## 2022-01-10 MED ORDER — LIDOCAINE 5 % EX PTCH: 1.0000 | MEDICATED_PATCH | CUTANEOUS | 0 refills | Status: DC

## 2022-01-10 MED ORDER — NAPROXEN 375 MG PO TABS: 375.0000 mg | ORAL_TABLET | Freq: Two times a day (BID) | ORAL | 0 refills | Status: DC

## 2022-01-10 MED ORDER — DEXAMETHASONE SODIUM PHOSPHATE 10 MG/ML IJ SOLN
10.0000 mg | Freq: Once | INTRAMUSCULAR | Status: AC
  Administered 2022-01-10: 10 mg via INTRAMUSCULAR
  Filled 2022-01-10: qty 1

## 2022-01-10 MED ORDER — PREDNISONE 50 MG PO TABS: 50.0000 mg | ORAL_TABLET | Freq: Every day | ORAL | 0 refills | Status: DC

## 2022-01-10 NOTE — Discharge Instructions (Signed)
Take the medications as prescribed for pain.  Follow-up with your primary care doctor as planned to discuss further treatment.
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
## 2022-01-10 NOTE — ED Triage Notes (Signed)
Pt complaining of back and leg pain for "a long time".  Her doctors appointment is Monday but is having continued pain, lowe back and down into legs.

## 2022-01-13 ENCOUNTER — Other Ambulatory Visit (HOSPITAL_COMMUNITY): Payer: Self-pay | Admitting: Nurse Practitioner

## 2022-01-13 ENCOUNTER — Other Ambulatory Visit: Payer: Self-pay | Admitting: Nurse Practitioner

## 2022-01-13 ENCOUNTER — Other Ambulatory Visit: Payer: Self-pay | Admitting: Otolaryngology

## 2022-01-13 DIAGNOSIS — G8929 Other chronic pain: Secondary | ICD-10-CM

## 2022-01-26 ENCOUNTER — Other Ambulatory Visit: Payer: Self-pay

## 2022-01-26 ENCOUNTER — Ambulatory Visit (HOSPITAL_COMMUNITY)
Admission: RE | Admit: 2022-01-26 | Discharge: 2022-01-26 | Disposition: A | Discharge status: Home / Self Care | Payer: Medicare Other | Source: Ambulatory Visit | Attending: Nurse Practitioner | Admitting: Nurse Practitioner

## 2022-01-26 DIAGNOSIS — G8929 Other chronic pain: Secondary | ICD-10-CM | POA: Insufficient documentation

## 2022-01-26 DIAGNOSIS — M5441 Lumbago with sciatica, right side: Secondary | ICD-10-CM | POA: Diagnosis present

## 2022-01-26 DIAGNOSIS — M5442 Lumbago with sciatica, left side: Secondary | ICD-10-CM | POA: Insufficient documentation

## 2022-02-23 ENCOUNTER — Other Ambulatory Visit: Payer: Self-pay | Admitting: Family Medicine

## 2022-02-23 DIAGNOSIS — M5442 Lumbago with sciatica, left side: Secondary | ICD-10-CM

## 2022-03-03 ENCOUNTER — Other Ambulatory Visit: Payer: Medicare Other

## 2022-03-16 ENCOUNTER — Inpatient Hospital Stay
Admission: RE | Admit: 2022-03-16 | Discharge: 2022-03-16 | Disposition: A | Discharge status: Home / Self Care | Payer: Medicare Other | Source: Ambulatory Visit | Attending: Family Medicine | Admitting: Family Medicine

## 2022-03-16 NOTE — Discharge Instructions (Signed)

## 2022-03-17 ENCOUNTER — Inpatient Hospital Stay
Admission: RE | Admit: 2022-03-17 | Discharge: 2022-03-17 | Disposition: A | Discharge status: Home / Self Care | Payer: Medicare Other | Source: Ambulatory Visit | Attending: Family Medicine | Admitting: Family Medicine

## 2022-03-17 NOTE — Discharge Instructions (Signed)

## 2022-05-11 ENCOUNTER — Ambulatory Visit
Admission: RE | Admit: 2022-05-11 | Discharge: 2022-05-11 | Disposition: A | Discharge status: Home / Self Care | Payer: Medicare Other | Source: Ambulatory Visit | Attending: Family Medicine | Admitting: Family Medicine

## 2022-05-11 DIAGNOSIS — M5442 Lumbago with sciatica, left side: Secondary | ICD-10-CM

## 2022-05-11 MED ORDER — METHYLPREDNISOLONE ACETATE 40 MG/ML INJ SUSP (RADIOLOG
80.0000 mg | Freq: Once | INTRAMUSCULAR | Status: AC
  Administered 2022-05-11: 80 mg via EPIDURAL

## 2022-05-11 NOTE — Discharge Instructions (Signed)

## 2022-10-08 ENCOUNTER — Other Ambulatory Visit (HOSPITAL_COMMUNITY): Payer: Self-pay | Admitting: Family Medicine

## 2022-10-08 DIAGNOSIS — Z1231 Encounter for screening mammogram for malignant neoplasm of breast: Secondary | ICD-10-CM

## 2022-10-09 ENCOUNTER — Ambulatory Visit (HOSPITAL_COMMUNITY): Payer: Medicare Other

## 2022-10-13 NOTE — Therapy (Signed)
OUTPATIENT PHYSICAL THERAPY LOWER EXTREMITY EVALUATION   Patient Name: [name] MRN: [id number] DOB:03-28-1956, 66 y.o., female Today's Date: 10/14/2022   PT End of Session - 10/14/22 1529     Visit Number 1    Number of Visits 8    Date for PT Re-Evaluation 11/11/22    Authorization Type [employer] Medicare    Progress Note Due on Visit 10    PT Start Time 1530    PT Stop Time 1600    PT Time Calculation (min) 30 min    Activity Tolerance Patient tolerated treatment well             Past Medical History:  Diagnosis Date   Anxiety    Asthma    Chronic knee pain    Diverticulitis    Headache    Hypertension    Seasonal allergies    Vertigo    Past Surgical History:  Procedure Laterality Date   ABDOMINAL HYSTERECTOMY     ABLATION COLPOCLESIS     CHOLECYSTECTOMY     COLONOSCOPY WITH PROPOFOL N/A 10/04/2017   Procedure: COLONOSCOPY WITH PROPOFOL;  Surgeon: Malissa Hippo, MD;  [facility]: [facility];  Service: Endoscopy;  Laterality: N/A;  1:15   KNEE ARTHROSCOPY WITH LATERAL MENISECTOMY Right 04/29/2021   Procedure: RIGHT KNEE ARTHROSCOPY WITH LATERAL MENISCECTOMY;  Surgeon: Vickki Hearing, MD;  Location: [facility];  Service: Orthopedics;  Laterality: Right;   POLYPECTOMY  10/04/2017   Procedure: POLYPECTOMY;  Surgeon: Malissa Hippo, MD;  [facility]: [facility];  Service: Endoscopy;;  sigmoid colon polyp   Patient Active Problem List   Diagnosis Date Noted   s/p right knee arthroscopy lateral meniscectomy 04/29/21 05/06/2021   Old complex tear of lateral meniscus of right knee    Diverticulitis of colon 09/15/2017   Diverticulitis 05/24/2017   AKI (acute kidney injury) (HCC) 05/24/2017   Hypokalemia 05/24/2017   Essential hypertension 05/24/2017   Vertigo 05/24/2017   CAP (community acquired pneumonia) 05/04/2015   Sepsis (HCC) 05/02/2015   Acute respiratory failure (HCC) 05/02/2015    PCP: Gareth Morgan, MD  REFERRING PROVIDER: Jayme Cloud  Puechl MD  REFERRING DIAG:  PT eval/tx knee pain per Dillard Essex, NP   Referral Notes   THERAPY DIAG:  Chronic pain of left knee  Chronic pain of right knee  Pain in left leg  Pain in right leg  Low back pain, unspecified back pain laterality, unspecified chronicity, unspecified whether sciatica present  Rationale for Evaluation and Treatment Rehabilitation  ONSET DATE: April 2022  SUBJECTIVE:   SUBJECTIVE STATEMENT: Patient reports bilateral knee and leg pain since April of last year; patient states "something happened in my back and my back and legs"  PERTINENT HISTORY: April 2022 had arthroscopic surgery Right knee Dr. Romeo Apple PAIN:  Are you having pain? Yes: NPRS scale: 9/10 Pain location: knees and bilateral leg Pain description: stabbing Aggravating factors: mornings Relieving factors: better as the day goes on, medication  PRECAUTIONS: None  WEIGHT BEARING RESTRICTIONS: No  FALLS:  Has patient fallen in last 6 months? No  LIVING ENVIRONMENT: Lives with: lives with their family Lives in: House/apartment Stairs: Yes: External: 5 steps; on right going up, on left going up, and can reach both Has following equipment at home: Single point cane  OCCUPATION: Retired  PLOF: Independent  PATIENT GOALS: legs to feel better and my back   OBJECTIVE:   DIAGNOSTIC FINDINGS:  CLINICAL DATA:  Low back pain radiating to  both legs.   EXAM: MRI LUMBAR SPINE WITHOUT CONTRAST   TECHNIQUE: Multiplanar, multisequence MR imaging of the lumbar spine was performed. No intravenous contrast was administered.   COMPARISON:  Lumbar spine radiographs 01/06/2022   FINDINGS: Segmentation:  Standard.   Alignment: Facet mediated anterolisthesis of L4 on L5 measuring 3 mm.   Vertebrae: Bilateral facet edema at L4-5 and L5-S1. No fracture or suspicious marrow lesion.   Conus medullaris and cauda equina: Conus extends to the L2 level. Conus and cauda equina  appear normal.   Paraspinal and other soft tissues: Unremarkable.   Disc levels:   Disc desiccation throughout the lumbar spine. Mild disc space narrowing at L2-3, L3-4, and L5-S1 and moderate narrowing at L4-5.   L1-2: Mild facet hypertrophy without disc herniation or stenosis.   L2-3: Mild disc bulging and moderate facet and ligamentum flavum hypertrophy without stenosis.   L3-4: Disc bulging and moderate facet and ligamentum flavum hypertrophy with a small central annular fissure. No significant stenosis.   L4-5: Anterolisthesis with mild bulging of uncovered disc and severe facet hypertrophy result in mild bilateral neural foraminal stenosis without spinal stenosis.   L5-S1: Mild left eccentric disc bulging, a shallow left paracentral to subarticular disc protrusion, and moderate to severe facet hypertrophy result in mild left lateral recess stenosis and mild left neural foraminal stenosis without spinal stenosis.   IMPRESSION: 1. Multilevel lumbar disc degeneration, greatest at L4-5 where there is grade 1 anterolisthesis and mild bilateral neural foraminal stenosis. 2. Mild left lateral recess and left neural foraminal stenosis at L5-S1. 3. Advanced facet arthrosis at L4-5 and L5-S1 with edema. 4. No spinal stenosis.      PATIENT SURVEYS:  FOTO not tested at eval  COGNITION: Overall cognitive status: Within functional limits for tasks assessed     SENSATION: Hot/Cold: legs "feel cold" Legs go numb frequently  E POSTURE:  guarded posturing; antalgic gait   LOWER EXTREMITY ROM:  Active ROM Right eval Left eval  Hip flexion    Hip extension    Hip abduction    Hip adduction    Hip internal rotation    Hip external rotation    Knee flexion 119 109  Knee extension    Ankle dorsiflexion    Ankle plantarflexion    Ankle inversion    Ankle eversion    Lumbar flexion 60% available at eval    Lumbar extension 10% available at eval     (Blank rows =  not tested)  LOWER EXTREMITY MMT:  MMT Right eval Left eval  Hip flexion 3- 3-  Hip extension    Hip abduction    Hip adduction    Hip internal rotation    Hip external rotation    Knee flexion    Knee extension 3- 3-  Ankle dorsiflexion 4- 4  Ankle plantarflexion    Ankle inversion    Ankle eversion     (Blank rows = not tested)  FUNCTIONAL TESTS:  5 times sit to stand: 32 sec Timed up and go (TUG): 20.5 sec average 2 trials     TODAY'S TREATMENT  DATE:10/14/22 Physical therapy evaluation and treatment  PATIENT EDUCATION:  Education details: Patient educated on exam findings, POC, scope of PT, HEP Person educated: Patient Education method: Explanation, Demonstration, and Handouts Education comprehension: verbalized understanding, returned demonstration, verbal cues required, and tactile cues required  HOME EXERCISE PROGRAM: Access Code: [email or web address] URL: [email or web address] Date: 10/14/2022 Prepared by: [facility]  Exercises - Supine Lower Trunk Rotation  - 2 x daily - 7 x weekly - 1 sets - 10 reps - Seated Heel Toe Raises  - 2 x daily - 7 x weekly - 1 sets - 10 reps - Seated Long Arc Quad  - 2 x daily - 7 x weekly - 1 sets - 10 reps - Seated March  - 2 x daily - 7 x weekly - 1 sets - 10 reps  ASSESSMENT:  CLINICAL IMPRESSION: Patient is a 66 y.o. female who was seen today for physical therapy evaluation and treatment for bilateral knee pain. Patient presents on evaluation with limited lower extremity mobility and strength; limited lumbar mobility; antalgic gait and back and leg pain that all negatively impact her ability to walk, sleep and perform household and community tasks.  Patient will benefit from continued skilled therapy services  to address deficits and promote return to optimal function.       OBJECTIVE IMPAIRMENTS: Abnormal gait, decreased activity tolerance,  decreased balance, decreased endurance, decreased knowledge of condition, decreased mobility, difficulty walking, decreased ROM, decreased strength, hypomobility, increased fascial restrictions, impaired perceived functional ability, increased muscle spasms, impaired flexibility, and pain.   ACTIVITY LIMITATIONS: carrying, lifting, bending, sitting, standing, squatting, sleeping, stairs, transfers, bed mobility, bathing, dressing, reach over head, hygiene/grooming, locomotion level, and caring for others  PARTICIPATION LIMITATIONS: meal prep, cleaning, laundry, driving, shopping, community activity, and occupation  REHAB POTENTIAL: Good  CLINICAL DECISION MAKING: Evolving/moderate complexity  EVALUATION COMPLEXITY: Moderate   GOALS: Goals reviewed with patient? No  SHORT TERM GOALS: Target date: 10/28/2022  Patient will be independent with initial HEP  Baseline: Goal status: INITIAL  2.  Patient will improve 5 times sit to stand score from 32 sec to 28 sec to demonstrate improved functional mobility and increased lower extremity strength.   Baseline: 32 sec Goal status: INITIAL    LONG TERM GOALS: Target date: 11/11/2022    Patient will report at least 50% improvement in overall symptoms and/or function to demonstrate improved functional mobility   Baseline:  Goal status: INITIAL  2.  Patient will improve 5 times sit to stand score from 32 sec to 24 sec to demonstrate improved functional mobility and increased lower extremity strength.   Baseline: 32 sec Goal status: INITIAL  3.  Patient will increase left leg MMTs to 5/5 without pain to promote return to ambulation community distances with minimal deviation.  Baseline: see above Goal status: INITIAL  4.  Patient will improve lumbar extension to 30% with pain levels at 3/10 or less to improve tolerance for reaching activities in kitchen Baseline:  Goal status: INITIAL   PLAN:  PT FREQUENCY: 2x/week  PT DURATION:  4 weeks  PLANNED INTERVENTIONS: Therapeutic exercises, Therapeutic activity, Neuromuscular re-education, Balance training, Gait training, Patient/Family education, Joint manipulation, Joint mobilization, Stair training, Orthotic/Fit training, DME instructions, Aquatic Therapy, Dry Needling, Electrical stimulation, Spinal manipulation, Spinal mobilization, Cryotherapy, Moist heat, Compression bandaging, scar mobilization, Splintting, Taping, Traction, Ultrasound, Ionotophoresis 4mg /ml Dexamethasone, and Manual therapy  PLAN FOR NEXT SESSION: Review HEP and goals; FOTO; lumbar mobility and core strengthening; lower extremity strengthening  4:17 PM, 10/14/22 Ruchi Stoney [place] Sprong MPT [facility] [place] [phone]

## 2022-10-14 ENCOUNTER — Ambulatory Visit (HOSPITAL_COMMUNITY): Payer: Medicare Other | Attending: Obstetrics and Gynecology

## 2022-10-14 DIAGNOSIS — M25561 Pain in right knee: Secondary | ICD-10-CM | POA: Insufficient documentation

## 2022-10-14 DIAGNOSIS — M25562 Pain in left knee: Secondary | ICD-10-CM | POA: Diagnosis not present

## 2022-10-14 DIAGNOSIS — M79605 Pain in left leg: Secondary | ICD-10-CM | POA: Diagnosis present

## 2022-10-14 DIAGNOSIS — M545 Low back pain, unspecified: Secondary | ICD-10-CM | POA: Insufficient documentation

## 2022-10-14 DIAGNOSIS — G8929 Other chronic pain: Secondary | ICD-10-CM | POA: Insufficient documentation

## 2022-10-14 DIAGNOSIS — M79604 Pain in right leg: Secondary | ICD-10-CM | POA: Diagnosis present

## 2022-10-21 ENCOUNTER — Ambulatory Visit (HOSPITAL_COMMUNITY)
Admission: RE | Admit: 2022-10-21 | Discharge: 2022-10-21 | Disposition: A | Discharge status: Home / Self Care | Payer: Medicare Other | Source: Ambulatory Visit | Attending: Family Medicine | Admitting: Family Medicine

## 2022-10-21 DIAGNOSIS — Z1231 Encounter for screening mammogram for malignant neoplasm of breast: Secondary | ICD-10-CM | POA: Insufficient documentation

## 2022-10-26 ENCOUNTER — Ambulatory Visit (HOSPITAL_COMMUNITY): Payer: Medicare Other | Attending: Obstetrics and Gynecology | Admitting: Physical Therapy

## 2022-10-26 ENCOUNTER — Encounter (HOSPITAL_COMMUNITY): Payer: Self-pay | Admitting: Physical Therapy

## 2022-10-26 DIAGNOSIS — M79605 Pain in left leg: Secondary | ICD-10-CM | POA: Insufficient documentation

## 2022-10-26 DIAGNOSIS — M6281 Muscle weakness (generalized): Secondary | ICD-10-CM | POA: Diagnosis present

## 2022-10-26 DIAGNOSIS — R262 Difficulty in walking, not elsewhere classified: Secondary | ICD-10-CM | POA: Insufficient documentation

## 2022-10-26 DIAGNOSIS — M25561 Pain in right knee: Secondary | ICD-10-CM | POA: Insufficient documentation

## 2022-10-26 DIAGNOSIS — M545 Low back pain, unspecified: Secondary | ICD-10-CM | POA: Diagnosis not present

## 2022-10-26 DIAGNOSIS — R293 Abnormal posture: Secondary | ICD-10-CM | POA: Insufficient documentation

## 2022-10-26 DIAGNOSIS — R2689 Other abnormalities of gait and mobility: Secondary | ICD-10-CM | POA: Diagnosis present

## 2022-10-26 DIAGNOSIS — R29898 Other symptoms and signs involving the musculoskeletal system: Secondary | ICD-10-CM | POA: Diagnosis present

## 2022-10-26 DIAGNOSIS — G8929 Other chronic pain: Secondary | ICD-10-CM | POA: Diagnosis present

## 2022-10-26 DIAGNOSIS — M79604 Pain in right leg: Secondary | ICD-10-CM | POA: Diagnosis present

## 2022-10-26 DIAGNOSIS — M25562 Pain in left knee: Secondary | ICD-10-CM | POA: Diagnosis present

## 2022-10-26 NOTE — Therapy (Signed)
OUTPATIENT PHYSICAL THERAPY LOWER EXTREMITY TREATMENT   Patient Name: [name] MRN: [id number] DOB:1955-12-31, 66 y.o., female Today's Date: 10/26/2022   PT End of Session - 10/26/22 0948     Visit Number 2    Number of Visits 8    Date for PT Re-Evaluation 11/11/22    Authorization Type [employer] Medicare    Progress Note Due on Visit 10    PT Start Time 0945    PT Stop Time 1027    PT Time Calculation (min) 42 min    Activity Tolerance Patient tolerated treatment well             Past Medical History:  Diagnosis Date   Anxiety    Asthma    Chronic knee pain    Diverticulitis    Headache    Hypertension    Seasonal allergies    Vertigo    Past Surgical History:  Procedure Laterality Date   ABDOMINAL HYSTERECTOMY     ABLATION COLPOCLESIS     CHOLECYSTECTOMY     COLONOSCOPY WITH PROPOFOL N/A 10/04/2017   Procedure: COLONOSCOPY WITH PROPOFOL;  Surgeon: Rogene Houston, MD;  [facility];  Service: Endoscopy;  Laterality: N/A;  1:15   KNEE ARTHROSCOPY WITH LATERAL MENISECTOMY Right 04/29/2021   Procedure: RIGHT KNEE ARTHROSCOPY WITH LATERAL MENISCECTOMY;  Surgeon: Carole Civil, MD;  Location: [facility];  Service: Orthopedics;  Laterality: Right;   POLYPECTOMY  10/04/2017   Procedure: POLYPECTOMY;  Surgeon: Rogene Houston, MD;  [facility];  Service: Endoscopy;;  sigmoid colon polyp   Patient Active Problem List   Diagnosis Date Noted   s/p right knee arthroscopy lateral meniscectomy 04/29/21 05/06/2021   Old complex tear of lateral meniscus of right knee    Diverticulitis of colon 09/15/2017   Diverticulitis 05/24/2017   AKI (acute kidney injury) ([place]) 05/24/2017   Hypokalemia 05/24/2017   Essential hypertension 05/24/2017   Vertigo 05/24/2017   CAP (community acquired pneumonia) 05/04/2015   Sepsis ([place]) 05/02/2015   Acute respiratory failure ([place]) 05/02/2015    PCP: Lemmie Evens, MD  REFERRING PROVIDER: Lenon Oms  Puechl MD  REFERRING DIAG:  PT eval/tx knee pain per Carlis Abbott, NP   Referral Notes   THERAPY DIAG:  Low back pain, unspecified back pain laterality, unspecified chronicity, unspecified whether sciatica present  Pain in left leg  Chronic pain of right knee  Pain in right leg  Rationale for Evaluation and Treatment Rehabilitation  ONSET DATE: April 2022  SUBJECTIVE:   SUBJECTIVE STATEMENT: Patient states consistent with HEP at home. Improved pain - only in the back, LE feel well.  PERTINENT HISTORY: April 2022 had arthroscopic surgery Right knee Dr. Aline Brochure PAIN:  Are you having pain? Yes: NPRS scale: 3/10 Pain location: back Pain description: sore Aggravating factors: standing Relieving factors: Rest medication  PRECAUTIONS: None  WEIGHT BEARING RESTRICTIONS: No  FALLS:  Has patient fallen in last 6 months? No  LIVING ENVIRONMENT: Lives with: lives with their family Lives in: House/apartment Stairs: Yes: External: 5 steps; on right going up, on left going up, and can reach both Has following equipment at home: Single point cane  OCCUPATION: Retired  PLOF: Independent  PATIENT GOALS: legs to feel better and my back   OBJECTIVE:   DIAGNOSTIC FINDINGS:  CLINICAL DATA:  Low back pain radiating to both legs.   EXAM: MRI LUMBAR SPINE WITHOUT CONTRAST   TECHNIQUE: Multiplanar, multisequence MR imaging of the lumbar spine  was performed. No intravenous contrast was administered.   COMPARISON:  Lumbar spine radiographs 01/06/2022   FINDINGS: Segmentation:  Standard.   Alignment: Facet mediated anterolisthesis of L4 on L5 measuring 3 mm.   Vertebrae: Bilateral facet edema at L4-5 and L5-S1. No fracture or suspicious marrow lesion.   Conus medullaris and cauda equina: Conus extends to the L2 level. Conus and cauda equina appear normal.   Paraspinal and other soft tissues: Unremarkable.   Disc levels:   Disc desiccation throughout the  lumbar spine. Mild disc space narrowing at L2-3, L3-4, and L5-S1 and moderate narrowing at L4-5.   L1-2: Mild facet hypertrophy without disc herniation or stenosis.   L2-3: Mild disc bulging and moderate facet and ligamentum flavum hypertrophy without stenosis.   L3-4: Disc bulging and moderate facet and ligamentum flavum hypertrophy with a small central annular fissure. No significant stenosis.   L4-5: Anterolisthesis with mild bulging of uncovered disc and severe facet hypertrophy result in mild bilateral neural foraminal stenosis without spinal stenosis.   L5-S1: Mild left eccentric disc bulging, a shallow left paracentral to subarticular disc protrusion, and moderate to severe facet hypertrophy result in mild left lateral recess stenosis and mild left neural foraminal stenosis without spinal stenosis.   IMPRESSION: 1. Multilevel lumbar disc degeneration, greatest at L4-5 where there is grade 1 anterolisthesis and mild bilateral neural foraminal stenosis. 2. Mild left lateral recess and left neural foraminal stenosis at L5-S1. 3. Advanced facet arthrosis at L4-5 and L5-S1 with edema. 4. No spinal stenosis.      PATIENT SURVEYS:  FOTO not tested at eval Completed 10/26/22: FOTO score 59, Goals 61  COGNITION: Overall cognitive status: Within functional limits for tasks assessed     SENSATION: Hot/Cold: legs "feel cold" Legs go numb frequently  E POSTURE:  guarded posturing; antalgic gait   LOWER EXTREMITY ROM:  Active ROM Right eval Left eval  Hip flexion    Hip extension    Hip abduction    Hip adduction    Hip internal rotation    Hip external rotation    Knee flexion 119 109  Knee extension    Ankle dorsiflexion    Ankle plantarflexion    Ankle inversion    Ankle eversion    Lumbar flexion 60% available at eval    Lumbar extension 10% available at eval     (Blank rows = not tested)  LOWER EXTREMITY MMT:  MMT Right eval Left eval  Hip flexion  3- 3-  Hip extension    Hip abduction    Hip adduction    Hip internal rotation    Hip external rotation    Knee flexion    Knee extension 3- 3-  Ankle dorsiflexion 4- 4  Ankle plantarflexion    Ankle inversion    Ankle eversion     (Blank rows = not tested)  FUNCTIONAL TESTS:  5 times sit to stand: 32 sec Timed up and go (TUG): 20.5 sec average 2 trials     TODAY'S TREATMENT                                                                           10/26/22 Reviewed HEP and Goals FOTO  Performed with abd bracing techniques: SLR 3x5 Hooklying ball press 2x10 Sidelying clamshell 2x10 Hooklying hip adduction squeeze 2x10  DATE:10/14/22 Physical therapy evaluation and treatment  PATIENT EDUCATION:  Education details: Patient educated on exam findings, POC, scope of PT, HEP Person educated: Patient Education method: Explanation, Demonstration, and Handouts Education comprehension: verbalized understanding, returned demonstration, verbal cues required, and tactile cues required  HOME EXERCISE PROGRAM: Access Code: [email or web address] URL: [email or web address] Date: 10/14/2022 Prepared by: [facility]  Exercises - Supine Lower Trunk Rotation  - 2 x daily - 7 x weekly - 1 sets - 10 reps - Seated Heel Toe Raises  - 2 x daily - 7 x weekly - 1 sets - 10 reps - Seated Long Arc Quad  - 2 x daily - 7 x weekly - 1 sets - 10 reps - Seated March  - 2 x daily - 7 x weekly - 1 sets - 10 reps  ASSESSMENT:  CLINICAL IMPRESSION: Tolerated progressive exercises well. Intermittent cues for core stability to prevent compensation with lumbar spine movements (painful when she does not brace core.) Otherwise reports reduced symptoms in LEs with exercises performed today. Patient will continue to benefit from skilled physical therapy services to further improve independence with functional mobility.     OBJECTIVE IMPAIRMENTS: Abnormal gait, decreased activity tolerance, decreased  balance, decreased endurance, decreased knowledge of condition, decreased mobility, difficulty walking, decreased ROM, decreased strength, hypomobility, increased fascial restrictions, impaired perceived functional ability, increased muscle spasms, impaired flexibility, and pain.   ACTIVITY LIMITATIONS: carrying, lifting, bending, sitting, standing, squatting, sleeping, stairs, transfers, bed mobility, bathing, dressing, reach over head, hygiene/grooming, locomotion level, and caring for others  PARTICIPATION LIMITATIONS: meal prep, cleaning, laundry, driving, shopping, community activity, and occupation  REHAB POTENTIAL: Good  CLINICAL DECISION MAKING: Evolving/moderate complexity  EVALUATION COMPLEXITY: Moderate   GOALS: Goals reviewed with patient? No  SHORT TERM GOALS: Target date: 10/28/2022  Patient will be independent with initial HEP  Baseline: Goal status: INITIAL  2.  Patient will improve 5 times sit to stand score from 32 sec to 28 sec to demonstrate improved functional mobility and increased lower extremity strength.   Baseline: 32 sec Goal status: INITIAL    LONG TERM GOALS: Target date: 11/11/2022    Patient will report at least 50% improvement in overall symptoms and/or function to demonstrate improved functional mobility   Baseline:  Goal status: INITIAL  2.  Patient will improve 5 times sit to stand score from 32 sec to 24 sec to demonstrate improved functional mobility and increased lower extremity strength.   Baseline: 32 sec Goal status: INITIAL  3.  Patient will increase left leg MMTs to 5/5 without pain to promote return to ambulation community distances with minimal deviation.  Baseline: see above Goal status: INITIAL  4.  Patient will improve lumbar extension to 30% with pain levels at 3/10 or less to improve tolerance for reaching activities in kitchen Baseline:  Goal status: INITIAL   PLAN:  PT FREQUENCY: 2x/week  PT DURATION: 4  weeks  PLANNED INTERVENTIONS: Therapeutic exercises, Therapeutic activity, Neuromuscular re-education, Balance training, Gait training, Patient/Family education, Joint manipulation, Joint mobilization, Stair training, Orthotic/Fit training, DME instructions, Aquatic Therapy, Dry Needling, Electrical stimulation, Spinal manipulation, Spinal mobilization, Cryotherapy, Moist heat, Compression bandaging, scar mobilization, Splintting, Taping, Traction, Ultrasound, Ionotophoresis '4mg'$ /ml Dexamethasone, and Manual therapy  PLAN FOR NEXT SESSION: lumbar mobility and core strengthening; lower extremity strengthening   10:32 AM, 10/26/22 Candie Mile, PT, DPT Physical Therapist [facility]  [place]

## 2022-10-28 ENCOUNTER — Ambulatory Visit (HOSPITAL_COMMUNITY): Payer: Medicare Other

## 2022-10-28 DIAGNOSIS — R293 Abnormal posture: Secondary | ICD-10-CM

## 2022-10-28 DIAGNOSIS — M545 Low back pain, unspecified: Secondary | ICD-10-CM

## 2022-10-28 DIAGNOSIS — M79604 Pain in right leg: Secondary | ICD-10-CM

## 2022-10-28 DIAGNOSIS — R29898 Other symptoms and signs involving the musculoskeletal system: Secondary | ICD-10-CM

## 2022-10-28 DIAGNOSIS — R2689 Other abnormalities of gait and mobility: Secondary | ICD-10-CM

## 2022-10-28 DIAGNOSIS — M25562 Pain in left knee: Secondary | ICD-10-CM

## 2022-10-28 DIAGNOSIS — G8929 Other chronic pain: Secondary | ICD-10-CM

## 2022-10-28 DIAGNOSIS — R262 Difficulty in walking, not elsewhere classified: Secondary | ICD-10-CM

## 2022-10-28 DIAGNOSIS — M6281 Muscle weakness (generalized): Secondary | ICD-10-CM

## 2022-10-28 DIAGNOSIS — M79605 Pain in left leg: Secondary | ICD-10-CM

## 2022-10-28 NOTE — Therapy (Signed)
OUTPATIENT PHYSICAL THERAPY LOWER EXTREMITY TREATMENT   Patient Name: [name] MRN: [id number] DOB:1956/03/09, 66 y.o., female Today's Date: 10/28/2022   PT End of Session - 10/28/22 0807     Visit Number 3    Number of Visits 8    Date for PT Re-Evaluation 11/11/22    Authorization Type [employer] Medicare    Progress Note Due on Visit 10    PT Start Time 0807    PT Stop Time 0847    PT Time Calculation (min) 40 min    Activity Tolerance Patient tolerated treatment well             Past Medical History:  Diagnosis Date   Anxiety    Asthma    Chronic knee pain    Diverticulitis    Headache    Hypertension    Seasonal allergies    Vertigo    Past Surgical History:  Procedure Laterality Date   ABDOMINAL HYSTERECTOMY     ABLATION COLPOCLESIS     CHOLECYSTECTOMY     COLONOSCOPY WITH PROPOFOL N/A 10/04/2017   Procedure: COLONOSCOPY WITH PROPOFOL;  Surgeon: Rogene Houston, MD;  [facility];  Service: Endoscopy;  Laterality: N/A;  1:15   KNEE ARTHROSCOPY WITH LATERAL MENISECTOMY Right 04/29/2021   Procedure: RIGHT KNEE ARTHROSCOPY WITH LATERAL MENISCECTOMY;  Surgeon: Carole Civil, MD;  Location: [facility];  Service: Orthopedics;  Laterality: Right;   POLYPECTOMY  10/04/2017   Procedure: POLYPECTOMY;  Surgeon: Rogene Houston, MD;  [facility]: [facility];  Service: Endoscopy;;  sigmoid colon polyp   Patient Active Problem List   Diagnosis Date Noted   s/p right knee arthroscopy lateral meniscectomy 04/29/21 05/06/2021   Old complex tear of lateral meniscus of right knee    Diverticulitis of colon 09/15/2017   Diverticulitis 05/24/2017   AKI (acute kidney injury) ([place]) 05/24/2017   Hypokalemia 05/24/2017   Essential hypertension 05/24/2017   Vertigo 05/24/2017   CAP (community acquired pneumonia) 05/04/2015   Sepsis ([place]) 05/02/2015   Acute respiratory failure ([place]) 05/02/2015    PCP: Lemmie Evens, MD  REFERRING PROVIDER: Lenon Oms  Puechl MD  REFERRING DIAG:  PT eval/tx knee pain per Carlis Abbott, NP   Referral Notes   THERAPY DIAG:  Abnormal posture  Difficulty in walking, not elsewhere classified  Muscle weakness (generalized)  Low back pain, unspecified back pain laterality, unspecified chronicity, unspecified whether sciatica present  Other abnormalities of gait and mobility  Other symptoms and signs involving the musculoskeletal system  Pain in left leg  Pain in right leg  Chronic pain of right knee  Chronic pain of left knee  Rationale for Evaluation and Treatment Rehabilitation  ONSET DATE: April 2022  SUBJECTIVE:   SUBJECTIVE STATEMENT: Pain in the back only; a little more pain this morning; mornings seem to be worse PERTINENT HISTORY: April 2022 had arthroscopic surgery Right knee Dr. Aline Brochure PAIN:  Are you having pain? Yes: NPRS scale: 7/10 Pain location: back Pain description: sore Aggravating factors: standing Relieving factors: Rest medication  PRECAUTIONS: None  WEIGHT BEARING RESTRICTIONS: No  FALLS:  Has patient fallen in last 6 months? No  LIVING ENVIRONMENT: Lives with: lives with their family Lives in: House/apartment Stairs: Yes: External: 5 steps; on right going up, on left going up, and can reach both Has following equipment at home: Single point cane  OCCUPATION: Retired  PLOF: Independent  PATIENT GOALS: legs to feel better and my back   OBJECTIVE:  DIAGNOSTIC FINDINGS:  CLINICAL DATA:  Low back pain radiating to both legs.   EXAM: MRI LUMBAR SPINE WITHOUT CONTRAST   TECHNIQUE: Multiplanar, multisequence MR imaging of the lumbar spine was performed. No intravenous contrast was administered.   COMPARISON:  Lumbar spine radiographs 01/06/2022   FINDINGS: Segmentation:  Standard.   Alignment: Facet mediated anterolisthesis of L4 on L5 measuring 3 mm.   Vertebrae: Bilateral facet edema at L4-5 and L5-S1. No fracture or suspicious  marrow lesion.   Conus medullaris and cauda equina: Conus extends to the L2 level. Conus and cauda equina appear normal.   Paraspinal and other soft tissues: Unremarkable.   Disc levels:   Disc desiccation throughout the lumbar spine. Mild disc space narrowing at L2-3, L3-4, and L5-S1 and moderate narrowing at L4-5.   L1-2: Mild facet hypertrophy without disc herniation or stenosis.   L2-3: Mild disc bulging and moderate facet and ligamentum flavum hypertrophy without stenosis.   L3-4: Disc bulging and moderate facet and ligamentum flavum hypertrophy with a small central annular fissure. No significant stenosis.   L4-5: Anterolisthesis with mild bulging of uncovered disc and severe facet hypertrophy result in mild bilateral neural foraminal stenosis without spinal stenosis.   L5-S1: Mild left eccentric disc bulging, a shallow left paracentral to subarticular disc protrusion, and moderate to severe facet hypertrophy result in mild left lateral recess stenosis and mild left neural foraminal stenosis without spinal stenosis.   IMPRESSION: 1. Multilevel lumbar disc degeneration, greatest at L4-5 where there is grade 1 anterolisthesis and mild bilateral neural foraminal stenosis. 2. Mild left lateral recess and left neural foraminal stenosis at L5-S1. 3. Advanced facet arthrosis at L4-5 and L5-S1 with edema. 4. No spinal stenosis.      PATIENT SURVEYS:  FOTO not tested at eval Completed 10/26/22: FOTO score 59, Goals 61  COGNITION: Overall cognitive status: Within functional limits for tasks assessed     SENSATION: Hot/Cold: legs "feel cold" Legs go numb frequently  E POSTURE:  guarded posturing; antalgic gait   LOWER EXTREMITY ROM:  Active ROM Right eval Left eval  Hip flexion    Hip extension    Hip abduction    Hip adduction    Hip internal rotation    Hip external rotation    Knee flexion 119 109  Knee extension    Ankle dorsiflexion    Ankle  plantarflexion    Ankle inversion    Ankle eversion    Lumbar flexion 60% available at eval    Lumbar extension 10% available at eval     (Blank rows = not tested)  LOWER EXTREMITY MMT:  MMT Right eval Left eval  Hip flexion 3- 3-  Hip extension    Hip abduction    Hip adduction    Hip internal rotation    Hip external rotation    Knee flexion    Knee extension 3- 3-  Ankle dorsiflexion 4- 4  Ankle plantarflexion    Ankle inversion    Ankle eversion     (Blank rows = not tested)  FUNCTIONAL TESTS:  5 times sit to stand: 32 sec Timed up and go (TUG): 20.5 sec average 2 trials     TODAY'S TREATMENT     10/28/22 Supine: LTR x 20 Transverse abdominus contraction 5" x 10 Transverse abdominus contraction with ball squeeze for hip adduction  5" hold x 10 Transverse abdominus contraction with belt for hip abduction 5" x 10  Prone: Prone lying x 1' glute sets  5" hold x 10 Hamstring curls 2 x 10 each Prone on elbows 1'  Sitting: Shoulder retractions 2 x 10 Heel/toe raises 2 x 10 LAQ's  x 10  Standing: Slant board 5 x 20"                                                                      10/26/22 Reviewed HEP and Goals FOTO Performed with abd bracing techniques: SLR 3x5 Hooklying ball press 2x10 Sidelying clamshell 2x10 Hooklying hip adduction squeeze 2x10  DATE:10/14/22 Physical therapy evaluation and treatment  PATIENT EDUCATION:  Education details: Patient educated on exam findings, POC, scope of PT, HEP Person educated: Patient Education method: Consulting civil engineer, Demonstration, and Handouts Education comprehension: verbalized understanding, returned demonstration, verbal cues required, and tactile cues required  HOME EXERCISE PROGRAM: 10/28/22 prone lying, prone on elbows, scapular retractions Access Code: [email or web address] URL: [email or web address] Date: 10/14/2022 Prepared by: [facility]  Exercises - Supine Lower Trunk Rotation  - 2 x daily  - 7 x weekly - 1 sets - 10 reps - Seated Heel Toe Raises  - 2 x daily - 7 x weekly - 1 sets - 10 reps - Seated Long Arc Quad  - 2 x daily - 7 x weekly - 1 sets - 10 reps - Seated March  - 2 x daily - 7 x weekly - 1 sets - 10 reps  ASSESSMENT:  CLINICAL IMPRESSION: Patient with some increased pain in the back starting today's session.  She had some increased pain trying to perform SLR so switched to prone with decreased pain levels. Decreased pain to 1/10 at the end of treatment.  Added scapular retractions and prone lying to HEP today.   Patient will continue to benefit from skilled physical therapy services to further improve independence with functional mobility.     OBJECTIVE IMPAIRMENTS: Abnormal gait, decreased activity tolerance, decreased balance, decreased endurance, decreased knowledge of condition, decreased mobility, difficulty walking, decreased ROM, decreased strength, hypomobility, increased fascial restrictions, impaired perceived functional ability, increased muscle spasms, impaired flexibility, and pain.   ACTIVITY LIMITATIONS: carrying, lifting, bending, sitting, standing, squatting, sleeping, stairs, transfers, bed mobility, bathing, dressing, reach over head, hygiene/grooming, locomotion level, and caring for others  PARTICIPATION LIMITATIONS: meal prep, cleaning, laundry, driving, shopping, community activity, and occupation  REHAB POTENTIAL: Good  CLINICAL DECISION MAKING: Evolving/moderate complexity  EVALUATION COMPLEXITY: Moderate   GOALS: Goals reviewed with patient? No  SHORT TERM GOALS: Target date: 10/28/2022  Patient will be independent with initial HEP  Baseline: Goal status: INITIAL  2.  Patient will improve 5 times sit to stand score from 32 sec to 28 sec to demonstrate improved functional mobility and increased lower extremity strength.   Baseline: 32 sec Goal status: INITIAL    LONG TERM GOALS: Target date: 11/11/2022    Patient will report  at least 50% improvement in overall symptoms and/or function to demonstrate improved functional mobility   Baseline:  Goal status: INITIAL  2.  Patient will improve 5 times sit to stand score from 32 sec to 24 sec to demonstrate improved functional mobility and increased lower extremity strength.   Baseline: 32 sec Goal status: INITIAL  3.  Patient will increase left leg MMTs to 5/5 without pain to  promote return to ambulation community distances with minimal deviation.  Baseline: see above Goal status: INITIAL  4.  Patient will improve lumbar extension to 30% with pain levels at 3/10 or less to improve tolerance for reaching activities in kitchen Baseline:  Goal status: INITIAL   PLAN:  PT FREQUENCY: 2x/week  PT DURATION: 4 weeks  PLANNED INTERVENTIONS: Therapeutic exercises, Therapeutic activity, Neuromuscular re-education, Balance training, Gait training, Patient/Family education, Joint manipulation, Joint mobilization, Stair training, Orthotic/Fit training, DME instructions, Aquatic Therapy, Dry Needling, Electrical stimulation, Spinal manipulation, Spinal mobilization, Cryotherapy, Moist heat, Compression bandaging, scar mobilization, Splintting, Taping, Traction, Ultrasound, Ionotophoresis '4mg'$ /ml Dexamethasone, and Manual therapy  PLAN FOR NEXT SESSION: lumbar mobility and core strengthening; lower extremity strengthening   9:06 AM, 10/28/22 Trasean Delima Small Nerine Pulse MPT [facility] [place] [phone]

## 2022-10-30 ENCOUNTER — Other Ambulatory Visit: Payer: Self-pay | Admitting: Nurse Practitioner

## 2022-10-30 DIAGNOSIS — M545 Low back pain, unspecified: Secondary | ICD-10-CM

## 2022-11-02 ENCOUNTER — Ambulatory Visit (HOSPITAL_COMMUNITY): Payer: Medicare Other | Admitting: Physical Therapy

## 2022-11-02 ENCOUNTER — Encounter (HOSPITAL_COMMUNITY): Payer: Self-pay | Admitting: Physical Therapy

## 2022-11-02 DIAGNOSIS — M545 Low back pain, unspecified: Secondary | ICD-10-CM | POA: Diagnosis not present

## 2022-11-02 DIAGNOSIS — M79604 Pain in right leg: Secondary | ICD-10-CM

## 2022-11-02 DIAGNOSIS — G8929 Other chronic pain: Secondary | ICD-10-CM

## 2022-11-02 NOTE — Therapy (Signed)
OUTPATIENT PHYSICAL THERAPY LOWER EXTREMITY TREATMENT   Patient Name: CHABLIS LOSH MRN: [id number] DOB:05/02/1956, 66 y.o., female Today's Date: 11/02/2022   PT End of Session - 11/02/22 0948     Visit Number 4    Number of Visits 8    Date for PT Re-Evaluation 11/11/22    Authorization Type [employer] Medicare    Progress Note Due on Visit 10    PT Start Time [phone]    PT Stop Time 1030    PT Time Calculation (min) 44 min    Activity Tolerance Patient tolerated treatment well    Behavior During Therapy WFL for tasks assessed/performed             Past Medical History:  Diagnosis Date   Anxiety    Asthma    Chronic knee pain    Diverticulitis    Headache    Hypertension    Seasonal allergies    Vertigo    Past Surgical History:  Procedure Laterality Date   ABDOMINAL HYSTERECTOMY     ABLATION COLPOCLESIS     CHOLECYSTECTOMY     COLONOSCOPY WITH PROPOFOL N/A 10/04/2017   Procedure: COLONOSCOPY WITH PROPOFOL;  Surgeon: Rogene Houston, MD;  [facility];  Service: Endoscopy;  Laterality: N/A;  1:15   KNEE ARTHROSCOPY WITH LATERAL MENISECTOMY Right 04/29/2021   Procedure: RIGHT KNEE ARTHROSCOPY WITH LATERAL MENISCECTOMY;  Surgeon: Carole Civil, MD;  Location: [facility];  Service: Orthopedics;  Laterality: Right;   POLYPECTOMY  10/04/2017   Procedure: POLYPECTOMY;  Surgeon: Rogene Houston, MD;  [facility];  Service: Endoscopy;;  sigmoid colon polyp   Patient Active Problem List   Diagnosis Date Noted   s/p right knee arthroscopy lateral meniscectomy 04/29/21 05/06/2021   Old complex tear of lateral meniscus of right knee    Diverticulitis of colon 09/15/2017   Diverticulitis 05/24/2017   AKI (acute kidney injury) ([place]) 05/24/2017   Hypokalemia 05/24/2017   Essential hypertension 05/24/2017   Vertigo 05/24/2017   CAP (community acquired pneumonia) 05/04/2015   Sepsis ([place]) 05/02/2015   Acute respiratory failure ([place]) 05/02/2015    PCP:  Lemmie Evens, MD  REFERRING PROVIDER: Lenon Oms Puechl MD  REFERRING DIAG:  PT eval/tx knee pain per Carlis Abbott, NP   Referral Notes   THERAPY DIAG:  Low back pain, unspecified back pain laterality, unspecified chronicity, unspecified whether sciatica present  Pain in right leg  Chronic pain of right knee  Chronic pain of left knee  Rationale for Evaluation and Treatment Rehabilitation  ONSET DATE: April 2022  SUBJECTIVE:   SUBJECTIVE STATEMENT: Reports some soreness in Rt knee. Has questions about HEP. PERTINENT HISTORY: April 2022 had arthroscopic surgery Right knee Dr. Aline Brochure PAIN:  Are you having pain? Yes: NPRS scale: 5/10 Pain location: back Pain description: sore Aggravating factors: standing Relieving factors: Rest medication  PRECAUTIONS: None  WEIGHT BEARING RESTRICTIONS: No  FALLS:  Has patient fallen in last 6 months? No  LIVING ENVIRONMENT: Lives with: lives with their family Lives in: House/apartment Stairs: Yes: External: 5 steps; on right going up, on left going up, and can reach both Has following equipment at home: Single point cane  OCCUPATION: Retired  PLOF: Independent  PATIENT GOALS: legs to feel better and my back   OBJECTIVE:   DIAGNOSTIC FINDINGS:  CLINICAL DATA:  Low back pain radiating to both legs.   EXAM: MRI LUMBAR SPINE WITHOUT CONTRAST   TECHNIQUE: Multiplanar, multisequence MR imaging of  the lumbar spine was performed. No intravenous contrast was administered.   COMPARISON:  Lumbar spine radiographs 01/06/2022   FINDINGS: Segmentation:  Standard.   Alignment: Facet mediated anterolisthesis of L4 on L5 measuring 3 mm.   Vertebrae: Bilateral facet edema at L4-5 and L5-S1. No fracture or suspicious marrow lesion.   Conus medullaris and cauda equina: Conus extends to the L2 level. Conus and cauda equina appear normal.   Paraspinal and other soft tissues: Unremarkable.   Disc levels:    Disc desiccation throughout the lumbar spine. Mild disc space narrowing at L2-3, L3-4, and L5-S1 and moderate narrowing at L4-5.   L1-2: Mild facet hypertrophy without disc herniation or stenosis.   L2-3: Mild disc bulging and moderate facet and ligamentum flavum hypertrophy without stenosis.   L3-4: Disc bulging and moderate facet and ligamentum flavum hypertrophy with a small central annular fissure. No significant stenosis.   L4-5: Anterolisthesis with mild bulging of uncovered disc and severe facet hypertrophy result in mild bilateral neural foraminal stenosis without spinal stenosis.   L5-S1: Mild left eccentric disc bulging, a shallow left paracentral to subarticular disc protrusion, and moderate to severe facet hypertrophy result in mild left lateral recess stenosis and mild left neural foraminal stenosis without spinal stenosis.   IMPRESSION: 1. Multilevel lumbar disc degeneration, greatest at L4-5 where there is grade 1 anterolisthesis and mild bilateral neural foraminal stenosis. 2. Mild left lateral recess and left neural foraminal stenosis at L5-S1. 3. Advanced facet arthrosis at L4-5 and L5-S1 with edema. 4. No spinal stenosis.      PATIENT SURVEYS:  FOTO not tested at eval Completed 10/26/22: FOTO score 59, Goals 61  COGNITION: Overall cognitive status: Within functional limits for tasks assessed     SENSATION: Hot/Cold: legs "feel cold" Legs go numb frequently  E POSTURE:  guarded posturing; antalgic gait   LOWER EXTREMITY ROM:  Active ROM Right eval Left eval  Hip flexion    Hip extension    Hip abduction    Hip adduction    Hip internal rotation    Hip external rotation    Knee flexion 119 109  Knee extension    Ankle dorsiflexion    Ankle plantarflexion    Ankle inversion    Ankle eversion    Lumbar flexion 60% available at eval    Lumbar extension 10% available at eval     (Blank rows = not tested)  LOWER EXTREMITY MMT:  MMT  Right eval Left eval  Hip flexion 3- 3-  Hip extension    Hip abduction    Hip adduction    Hip internal rotation    Hip external rotation    Knee flexion    Knee extension 3- 3-  Ankle dorsiflexion 4- 4  Ankle plantarflexion    Ankle inversion    Ankle eversion     (Blank rows = not tested)  FUNCTIONAL TESTS:  5 times sit to stand: 32 sec Timed up and go (TUG): 20.5 sec average 2 trials     TODAY'S TREATMENT     11/02/22 Supine: LTR x 10 Transverse abdominus contraction 5" x 10 Transverse abdominus contraction with ball squeeze for hip adduction  5" hold x 10 Transverse abdominus contraction with belt for hip abduction 5" x 10 Bridge 2x10 SLR 2x10  Prone: glute sets 5" hold x 10 Hamstring curls 2 x 10 each  Sitting: Shoulder retractions 2 x 10 Heel/toe raises 2 x 10 LAQ's  2 x 10  Standing:  Slant board 3 x 30"   10/28/22 Supine: LTR x 20 Transverse abdominus contraction 5" x 10 Transverse abdominus contraction with ball squeeze for hip adduction  5" hold x 10 Transverse abdominus contraction with belt for hip abduction 5" x 10  Prone: Prone lying x 1' glute sets 5" hold x 10 Hamstring curls 2 x 10 each Prone on elbows 1'  Sitting: Shoulder retractions 2 x 10 Heel/toe raises 2 x 10 LAQ's  x 10  Standing: Slant board 5 x 20"                                                                      10/26/22 Reviewed HEP and Goals FOTO Performed with abd bracing techniques: SLR 3x5 Hooklying ball press 2x10 Sidelying clamshell 2x10 Hooklying hip adduction squeeze 2x10  DATE:10/14/22 Physical therapy evaluation and treatment  PATIENT EDUCATION:  Education details: Patient educated on exam findings, POC, scope of PT, HEP Person educated: Patient Education method: Consulting civil engineer, Demonstration, and Handouts Education comprehension: verbalized understanding, returned demonstration, verbal cues required, and tactile cues required  HOME EXERCISE  PROGRAM: 10/28/22 prone lying, prone on elbows, scapular retractions Access Code: [email or web address] URL: [email or web address] Date: 10/14/2022 Prepared by: [facility]  Exercises - Supine Lower Trunk Rotation  - 2 x daily - 7 x weekly - 1 sets - 10 reps - Seated Heel Toe Raises  - 2 x daily - 7 x weekly - 1 sets - 10 reps - Seated Long Arc Quad  - 2 x daily - 7 x weekly - 1 sets - 10 reps - Seated March  - 2 x daily - 7 x weekly - 1 sets - 10 reps  ASSESSMENT:  CLINICAL IMPRESSION: Progressed exercises as tolerated, increasing volume and challenge with core stability requirements. Central discomfort of low back (no radicular symptoms) when prone, limited time in this position. Required intermittent verbal cues for technique throughout session today. Reviewed questions about HEP. Patient will continue to benefit from skilled physical therapy services to further improve independence with functional mobility.      OBJECTIVE IMPAIRMENTS: Abnormal gait, decreased activity tolerance, decreased balance, decreased endurance, decreased knowledge of condition, decreased mobility, difficulty walking, decreased ROM, decreased strength, hypomobility, increased fascial restrictions, impaired perceived functional ability, increased muscle spasms, impaired flexibility, and pain.   ACTIVITY LIMITATIONS: carrying, lifting, bending, sitting, standing, squatting, sleeping, stairs, transfers, bed mobility, bathing, dressing, reach over head, hygiene/grooming, locomotion level, and caring for others  PARTICIPATION LIMITATIONS: meal prep, cleaning, laundry, driving, shopping, community activity, and occupation  REHAB POTENTIAL: Good  CLINICAL DECISION MAKING: Evolving/moderate complexity  EVALUATION COMPLEXITY: Moderate   GOALS: Goals reviewed with patient? No  SHORT TERM GOALS: Target date: 10/28/2022  Patient will be independent with initial HEP  Baseline: Goal status: INITIAL  2.  Patient  will improve 5 times sit to stand score from 32 sec to 28 sec to demonstrate improved functional mobility and increased lower extremity strength.   Baseline: 32 sec Goal status: INITIAL    LONG TERM GOALS: Target date: 11/11/2022    Patient will report at least 50% improvement in overall symptoms and/or function to demonstrate improved functional mobility   Baseline:  Goal status: INITIAL  2.  Patient will improve 5 times  sit to stand score from 32 sec to 24 sec to demonstrate improved functional mobility and increased lower extremity strength.   Baseline: 32 sec Goal status: INITIAL  3.  Patient will increase left leg MMTs to 5/5 without pain to promote return to ambulation community distances with minimal deviation.  Baseline: see above Goal status: INITIAL  4.  Patient will improve lumbar extension to 30% with pain levels at 3/10 or less to improve tolerance for reaching activities in kitchen Baseline:  Goal status: INITIAL   PLAN:  PT FREQUENCY: 2x/week  PT DURATION: 4 weeks  PLANNED INTERVENTIONS: Therapeutic exercises, Therapeutic activity, Neuromuscular re-education, Balance training, Gait training, Patient/Family education, Joint manipulation, Joint mobilization, Stair training, Orthotic/Fit training, DME instructions, Aquatic Therapy, Dry Needling, Electrical stimulation, Spinal manipulation, Spinal mobilization, Cryotherapy, Moist heat, Compression bandaging, scar mobilization, Splintting, Taping, Traction, Ultrasound, Ionotophoresis '4mg'$ /ml Dexamethasone, and Manual therapy  PLAN FOR NEXT SESSION: lumbar mobility and core strengthening; lower extremity strengthening   10:34 AM, 11/02/22 Candie Mile, PT, DPT Physical Therapist [facility] [place]

## 2022-11-05 ENCOUNTER — Ambulatory Visit (HOSPITAL_COMMUNITY): Payer: Medicare Other | Admitting: Physical Therapy

## 2022-11-05 ENCOUNTER — Encounter (HOSPITAL_COMMUNITY): Payer: Self-pay | Admitting: Physical Therapy

## 2022-11-05 DIAGNOSIS — M545 Low back pain, unspecified: Secondary | ICD-10-CM

## 2022-11-05 DIAGNOSIS — M79604 Pain in right leg: Secondary | ICD-10-CM

## 2022-11-05 DIAGNOSIS — G8929 Other chronic pain: Secondary | ICD-10-CM

## 2022-11-05 NOTE — Therapy (Signed)
OUTPATIENT PHYSICAL THERAPY LOWER EXTREMITY TREATMENT   Patient Name: [name] MRN: [id number] DOB:03/06/1956, 66 y.o., female Today's Date: 11/05/2022   PT End of Session - 11/05/22 0733     Visit Number 5    Number of Visits 8    Date for PT Re-Evaluation 11/11/22    Authorization Type [employer]    Progress Note Due on Visit 10    PT Start Time 0732    PT Stop Time 0810    PT Time Calculation (min) 38 min    Activity Tolerance Patient tolerated treatment well    Behavior During Therapy WFL for tasks assessed/performed             Past Medical History:  Diagnosis Date   Anxiety    Asthma    Chronic knee pain    Diverticulitis    Headache    Hypertension    Seasonal allergies    Vertigo    Past Surgical History:  Procedure Laterality Date   ABDOMINAL HYSTERECTOMY     ABLATION COLPOCLESIS     CHOLECYSTECTOMY     COLONOSCOPY WITH PROPOFOL N/A 10/04/2017   Procedure: COLONOSCOPY WITH PROPOFOL;  Surgeon: Rogene Houston, MD;  Location: [facility];  Service: Endoscopy;  Laterality: N/A;  1:15   KNEE ARTHROSCOPY WITH LATERAL MENISECTOMY Right 04/29/2021   Procedure: RIGHT KNEE ARTHROSCOPY WITH LATERAL MENISCECTOMY;  Surgeon: Carole Civil, MD;  Location: [facility];  Service: Orthopedics;  Laterality: Right;   POLYPECTOMY  10/04/2017   Procedure: POLYPECTOMY;  Surgeon: Rogene Houston, MD;  Location: [facility];  Service: Endoscopy;;  sigmoid colon polyp   Patient Active Problem List   Diagnosis Date Noted   s/p right knee arthroscopy lateral meniscectomy 04/29/21 05/06/2021   Old complex tear of lateral meniscus of right knee    Diverticulitis of colon 09/15/2017   Diverticulitis 05/24/2017   AKI (acute kidney injury) ([place]) 05/24/2017   Hypokalemia 05/24/2017   Essential hypertension 05/24/2017   Vertigo 05/24/2017   CAP (community acquired pneumonia) 05/04/2015   Sepsis ([place]) 05/02/2015   Acute respiratory failure ([place]) 05/02/2015    PCP:  Lemmie Evens, MD  REFERRING PROVIDER: Lenon Oms Puechl MD  REFERRING DIAG:  PT eval/tx knee pain per Carlis Abbott, NP   Referral Notes   THERAPY DIAG:  Low back pain, unspecified back pain laterality, unspecified chronicity, unspecified whether sciatica present  Pain in right leg  Chronic pain of right knee  Chronic pain of left knee  Rationale for Evaluation and Treatment Rehabilitation  ONSET DATE: April 2022  SUBJECTIVE:   SUBJECTIVE STATEMENT: Patient states "she's here". Back is better. Something is not right with the knees still.   PERTINENT HISTORY: April 2022 had arthroscopic surgery Right knee Dr. Aline Brochure PAIN:  Are you having pain? Yes: NPRS scale: 7/10 Pain location: back Pain description: sore Aggravating factors: standing Relieving factors: Rest medication  PRECAUTIONS: None  WEIGHT BEARING RESTRICTIONS: No  FALLS:  Has patient fallen in last 6 months? No  LIVING ENVIRONMENT: Lives with: lives with their family Lives in: House/apartment Stairs: Yes: External: 5 steps; on right going up, on left going up, and can reach both Has following equipment at home: Single point cane  OCCUPATION: Retired  PLOF: Independent  PATIENT GOALS: legs to feel better and my back   OBJECTIVE:   DIAGNOSTIC FINDINGS:  CLINICAL DATA:  Low back pain radiating to both legs.   EXAM: MRI LUMBAR SPINE WITHOUT CONTRAST  TECHNIQUE: Multiplanar, multisequence MR imaging of the lumbar spine was performed. No intravenous contrast was administered.   COMPARISON:  Lumbar spine radiographs 01/06/2022   FINDINGS: Segmentation:  Standard.   Alignment: Facet mediated anterolisthesis of L4 on L5 measuring 3 mm.   Vertebrae: Bilateral facet edema at L4-5 and L5-S1. No fracture or suspicious marrow lesion.   Conus medullaris and cauda equina: Conus extends to the L2 level. Conus and cauda equina appear normal.   Paraspinal and other soft tissues:  Unremarkable.   Disc levels:   Disc desiccation throughout the lumbar spine. Mild disc space narrowing at L2-3, L3-4, and L5-S1 and moderate narrowing at L4-5.   L1-2: Mild facet hypertrophy without disc herniation or stenosis.   L2-3: Mild disc bulging and moderate facet and ligamentum flavum hypertrophy without stenosis.   L3-4: Disc bulging and moderate facet and ligamentum flavum hypertrophy with a small central annular fissure. No significant stenosis.   L4-5: Anterolisthesis with mild bulging of uncovered disc and severe facet hypertrophy result in mild bilateral neural foraminal stenosis without spinal stenosis.   L5-S1: Mild left eccentric disc bulging, a shallow left paracentral to subarticular disc protrusion, and moderate to severe facet hypertrophy result in mild left lateral recess stenosis and mild left neural foraminal stenosis without spinal stenosis.   IMPRESSION: 1. Multilevel lumbar disc degeneration, greatest at L4-5 where there is grade 1 anterolisthesis and mild bilateral neural foraminal stenosis. 2. Mild left lateral recess and left neural foraminal stenosis at L5-S1. 3. Advanced facet arthrosis at L4-5 and L5-S1 with edema. 4. No spinal stenosis.      PATIENT SURVEYS:  FOTO not tested at eval Completed 10/26/22: FOTO score 59, Goals 61  COGNITION: Overall cognitive status: Within functional limits for tasks assessed     SENSATION: Hot/Cold: legs "feel cold" Legs go numb frequently  E POSTURE:  guarded posturing; antalgic gait   LOWER EXTREMITY ROM:  Active ROM Right eval Left eval  Hip flexion    Hip extension    Hip abduction    Hip adduction    Hip internal rotation    Hip external rotation    Knee flexion 119 109  Knee extension    Ankle dorsiflexion    Ankle plantarflexion    Ankle inversion    Ankle eversion    Lumbar flexion 60% available at eval    Lumbar extension 10% available at eval     (Blank rows = not  tested)  LOWER EXTREMITY MMT:  MMT Right eval Left eval  Hip flexion 3- 3-  Hip extension    Hip abduction    Hip adduction    Hip internal rotation    Hip external rotation    Knee flexion    Knee extension 3- 3-  Ankle dorsiflexion 4- 4  Ankle plantarflexion    Ankle inversion    Ankle eversion     (Blank rows = not tested)  FUNCTIONAL TESTS:  5 times sit to stand: 32 sec Timed up and go (TUG): 20.5 sec average 2 trials     TODAY'S TREATMENT     11/05/22 LTR x 20  Bridge x 20  SLR 2 x 10 bilateral  Prone hamstring curls 2 x 10 bilateral  Prone hip extension 2 x 10 bilateral  LAQ 10 x 5 second holds bilateral  Standing HR/TR 2x 10 each Standing hip abduction 2 x 10 bilateral STS 1 x 10   11/02/22 Supine: LTR x 10 Transverse abdominus contraction 5" x  10 Transverse abdominus contraction with ball squeeze for hip adduction  5" hold x 10 Transverse abdominus contraction with belt for hip abduction 5" x 10 Bridge 2x10 SLR 2x10  Prone: glute sets 5" hold x 10 Hamstring curls 2 x 10 each  Sitting: Shoulder retractions 2 x 10 Heel/toe raises 2 x 10 LAQ's  2 x 10  Standing: Slant board 3 x 30"   10/28/22 Supine: LTR x 20 Transverse abdominus contraction 5" x 10 Transverse abdominus contraction with ball squeeze for hip adduction  5" hold x 10 Transverse abdominus contraction with belt for hip abduction 5" x 10  Prone: Prone lying x 1' glute sets 5" hold x 10 Hamstring curls 2 x 10 each Prone on elbows 1'  Sitting: Shoulder retractions 2 x 10 Heel/toe raises 2 x 10 LAQ's  x 10  Standing: Slant board 5 x 20"                                                                      10/26/22 Reviewed HEP and Goals FOTO Performed with abd bracing techniques: SLR 3x5 Hooklying ball press 2x10 Sidelying clamshell 2x10 Hooklying hip adduction squeeze 2x10    PATIENT EDUCATION:  Education details: 11/05/22: HEP, exercise mechanics, rationale of  exercises' EVAL:Patient educated on exam findings, POC, scope of PT, HEP Person educated: Patient Education method: Explanation, Demonstration, and Handouts Education comprehension: verbalized understanding, returned demonstration, verbal cues required, and tactile cues required  HOME EXERCISE PROGRAM: Access Code: [email or web address]  11/05/22- Prone Hip Extension  - 1 x daily - 7 x weekly - 2 sets - 10 reps - Standing Heel Raises  - 1 x daily - 7 x weekly - 2 sets - 10 reps - Toe Raises with Counter Support  - 1 x daily - 7 x weekly - 2 sets - 10 reps - Sit to Stand  - 1 x daily - 7 x weekly - 2 sets - 10 reps  10/28/22 prone lying, prone on elbows, scapular retractions Date: 10/14/2022 Prepared by: [facility]  Exercises - Supine Lower Trunk Rotation  - 2 x daily - 7 x weekly - 1 sets - 10 reps - Seated Heel Toe Raises  - 2 x daily - 7 x weekly - 1 sets - 10 reps - Seated Long Arc Quad  - 2 x daily - 7 x weekly - 1 sets - 10 reps - Seated March  - 2 x daily - 7 x weekly - 1 sets - 10 reps  ASSESSMENT:  CLINICAL IMPRESSION: Began session with table exercises and added hip extension for improving gluteal strength. Patient notes fatigue but able to complete with good mechanics. Completes standing exercises with good mechanics following initial cueing and demonstration. Patient will continue to benefit from physical therapy in order to improve function and reduce impairment.       OBJECTIVE IMPAIRMENTS: Abnormal gait, decreased activity tolerance, decreased balance, decreased endurance, decreased knowledge of condition, decreased mobility, difficulty walking, decreased ROM, decreased strength, hypomobility, increased fascial restrictions, impaired perceived functional ability, increased muscle spasms, impaired flexibility, and pain.   ACTIVITY LIMITATIONS: carrying, lifting, bending, sitting, standing, squatting, sleeping, stairs, transfers, bed mobility, bathing, dressing, reach over head,  hygiene/grooming, locomotion level, and caring  for others  PARTICIPATION LIMITATIONS: meal prep, cleaning, laundry, driving, shopping, community activity, and occupation  REHAB POTENTIAL: Good  CLINICAL DECISION MAKING: Evolving/moderate complexity  EVALUATION COMPLEXITY: Moderate   GOALS: Goals reviewed with patient? No  SHORT TERM GOALS: Target date: 10/28/2022  Patient will be independent with initial HEP  Baseline: Goal status: INITIAL  2.  Patient will improve 5 times sit to stand score from 32 sec to 28 sec to demonstrate improved functional mobility and increased lower extremity strength.   Baseline: 32 sec Goal status: INITIAL    LONG TERM GOALS: Target date: 11/11/2022    Patient will report at least 50% improvement in overall symptoms and/or function to demonstrate improved functional mobility   Baseline:  Goal status: INITIAL  2.  Patient will improve 5 times sit to stand score from 32 sec to 24 sec to demonstrate improved functional mobility and increased lower extremity strength.   Baseline: 32 sec Goal status: INITIAL  3.  Patient will increase left leg MMTs to 5/5 without pain to promote return to ambulation community distances with minimal deviation.  Baseline: see above Goal status: INITIAL  4.  Patient will improve lumbar extension to 30% with pain levels at 3/10 or less to improve tolerance for reaching activities in kitchen Baseline:  Goal status: INITIAL   PLAN:  PT FREQUENCY: 2x/week  PT DURATION: 4 weeks  PLANNED INTERVENTIONS: Therapeutic exercises, Therapeutic activity, Neuromuscular re-education, Balance training, Gait training, Patient/Family education, Joint manipulation, Joint mobilization, Stair training, Orthotic/Fit training, DME instructions, Aquatic Therapy, Dry Needling, Electrical stimulation, Spinal manipulation, Spinal mobilization, Cryotherapy, Moist heat, Compression bandaging, scar mobilization, Splintting, Taping,  Traction, Ultrasound, Ionotophoresis '4mg'$ /ml Dexamethasone, and Manual therapy  PLAN FOR NEXT SESSION: lumbar mobility and core strengthening; lower extremity strengthening   7:33 AM, 11/05/22 Mearl Latin PT, DPT Physical Therapist at [facility]

## 2022-11-09 ENCOUNTER — Encounter (HOSPITAL_COMMUNITY): Payer: Medicare Other | Admitting: Physical Therapy

## 2022-11-11 ENCOUNTER — Ambulatory Visit (HOSPITAL_COMMUNITY): Payer: Medicare Other

## 2022-11-11 DIAGNOSIS — G8929 Other chronic pain: Secondary | ICD-10-CM

## 2022-11-11 DIAGNOSIS — R262 Difficulty in walking, not elsewhere classified: Secondary | ICD-10-CM

## 2022-11-11 DIAGNOSIS — M545 Low back pain, unspecified: Secondary | ICD-10-CM | POA: Diagnosis not present

## 2022-11-11 DIAGNOSIS — M25562 Pain in left knee: Secondary | ICD-10-CM

## 2022-11-11 DIAGNOSIS — M79604 Pain in right leg: Secondary | ICD-10-CM

## 2022-11-11 NOTE — Therapy (Signed)
OUTPATIENT PHYSICAL THERAPY LOWER EXTREMITY TREATMENT PHYSICAL THERAPY DISCHARGE SUMMARY  Visits from Start of Care: 6  Current functional level related to goals / functional outcomes: See below   Remaining deficits: See below   Education / Equipment: See below   Patient agrees to discharge. Patient goals were partially met. Patient is being discharged due to the patient's request.  She has made good functional progress with PT but continues with high pain levels. Request discharge at this time to independent HEP    Patient Name: [name] MRN: [id number] DOB:25-Apr-1956, 66 y.o., female Today's Date: 11/11/2022   PT End of Session - 11/11/22 0817     Visit Number 6    Number of Visits 8    Date for PT Re-Evaluation 11/11/22    Authorization Type [employer] Medicare    Progress Note Due on Visit 10    PT Start Time 0815    PT Stop Time 0855    PT Time Calculation (min) 40 min    Activity Tolerance Patient tolerated treatment well    Behavior During Therapy WFL for tasks assessed/performed             Past Medical History:  Diagnosis Date   Anxiety    Asthma    Chronic knee pain    Diverticulitis    Headache    Hypertension    Seasonal allergies    Vertigo    Past Surgical History:  Procedure Laterality Date   ABDOMINAL HYSTERECTOMY     ABLATION COLPOCLESIS     CHOLECYSTECTOMY     COLONOSCOPY WITH PROPOFOL N/A 10/04/2017   Procedure: COLONOSCOPY WITH PROPOFOL;  Surgeon: Rogene Houston, MD;  Location: [facility];  Service: Endoscopy;  Laterality: N/A;  1:15   KNEE ARTHROSCOPY WITH LATERAL MENISECTOMY Right 04/29/2021   Procedure: RIGHT KNEE ARTHROSCOPY WITH LATERAL MENISCECTOMY;  Surgeon: Carole Civil, MD;  Location: [facility];  Service: Orthopedics;  Laterality: Right;   POLYPECTOMY  10/04/2017   Procedure: POLYPECTOMY;  Surgeon: Rogene Houston, MD;  Location: [facility];  Service: Endoscopy;;  sigmoid colon polyp   Patient Active Problem List    Diagnosis Date Noted   s/p right knee arthroscopy lateral meniscectomy 04/29/21 05/06/2021   Old complex tear of lateral meniscus of right knee    Diverticulitis of colon 09/15/2017   Diverticulitis 05/24/2017   AKI (acute kidney injury) ([place]) 05/24/2017   Hypokalemia 05/24/2017   Essential hypertension 05/24/2017   Vertigo 05/24/2017   CAP (community acquired pneumonia) 05/04/2015   Sepsis ([place]) 05/02/2015   Acute respiratory failure ([place]) 05/02/2015    PCP: Lemmie Evens, MD  REFERRING PROVIDER: Lenon Oms Puechl MD  REFERRING DIAG:  PT eval/tx knee pain per Carlis Abbott, NP   Referral Notes   THERAPY DIAG:  Low back pain, unspecified back pain laterality, unspecified chronicity, unspecified whether sciatica present  Pain in right leg  Chronic pain of right knee  Chronic pain of left knee  Difficulty in walking, not elsewhere classified  Rationale for Evaluation and Treatment Rehabilitation  ONSET DATE: April 2022  SUBJECTIVE:   SUBJECTIVE STATEMENT: Patient reports still having knee and leg pain bilaterally.  Feels she is a little better overall but her legs and back still hurt quite a bit. Minimal percentage of improvement as far as pain.  Compliant with HEP.  PERTINENT HISTORY: April 2022 had arthroscopic surgery Right knee Dr. Aline Brochure PAIN:  Are you having pain? Yes: NPRS scale: 8/10 Pain location:  back Pain description: sore Aggravating factors: standing Relieving factors: Rest medication  PRECAUTIONS: None  WEIGHT BEARING RESTRICTIONS: No  FALLS:  Has patient fallen in last 6 months? No  LIVING ENVIRONMENT: Lives with: lives with their family Lives in: House/apartment Stairs: Yes: External: 5 steps; on right going up, on left going up, and can reach both Has following equipment at home: Single point cane  OCCUPATION: Retired  PLOF: Independent  PATIENT GOALS: legs to feel better and my back   OBJECTIVE:   DIAGNOSTIC  FINDINGS:  CLINICAL DATA:  Low back pain radiating to both legs.   EXAM: MRI LUMBAR SPINE WITHOUT CONTRAST   TECHNIQUE: Multiplanar, multisequence MR imaging of the lumbar spine was performed. No intravenous contrast was administered.   COMPARISON:  Lumbar spine radiographs 01/06/2022   FINDINGS: Segmentation:  Standard.   Alignment: Facet mediated anterolisthesis of L4 on L5 measuring 3 mm.   Vertebrae: Bilateral facet edema at L4-5 and L5-S1. No fracture or suspicious marrow lesion.   Conus medullaris and cauda equina: Conus extends to the L2 level. Conus and cauda equina appear normal.   Paraspinal and other soft tissues: Unremarkable.   Disc levels:   Disc desiccation throughout the lumbar spine. Mild disc space narrowing at L2-3, L3-4, and L5-S1 and moderate narrowing at L4-5.   L1-2: Mild facet hypertrophy without disc herniation or stenosis.   L2-3: Mild disc bulging and moderate facet and ligamentum flavum hypertrophy without stenosis.   L3-4: Disc bulging and moderate facet and ligamentum flavum hypertrophy with a small central annular fissure. No significant stenosis.   L4-5: Anterolisthesis with mild bulging of uncovered disc and severe facet hypertrophy result in mild bilateral neural foraminal stenosis without spinal stenosis.   L5-S1: Mild left eccentric disc bulging, a shallow left paracentral to subarticular disc protrusion, and moderate to severe facet hypertrophy result in mild left lateral recess stenosis and mild left neural foraminal stenosis without spinal stenosis.   IMPRESSION: 1. Multilevel lumbar disc degeneration, greatest at L4-5 where there is grade 1 anterolisthesis and mild bilateral neural foraminal stenosis. 2. Mild left lateral recess and left neural foraminal stenosis at L5-S1. 3. Advanced facet arthrosis at L4-5 and L5-S1 with edema. 4. No spinal stenosis.      PATIENT SURVEYS:  FOTO not tested at eval Completed  10/26/22: FOTO score 59, Goals 61  COGNITION: Overall cognitive status: Within functional limits for tasks assessed     SENSATION: Hot/Cold: legs "feel cold" Legs go numb frequently  E POSTURE:  guarded posturing; antalgic gait   LOWER EXTREMITY ROM:  Active ROM Right eval Left eval Right 11/11/22 Left 11/11/22  Hip flexion      Hip extension      Hip abduction      Hip adduction      Hip internal rotation      Hip external rotation      Knee flexion 119 109 115 114  Knee extension      Ankle dorsiflexion      Ankle plantarflexion      Ankle inversion      Ankle eversion      Lumbar flexion 60% available at eval      Lumbar extension 10% available at eval       (Blank rows = not tested)  LOWER EXTREMITY MMT:  MMT Right eval Left eval Right 11/11/22 Left 11/11/22  Hip flexion 3- 3- 3* 3+  Hip extension      Hip abduction  Hip adduction      Hip internal rotation      Hip external rotation      Knee flexion      Knee extension 3- 3- 4 4  Ankle dorsiflexion 4- _0 Ankle plantarflexion      Ankle inversion      Ankle eversion       (Blank rows = not tested)  FUNCTIONAL TESTS:  5 times sit to stand: 32 sec Timed up and go (TUG): 20.5 sec average 2 trials     TODAY'S TREATMENT     11/11/22 Progress note 5 times sit to stand 17.10 sec TUG 11.44 sec MMT and knee AROM see above FOTO 39  Supine: LTR x 20 Bridge x 20 SLR x 10 each  Prone: Glute sets 5" hold x 20 Hamstring curls 2 x 10 each Hip extensions 2 x 10  Review of HEP     11/05/22 LTR x 20  Bridge x 20  SLR 2 x 10 bilateral  Prone hamstring curls 2 x 10 bilateral  Prone hip extension 2 x 10 bilateral  LAQ 10 x 5 second holds bilateral  Standing HR/TR 2x 10 each Standing hip abduction 2 x 10 bilateral STS 1 x 10   11/02/22 Supine: LTR x 10 Transverse abdominus contraction 5" x 10 Transverse abdominus contraction with ball squeeze for hip adduction  5" hold x  10 Transverse abdominus contraction with belt for hip abduction 5" x 10 Bridge 2x10 SLR 2x10  Prone: glute sets 5" hold x 10 Hamstring curls 2 x 10 each  Sitting: Shoulder retractions 2 x 10 Heel/toe raises 2 x 10 LAQ's  2 x 10  Standing: Slant board 3 x 30"   10/28/22 Supine: LTR x 20 Transverse abdominus contraction 5" x 10 Transverse abdominus contraction with ball squeeze for hip adduction  5" hold x 10 Transverse abdominus contraction with belt for hip abduction 5" x 10  Prone: Prone lying x 1' glute sets 5" hold x 10 Hamstring curls 2 x 10 each Prone on elbows 1'  Sitting: Shoulder retractions 2 x 10 Heel/toe raises 2 x 10 LAQ's  x 10  Standing: Slant board 5 x 20"                                                                      10/26/22 Reviewed HEP and Goals FOTO Performed with abd bracing techniques: SLR 3x5 Hooklying ball press 2x10 Sidelying clamshell 2x10 Hooklying hip adduction squeeze 2x10    PATIENT EDUCATION:  Education details: 11/05/22: HEP, exercise mechanics, rationale of exercises' EVAL:Patient educated on exam findings, POC, scope of PT, HEP Person educated: Patient Education method: Explanation, Demonstration, and Handouts Education comprehension: verbalized understanding, returned demonstration, verbal cues required, and tactile cues required  HOME EXERCISE PROGRAM: Access Code: [email or web address]  11/05/22- Prone Hip Extension  - 1 x daily - 7 x weekly - 2 sets - 10 reps - Standing Heel Raises  - 1 x daily - 7 x weekly - 2 sets - 10 reps - Toe Raises with Counter Support  - 1 x daily - 7 x weekly - 2 sets - 10 reps - Sit to Stand  - 1 x daily - 7  x weekly - 2 sets - 10 reps  10/28/22 prone lying, prone on elbows, scapular retractions Date: 10/14/2022 Prepared by: [facility]  Exercises - Supine Lower Trunk Rotation  - 2 x daily - 7 x weekly - 1 sets - 10 reps - Seated Heel Toe Raises  - 2 x daily - 7 x weekly - 1 sets - 10 reps -  Seated Long Arc Quad  - 2 x daily - 7 x weekly - 1 sets - 10 reps - Seated March  - 2 x daily - 7 x weekly - 1 sets - 10 reps  ASSESSMENT:  CLINICAL IMPRESSION: Progress note today.  Patient with good improvement with functional testing and strength but continues with subjective complaints of high pain levels in back and legs. Patient met both STG's and 1 LTG.  She requests discharge to independent HEP at this time.        OBJECTIVE IMPAIRMENTS: Abnormal gait, decreased activity tolerance, decreased balance, decreased endurance, decreased knowledge of condition, decreased mobility, difficulty walking, decreased ROM, decreased strength, hypomobility, increased fascial restrictions, impaired perceived functional ability, increased muscle spasms, impaired flexibility, and pain.   ACTIVITY LIMITATIONS: carrying, lifting, bending, sitting, standing, squatting, sleeping, stairs, transfers, bed mobility, bathing, dressing, reach over head, hygiene/grooming, locomotion level, and caring for others  PARTICIPATION LIMITATIONS: meal prep, cleaning, laundry, driving, shopping, community activity, and occupation  REHAB POTENTIAL: Good  CLINICAL DECISION MAKING: Evolving/moderate complexity  EVALUATION COMPLEXITY: Moderate   GOALS: Goals reviewed with patient? No  SHORT TERM GOALS: Target date: 10/28/2022  Patient will be independent with initial HEP  Baseline: Goal status: MET  2.  Patient will improve 5 times sit to stand score from 32 sec to 28 sec to demonstrate improved functional mobility and increased lower extremity strength.   Baseline: 32 sec Goal status: MET    LONG TERM GOALS: Target date: 11/11/2022    Patient will report at least 50% improvement in overall symptoms and/or function to demonstrate improved functional mobility   Baseline:  Goal status: IN PROGRESS  2.  Patient will improve 5 times sit to stand score from 32 sec to 24 sec to demonstrate improved functional  mobility and increased lower extremity strength.   Baseline: 32 sec Goal status: MET  3.  Patient will increase left leg MMTs to 5/5 without pain to promote return to ambulation community distances with minimal deviation.  Baseline: see above Goal status: INITIAL  4.  Patient will improve lumbar extension to 30% with pain levels at 3/10 or less to improve tolerance for reaching activities in kitchen Baseline:  Goal status: INITIAL   PLAN:  PT FREQUENCY: 2x/week  PT DURATION: 4 weeks  PLANNED INTERVENTIONS: Therapeutic exercises, Therapeutic activity, Neuromuscular re-education, Balance training, Gait training, Patient/Family education, Joint manipulation, Joint mobilization, Stair training, Orthotic/Fit training, DME instructions, Aquatic Therapy, Dry Needling, Electrical stimulation, Spinal manipulation, Spinal mobilization, Cryotherapy, Moist heat, Compression bandaging, scar mobilization, Splintting, Taping, Traction, Ultrasound, Ionotophoresis 19m/ml Dexamethasone, and Manual therapy  PLAN FOR NEXT SESSION: discharge   8:57 AM, 11/11/22 Zavia Pullen Small Carisma Troupe MPT [facility] [place] #[phone]:[phone]

## 2022-11-16 ENCOUNTER — Encounter (HOSPITAL_COMMUNITY): Payer: Medicare Other

## 2022-11-19 ENCOUNTER — Encounter (HOSPITAL_COMMUNITY): Payer: Medicare Other

## 2022-11-23 ENCOUNTER — Encounter (HOSPITAL_COMMUNITY): Payer: Medicare Other | Admitting: Physical Therapy

## 2022-11-24 ENCOUNTER — Ambulatory Visit
Admission: RE | Admit: 2022-11-24 | Discharge: 2022-11-24 | Disposition: A | Discharge status: Home / Self Care | Payer: Medicare Other | Source: Ambulatory Visit | Attending: Nurse Practitioner | Admitting: Nurse Practitioner

## 2022-11-24 DIAGNOSIS — G8929 Other chronic pain: Secondary | ICD-10-CM

## 2022-11-24 MED ORDER — METHYLPREDNISOLONE ACETATE 40 MG/ML INJ SUSP (RADIOLOG
80.0000 mg | Freq: Once | INTRAMUSCULAR | Status: AC
  Administered 2022-11-24: 80 mg via EPIDURAL

## 2022-11-24 NOTE — Discharge Instructions (Signed)

## 2022-11-25 ENCOUNTER — Encounter (HOSPITAL_COMMUNITY): Payer: Medicare Other

## 2023-10-06 ENCOUNTER — Telehealth: Payer: Self-pay | Admitting: Internal Medicine

## 2023-10-06 NOTE — Telephone Encounter (Signed)
Needs new patient approval to reschedule

## 2023-10-06 NOTE — Telephone Encounter (Signed)
Patient informed. 

## 2023-10-07 ENCOUNTER — Ambulatory Visit: Payer: 59 | Admitting: Internal Medicine

## 2023-10-07 DIAGNOSIS — K219 Gastro-esophageal reflux disease without esophagitis: Secondary | ICD-10-CM | POA: Diagnosis not present

## 2023-10-07 DIAGNOSIS — Z7689 Persons encountering health services in other specified circumstances: Secondary | ICD-10-CM | POA: Diagnosis not present

## 2023-10-07 DIAGNOSIS — G894 Chronic pain syndrome: Secondary | ICD-10-CM | POA: Diagnosis not present

## 2023-10-07 DIAGNOSIS — J4520 Mild intermittent asthma, uncomplicated: Secondary | ICD-10-CM | POA: Diagnosis not present

## 2023-10-07 DIAGNOSIS — I10 Essential (primary) hypertension: Secondary | ICD-10-CM | POA: Diagnosis not present

## 2023-10-11 ENCOUNTER — Other Ambulatory Visit (HOSPITAL_COMMUNITY): Payer: Self-pay | Admitting: Internal Medicine

## 2023-10-11 DIAGNOSIS — Z1231 Encounter for screening mammogram for malignant neoplasm of breast: Secondary | ICD-10-CM

## 2023-10-25 ENCOUNTER — Ambulatory Visit (HOSPITAL_COMMUNITY)
Admission: RE | Admit: 2023-10-25 | Discharge: 2023-10-25 | Disposition: A | Discharge status: Home / Self Care | Payer: 59 | Source: Ambulatory Visit | Attending: Internal Medicine | Admitting: Internal Medicine

## 2023-10-25 DIAGNOSIS — Z1231 Encounter for screening mammogram for malignant neoplasm of breast: Secondary | ICD-10-CM | POA: Insufficient documentation

## 2023-11-16 ENCOUNTER — Other Ambulatory Visit (HOSPITAL_COMMUNITY)
Admission: RE | Admit: 2023-11-16 | Discharge: 2023-11-16 | Disposition: A | Discharge status: Home / Self Care | Payer: 59 | Source: Ambulatory Visit | Attending: Obstetrics & Gynecology | Admitting: Obstetrics & Gynecology

## 2023-11-16 ENCOUNTER — Ambulatory Visit: Payer: 59 | Admitting: Obstetrics & Gynecology

## 2023-11-16 ENCOUNTER — Encounter: Payer: Self-pay | Admitting: Obstetrics & Gynecology

## 2023-11-16 VITALS — BP 126/75 | HR 85 | Ht 60.0 in | Wt 199.4 lb

## 2023-11-16 DIAGNOSIS — Z113 Encounter for screening for infections with a predominantly sexual mode of transmission: Secondary | ICD-10-CM

## 2023-11-16 DIAGNOSIS — Z01419 Encounter for gynecological examination (general) (routine) without abnormal findings: Secondary | ICD-10-CM

## 2023-11-16 NOTE — Progress Notes (Signed)
   WELL-WOMAN EXAMINATION Patient name: [name] MRN [id number]  Date of birth: [birth year] Chief Complaint:   Gynecologic Exam  History of Present Illness:   [name] is a 67 y.o. G3P2010 PM, PH female being seen today for a routine well-woman exam.   Denies vaginal bleeding, discharge, itching or irritation.  Denies pelvic or abdominal pain.  Patient has been sexually active with new partner, using condoms regularly.  She denies dyspareunia, but she is concerned about vaginal dryness.  No LMP recorded. Patient has had a hysterectomy.   Last pap NA- s/p hysterectomy.  Last mammogram: 10/2023. Last colonoscopy: 2018     11/16/2023    9:58 AM 10/06/2021   10:17 AM  Depression screen PHQ 2/9  Decreased Interest 0 0  Down, Depressed, Hopeless 0 0  PHQ - 2 Score 0 0  Altered sleeping 0 0  Tired, decreased energy 0 0  Change in appetite 0 0  Feeling bad or failure about yourself  0 1  Trouble concentrating 0 0  Moving slowly or fidgety/restless 0 2  Suicidal thoughts 0 0  PHQ-9 Score 0 3      Review of Systems:   Pertinent items are noted in HPI Denies any headaches, blurred vision, fatigue, shortness of breath, chest pain, abdominal pain, bowel movements, urination, or intercourse unless otherwise stated above.  Pertinent History Reviewed:  Reviewed past medical,surgical, social and family history.  Reviewed problem list, medications and allergies. Physical Assessment:   Vitals:   11/16/23 0949  BP: 126/75  Pulse: 85  Weight: 199 lb 6.4 oz (90.4 kg)  Height: 5' (1.524 m)  Body mass index is 38.94 kg/m.        Physical Examination:   General appearance - well appearing, and in no distress  Mental status - alert, oriented to person, place, and time  Psych:  She has a normal mood and affect  Skin - warm and dry, normal color, no suspicious lesions noted  Chest - effort normal, all lung fields clear to auscultation bilaterally  Heart - normal rate and  regular rhythm  Neck:  midline trachea, no thyromegaly or nodules  Breasts - breasts appear normal, no suspicious masses, no skin or nipple changes or  axillary nodes  Abdomen - soft, nontender, nondistended, no masses or organomegaly  Pelvic - VULVA: normal appearing vulva with no masses, tenderness or lesions  VAGINA: normal appearing vagina with normal color and discharge, no lesions  Uterus and cervix surgically absent  ADNEXA: No adnexal masses or tenderness noted.  Extremities:  No swelling or varicosities noted  Chaperone:  Dr. Elberta Fortis      Assessment & Plan:  1) Well-Woman Exam -Pap not indicated due to prior hysterectomy -Mammogram up to date  2) vaginal dryness\atrophy -Exam grossly normal -Discussed over-the-counter management options including Ludivina and reviewed water-based lubricants -Should she note worsening of her symptoms return to clinic  3) STI screening -screening to be completed today -Reviewed safe sex practices  No orders of the defined types were placed in this encounter.   Meds: No orders of the defined types were placed in this encounter.   Follow-up: Return for 2 year annual.   Myna Hidalgo, DO Attending Obstetrician & Gynecologist, [facility] for [employer], [facility]

## 2023-11-17 LAB — CERVICOVAGINAL ANCILLARY ONLY
Chlamydia: NEGATIVE
Comment: NEGATIVE
Comment: NEGATIVE
Comment: NORMAL
Neisseria Gonorrhea: NEGATIVE
Trichomonas: NEGATIVE

## 2023-11-22 DIAGNOSIS — I10 Essential (primary) hypertension: Secondary | ICD-10-CM | POA: Diagnosis not present

## 2023-11-22 DIAGNOSIS — R7301 Impaired fasting glucose: Secondary | ICD-10-CM | POA: Diagnosis not present

## 2023-11-25 DIAGNOSIS — E785 Hyperlipidemia, unspecified: Secondary | ICD-10-CM | POA: Diagnosis not present

## 2023-11-25 DIAGNOSIS — Z0001 Encounter for general adult medical examination with abnormal findings: Secondary | ICD-10-CM | POA: Diagnosis not present

## 2023-11-25 DIAGNOSIS — G894 Chronic pain syndrome: Secondary | ICD-10-CM | POA: Diagnosis not present

## 2023-11-25 DIAGNOSIS — K219 Gastro-esophageal reflux disease without esophagitis: Secondary | ICD-10-CM | POA: Diagnosis not present

## 2023-11-25 DIAGNOSIS — R7303 Prediabetes: Secondary | ICD-10-CM | POA: Diagnosis not present

## 2023-11-25 DIAGNOSIS — J4520 Mild intermittent asthma, uncomplicated: Secondary | ICD-10-CM | POA: Diagnosis not present

## 2023-11-25 DIAGNOSIS — N289 Disorder of kidney and ureter, unspecified: Secondary | ICD-10-CM | POA: Diagnosis not present

## 2023-11-25 DIAGNOSIS — I10 Essential (primary) hypertension: Secondary | ICD-10-CM | POA: Diagnosis not present

## 2023-11-25 DIAGNOSIS — E870 Hyperosmolality and hypernatremia: Secondary | ICD-10-CM | POA: Diagnosis not present

## 2023-12-22 DIAGNOSIS — M545 Low back pain, unspecified: Secondary | ICD-10-CM

## 2023-12-22 HISTORY — DX: Low back pain, unspecified: M54.50

## 2024-01-05 DIAGNOSIS — L659 Nonscarring hair loss, unspecified: Secondary | ICD-10-CM | POA: Diagnosis not present

## 2024-01-05 DIAGNOSIS — E559 Vitamin D deficiency, unspecified: Secondary | ICD-10-CM | POA: Diagnosis not present

## 2024-01-05 DIAGNOSIS — K219 Gastro-esophageal reflux disease without esophagitis: Secondary | ICD-10-CM | POA: Diagnosis not present

## 2024-01-05 DIAGNOSIS — G894 Chronic pain syndrome: Secondary | ICD-10-CM | POA: Diagnosis not present

## 2024-01-05 DIAGNOSIS — M5459 Other low back pain: Secondary | ICD-10-CM | POA: Diagnosis not present

## 2024-01-05 DIAGNOSIS — Z79899 Other long term (current) drug therapy: Secondary | ICD-10-CM | POA: Diagnosis not present

## 2024-01-05 DIAGNOSIS — J302 Other seasonal allergic rhinitis: Secondary | ICD-10-CM | POA: Diagnosis not present

## 2024-01-05 DIAGNOSIS — M545 Low back pain, unspecified: Secondary | ICD-10-CM | POA: Diagnosis not present

## 2024-01-17 ENCOUNTER — Encounter (HOSPITAL_COMMUNITY): Payer: Self-pay

## 2024-01-17 ENCOUNTER — Ambulatory Visit (HOSPITAL_COMMUNITY): Payer: 59 | Attending: Family Medicine

## 2024-01-17 ENCOUNTER — Other Ambulatory Visit: Payer: Self-pay

## 2024-01-17 DIAGNOSIS — R293 Abnormal posture: Secondary | ICD-10-CM | POA: Diagnosis not present

## 2024-01-17 DIAGNOSIS — M545 Low back pain, unspecified: Secondary | ICD-10-CM | POA: Diagnosis not present

## 2024-01-17 DIAGNOSIS — M6281 Muscle weakness (generalized): Secondary | ICD-10-CM | POA: Diagnosis not present

## 2024-01-17 NOTE — H&P (Signed)
  Patient: [name]  PID: [id number]  DOB: 12-06-1956  SEX: Female   Patient referred by Verne Carrow, DDS for extraction teeth 6, 7, 8, 9, 10, 11, 32  CC: Bad teeth  Past Medical History:  High Blood Pressure, Asthma, Hay Fever or Sinus Problems, Obese, Arthritis    Medications: Aspirin, Alprazolam, Gabapentin, Oxycodone, Ibuprophen    Allergies:     Sulfa, Latex    Surgeries:   knee surgery         Social History       Smoking: n           Alcohol:n Drug use:n                             Exam: BMI 35. Maxillary teeth 6-11 present, poor repair. Mobile teeth 23 - 26. Large caries # 30. Erupted # 32.  No purulence, edema, fluctuance, trismus. Oral cancer screening negative. Pharynx clear. No lymphadenopathy.  Panorex:Attrition teeth # 6, 7, 8.  Bone loss  # 23, 24, 25, 26. Decay #30.  Assessment: ASA 2.  Non-restorable teeth# 6, 7, 8, 9, 10, 11,  32.              Plan: 1. Medical Clearance received.  2. Extraction Teeth # 6, 7, 8, 9, 10, 11, 32.  Alveoloplasty as indicated. IV Sedation/hospital.                Rx: none               Risks and complications explained. Questions answered.   Georgia Lopes, DMD

## 2024-01-17 NOTE — Therapy (Signed)
OUTPATIENT PHYSICAL THERAPY THORACOLUMBAR EVALUATION   Patient Name: [name] MRN: [id number] DOB:November 24, 1956, 68 y.o., female Today's Date: 01/17/2024  END OF SESSION:  PT End of Session - 01/17/24 1244     Visit Number 1    Date for PT Re-Evaluation 02/28/24    Authorization Type [employer] Dual Complete    Authorization Time Period no auth no limit    Progress Note Due on Visit 10    PT Start Time 0930    PT Stop Time 1000    PT Time Calculation (min) 30 min    Activity Tolerance Patient tolerated treatment well    Behavior During Therapy WFL for tasks assessed/performed             Past Medical History:  Diagnosis Date   Anxiety    Asthma    Chronic knee pain    Diverticulitis    Headache    Hypertension    Seasonal allergies    Vertigo    Past Surgical History:  Procedure Laterality Date   ABDOMINAL HYSTERECTOMY     ABLATION COLPOCLESIS     CHOLECYSTECTOMY     COLONOSCOPY WITH PROPOFOL N/A 10/04/2017   Procedure: COLONOSCOPY WITH PROPOFOL;  Surgeon: Malissa Hippo, MD;  Location: [facility];  Service: Endoscopy;  Laterality: N/A;  1:15   KNEE ARTHROSCOPY WITH LATERAL MENISECTOMY Right 04/29/2021   Procedure: RIGHT KNEE ARTHROSCOPY WITH LATERAL MENISCECTOMY;  Surgeon: Vickki Hearing, MD;  Location: [facility];  Service: Orthopedics;  Laterality: Right;   POLYPECTOMY  10/04/2017   Procedure: POLYPECTOMY;  Surgeon: Malissa Hippo, MD;  Location: [facility];  Service: Endoscopy;;  sigmoid colon polyp   Patient Active Problem List   Diagnosis Date Noted   s/p right knee arthroscopy lateral meniscectomy 04/29/21 05/06/2021   Old complex tear of lateral meniscus of right knee    Diverticulitis of colon 09/15/2017   Diverticulitis 05/24/2017   AKI (acute kidney injury) (HCC) 05/24/2017   Hypokalemia 05/24/2017   Essential hypertension 05/24/2017   Vertigo 05/24/2017   CAP (community acquired pneumonia) 05/04/2015   Sepsis (HCC) 05/02/2015   Acute  respiratory failure (HCC) 05/02/2015    PCP: Gareth Morgan, MD  REFERRING PROVIDER: Lupita Raider NP  REFERRING DIAG: LOW BACK PAIN   Rationale for Evaluation and Treatment: Rehabilitation  THERAPY DIAG:  Low back pain, unspecified back pain laterality, unspecified chronicity, unspecified whether sciatica present  Muscle weakness (generalized)  Abnormal posture  ONSET DATE: > 2 years  SUBJECTIVE:  SUBJECTIVE STATEMENT: Pt reporting chronic back paint hat has been receiving steroid shots in her low back. These shots were helping but a new provider wanted her to try Physical Therapy before another shot.   PERTINENT HISTORY:    PAIN:  Are you having pain? Yes: NPRS scale: 7/10 Pain location: middle of lumbar spine spanning across bilaterally into BLE.  Pain description: Pin and needles Aggravating factors: sitting down Relieving factors: standing up  PRECAUTIONS: None  RED FLAGS: None   WEIGHT BEARING RESTRICTIONS: No  FALLS:  Has patient fallen in last 6 months? Yes. Number of falls 2x Pt was falling down back steps at 2nd from bottom step. These past weekend, Saturday or Sunday fell getting off bed.   PATIENT GOALS: "just want the pain to leave or calm down a little"  NEXT MD VISIT: March  OBJECTIVE:  Note: Objective measures were completed at Evaluation unless otherwise noted.  DIAGNOSTIC FINDINGS:    PATIENT SURVEYS:  Modified Oswestry 21/50=42%   COGNITION: Overall cognitive status: Within functional limits for tasks assessed     SENSATION: WFL  POSTURE: decreased lumbar lordosis and weight shift left  PALPATION: Right lumbar paraspinals-tender to touch  LUMBAR ROM:   AROM eval  Flexion 100%  Extension 75% and painful  Right lateral flexion   Left lateral  flexion   Right rotation   Left rotation    (Blank rows = not tested)  LOWER EXTREMITY ROM:     Active  Right eval Left eval  Hip flexion    Hip extension    Hip abduction    Hip adduction    Hip internal rotation    Hip external rotation    Knee flexion    Knee extension    Ankle dorsiflexion    Ankle plantarflexion    Ankle inversion    Ankle eversion     (Blank rows = not tested)  LOWER EXTREMITY MMT:    MMT Right eval Left eval  Hip flexion    Hip extension    Hip abduction    Hip adduction    Hip internal rotation    Hip external rotation    Knee flexion    Knee extension    Ankle dorsiflexion    Ankle plantarflexion    Ankle inversion    Ankle eversion     (Blank rows = not tested)   FUNCTIONAL TESTS:  5 times sit to stand: 13 seconds   GAIT: Distance walked: 67ft Assistive device utilized: None Level of assistance: Complete Independence Comments: Antalgic gait pattern with limited swing pattern with LLE during stance of RLE  TREATMENT DATE:  01/17/2024 PT Evaluation  150/603 sitting    140/90 standing   HR 80 and irregularly.                                                                                                                        Evaluation terminated, pt with increased dizziness, poor capacity to keep  eyes open. SpO2 worn and oxygen > 95% entire time with HR varying between 74-84bpm.   Pt was handed off to other staff for monitoring and SpO2 worn sitting in WC while sipping water.   PATIENT EDUCATION:  Education details: PT Evaluation, findings, prognosis, frequency, attendance policy, and HEP if given.  Person educated: Patient Education method: Medical illustrator Education comprehension: verbalized understanding  HOME EXERCISE PROGRAM: Initiate next session  ASSESSMENT:  CLINICAL IMPRESSION: Patient is a 68y.o. female who was seen today for physical therapy evaluation and treatment for LOW BACK PAIN.   Evaluation was primary limited due to onset of anxiety like PTSD-like episode with increased nervousness.  Patient reporting this seems to be brought on via entering hospital-like rooms.  Based upon initial capacity to examine functional mobility and pain patient demonstrates a need for skilled physical therapy services to address low back pain due to reduced muscle weakness and related outcomes associated with pain and disability.   OBJECTIVE IMPAIRMENTS: Abnormal gait, decreased activity tolerance, decreased balance, decreased ROM, decreased strength, impaired flexibility, postural dysfunction, and pain.   ACTIVITY LIMITATIONS: carrying, lifting, bending, sitting, squatting, sleeping, stairs, transfers, and locomotion level  PARTICIPATION LIMITATIONS: driving, community activity, occupation, and yard work  PERSONAL FACTORS: Age, Behavior pattern, and 3+ comorbidities: See chart above  are also affecting patient's functional outcome.   REHAB POTENTIAL: Good  CLINICAL DECISION MAKING: Stable/uncomplicated  EVALUATION COMPLEXITY: Moderate   GOALS: Goals reviewed with patient? No  SHORT TERM GOALS: Target date: 02/07/2024  Pt will be independent with HEP in order to demonstrate participation in Physical Therapy POC.  Baseline: Goal status: INITIAL  2.  Pt will report 5/10 pain with mobility in order to demonstrate improved pain with functional activities.  Baseline:  Goal status: INITIAL  LONG TERM GOALS: Target date: 02/28/2024  Pt will improve 5x STS by <1 seconds in order to demonstrate improved functional strength to return to desired activities.  Baseline: See objective.  Goal status: INITIAL  2.  Pt will improve Modified Oswestry score by 10 points in order to demonstrate improved pain with functional goals and outcomes. Baseline: See objective.  Goal status: INITIAL  3.  Pt will report 3/10 pain with mobility in order to demonstrate reduced pain with overall functional  status.  Baseline: See objective.  Goal status: INITIAL  4.  Pt will perform single leg balance bialterally > 10 seconds in order to demonstrate improved BLE strength and safety during dynamic gait activities.  Baseline: See objective.  Goal status: INITIAL  PLAN:  PT FREQUENCY: 2x/week  PT DURATION: 6 weeks  PLANNED INTERVENTIONS: 97164- PT Re-evaluation, 97110-Therapeutic exercises, 97530- Therapeutic activity, 97112- Neuromuscular re-education, 97535- Self Care, 96045- Manual therapy, [id number]- Gait training, [phone]- Orthotic Fit/training, 97014- Electrical stimulation (unattended), [phone]- Electrical stimulation (manual), [id number]- Traction (mechanical), Patient/Family education, Balance training, and Stair training.  PLAN FOR NEXT SESSION: MMT, ROM, single leg balance  Elie Goody, DPT [facility] [place] [phone] office   Nelida Meuse, PT 01/17/2024, 12:53 PM

## 2024-01-20 ENCOUNTER — Encounter (HOSPITAL_COMMUNITY): Payer: Self-pay | Admitting: Oral Surgery

## 2024-01-20 ENCOUNTER — Other Ambulatory Visit: Payer: Self-pay

## 2024-01-20 NOTE — Progress Notes (Signed)
PCP - Lupita Raider, DNP Cardiologist - none  Chest x-ray - n/a EKG - DOS (01/21/24) Stress Test - n/a ECHO - n/a Cardiac Cath - n/a  ICD Pacemaker/Loop - n/a  Sleep Study -  n/a  Diabetes - n/a  Aspirin and Blood Thinner Instructions:  n/a  NPO   Anesthesia review: no  STOP now taking any Aspirin (unless otherwise instructed by your surgeon), Aleve, Naproxen, Ibuprofen, Motrin, Advil, Goody's, BC's, all herbal medications, fish oil, and all vitamins.   Coronavirus Screening Do you have any of the following symptoms:  Cough yes/no: No Fever (>100.81F)  yes/no: No Runny nose yes/no: No Sore throat yes/no: No Difficulty breathing/shortness of breath  yes/no: No  Have you traveled in the last 14 days and where? yes/no: No  Patient verbalized understanding of instructions that were given via phone.

## 2024-01-21 ENCOUNTER — Other Ambulatory Visit: Payer: Self-pay

## 2024-01-21 ENCOUNTER — Ambulatory Visit (HOSPITAL_COMMUNITY): Payer: 59 | Admitting: Anesthesiology

## 2024-01-21 ENCOUNTER — Encounter (HOSPITAL_COMMUNITY): Payer: Self-pay | Admitting: Oral Surgery

## 2024-01-21 ENCOUNTER — Encounter (HOSPITAL_COMMUNITY)
Admission: RE | Disposition: A | Discharge status: Home / Self Care | Payer: Self-pay | Source: Home / Self Care | Attending: Oral Surgery

## 2024-01-21 ENCOUNTER — Ambulatory Visit (HOSPITAL_BASED_OUTPATIENT_CLINIC_OR_DEPARTMENT_OTHER): Payer: 59 | Admitting: Anesthesiology

## 2024-01-21 ENCOUNTER — Ambulatory Visit (HOSPITAL_COMMUNITY)
Admission: RE | Admit: 2024-01-21 | Discharge: 2024-01-21 | Disposition: A | Discharge status: Home / Self Care | Payer: 59 | Attending: Oral Surgery | Admitting: Oral Surgery

## 2024-01-21 DIAGNOSIS — K0859 Other unsatisfactory restoration of tooth: Secondary | ICD-10-CM

## 2024-01-21 DIAGNOSIS — J45909 Unspecified asthma, uncomplicated: Secondary | ICD-10-CM | POA: Insufficient documentation

## 2024-01-21 DIAGNOSIS — K0889 Other specified disorders of teeth and supporting structures: Secondary | ICD-10-CM | POA: Diagnosis not present

## 2024-01-21 DIAGNOSIS — K0850 Unsatisfactory restoration of tooth, unspecified: Secondary | ICD-10-CM | POA: Diagnosis not present

## 2024-01-21 DIAGNOSIS — E669 Obesity, unspecified: Secondary | ICD-10-CM | POA: Diagnosis not present

## 2024-01-21 DIAGNOSIS — Z6835 Body mass index (BMI) 35.0-35.9, adult: Secondary | ICD-10-CM | POA: Insufficient documentation

## 2024-01-21 HISTORY — DX: Herpesviral infection, unspecified: B00.9

## 2024-01-21 HISTORY — DX: Pneumonia, unspecified organism: J18.9

## 2024-01-21 HISTORY — DX: Gastro-esophageal reflux disease without esophagitis: K21.9

## 2024-01-21 HISTORY — DX: Prediabetes: R73.03

## 2024-01-21 HISTORY — DX: Unspecified osteoarthritis, unspecified site: M19.90

## 2024-01-21 HISTORY — PX: TOOTH EXTRACTION: SHX859

## 2024-01-21 HISTORY — DX: Dyspnea, unspecified: R06.00

## 2024-01-21 LAB — BASIC METABOLIC PANEL
Anion gap: 9 (ref 5–15)
BUN: 11 mg/dL (ref 8–23)
CO2: 24 mmol/L (ref 22–32)
Calcium: 10.8 mg/dL — ABNORMAL HIGH (ref 8.9–10.3)
Chloride: 108 mmol/L (ref 98–111)
Creatinine, Ser: 1.03 mg/dL — ABNORMAL HIGH (ref 0.44–1.00)
GFR, Estimated: 59 mL/min — ABNORMAL LOW (ref >60)
Glucose, Bld: 102 mg/dL — ABNORMAL HIGH (ref 70–99)
Potassium: 3.6 mmol/L (ref 3.5–5.1)
Sodium: 141 mmol/L (ref 135–145)

## 2024-01-21 LAB — CBC
HCT: 39.3 % (ref 36.0–46.0)
Hemoglobin: 12.6 g/dL (ref 12.0–15.0)
MCH: 27.6 pg (ref 26.0–34.0)
MCHC: 32.1 g/dL (ref 30.0–36.0)
MCV: 86 fL (ref 80.0–100.0)
Platelets: 286 10*3/uL (ref 150–400)
RBC: 4.57 MIL/uL (ref 3.87–5.11)
RDW: 14.8 % (ref 11.5–15.5)
WBC: 7.4 10*3/uL (ref 4.0–10.5)
nRBC: 0 % (ref 0.0–0.2)

## 2024-01-21 SURGERY — DENTAL RESTORATION/EXTRACTIONS
Anesthesia: General

## 2024-01-21 MED ORDER — LIDOCAINE 2% (20 MG/ML) 5 ML SYRINGE
INTRAMUSCULAR | Status: AC
  Filled 2024-01-21: qty 5

## 2024-01-21 MED ORDER — ONDANSETRON HCL 4 MG/2ML IJ SOLN
INTRAMUSCULAR | Status: DC | PRN
  Administered 2024-01-21: 4 mg via INTRAVENOUS

## 2024-01-21 MED ORDER — SODIUM CHLORIDE 0.9 % IR SOLN
Status: DC | PRN
  Administered 2024-01-21: 250 mL

## 2024-01-21 MED ORDER — FENTANYL CITRATE (PF) 250 MCG/5ML IJ SOLN
INTRAMUSCULAR | Status: AC
  Filled 2024-01-21: qty 5

## 2024-01-21 MED ORDER — CEFAZOLIN SODIUM-DEXTROSE 2-4 GM/100ML-% IV SOLN
2.0000 g | INTRAVENOUS | Status: AC
  Administered 2024-01-21: 2 g via INTRAVENOUS
  Filled 2024-01-21: qty 100

## 2024-01-21 MED ORDER — CHLORHEXIDINE GLUCONATE 0.12 % MT SOLN
15.0000 mL | Freq: Once | OROMUCOSAL | Status: AC
Start: 2024-01-21 — End: 2024-01-21
  Administered 2024-01-21: 15 mL via OROMUCOSAL
  Filled 2024-01-21: qty 15

## 2024-01-21 MED ORDER — LIDOCAINE-EPINEPHRINE 2 %-1:100000 IJ SOLN
INTRAMUSCULAR | Status: AC
  Filled 2024-01-21: qty 1

## 2024-01-21 MED ORDER — LIDOCAINE-EPINEPHRINE 2 %-1:100000 IJ SOLN
INTRAMUSCULAR | Status: DC | PRN
  Administered 2024-01-21: 15 mL via INTRADERMAL

## 2024-01-21 MED ORDER — SUCCINYLCHOLINE CHLORIDE 200 MG/10ML IV SOSY
PREFILLED_SYRINGE | INTRAVENOUS | Status: AC
  Filled 2024-01-21: qty 10

## 2024-01-21 MED ORDER — AMOXICILLIN 500 MG PO CAPS
500.0000 mg | ORAL_CAPSULE | Freq: Three times a day (TID) | ORAL | 0 refills | Status: DC
Start: 2024-01-21 — End: 2024-05-03

## 2024-01-21 MED ORDER — DEXAMETHASONE SODIUM PHOSPHATE 10 MG/ML IJ SOLN
INTRAMUSCULAR | Status: DC | PRN
  Administered 2024-01-21: 8 mg via INTRAVENOUS

## 2024-01-21 MED ORDER — LACTATED RINGERS IV SOLN: INTRAVENOUS | Status: DC

## 2024-01-21 MED ORDER — PROPOFOL 10 MG/ML IV BOLUS
INTRAVENOUS | Status: DC | PRN
  Administered 2024-01-21: 150 mg via INTRAVENOUS

## 2024-01-21 MED ORDER — OXYCODONE HCL 5 MG PO TABS: 5.0000 mg | ORAL_TABLET | ORAL | 0 refills | Status: AC | PRN

## 2024-01-21 MED ORDER — FENTANYL CITRATE (PF) 250 MCG/5ML IJ SOLN
INTRAMUSCULAR | Status: DC | PRN
  Administered 2024-01-21 (×2): 50 ug via INTRAVENOUS

## 2024-01-21 MED ORDER — ORAL CARE MOUTH RINSE: 15.0000 mL | Freq: Once | OROMUCOSAL | Status: AC

## 2024-01-21 MED ORDER — 0.9 % SODIUM CHLORIDE (POUR BTL) OPTIME
TOPICAL | Status: DC | PRN
  Administered 2024-01-21: 1000 mL

## 2024-01-21 MED ORDER — LIDOCAINE 2% (20 MG/ML) 5 ML SYRINGE
INTRAMUSCULAR | Status: DC | PRN
  Administered 2024-01-21: 50 mg via INTRAVENOUS

## 2024-01-21 MED ORDER — IBUPROFEN 800 MG PO TABS: 800.0000 mg | ORAL_TABLET | Freq: Three times a day (TID) | ORAL | 0 refills | Status: DC | PRN

## 2024-01-21 MED ORDER — PROPOFOL 10 MG/ML IV BOLUS
INTRAVENOUS | Status: AC
  Filled 2024-01-21: qty 20

## 2024-01-21 MED ORDER — SUCCINYLCHOLINE CHLORIDE 200 MG/10ML IV SOSY
PREFILLED_SYRINGE | INTRAVENOUS | Status: DC | PRN
  Administered 2024-01-21: 100 mg via INTRAVENOUS

## 2024-01-21 SURGICAL SUPPLY — 28 items
BAG COUNTER SPONGE SURGICOUNT (BAG) IMPLANT
BLADE SURG 15 STRL LF DISP TIS (BLADE) ×1 IMPLANT
BUR CROSS CUT FISSURE 1.6 (BURR) ×1 IMPLANT
BUR EGG ELITE 4.0 (BURR) ×1 IMPLANT
CANISTER SUCT 3000ML PPV (MISCELLANEOUS) ×1 IMPLANT
COVER SURGICAL LIGHT HANDLE (MISCELLANEOUS) ×1 IMPLANT
GAUZE PACKING FOLDED 2 STR (GAUZE/BANDAGES/DRESSINGS) ×1 IMPLANT
GLOVE BIO SURGEON STRL SZ8 (GLOVE) ×1 IMPLANT
GOWN STRL REUS W/ TWL LRG LVL3 (GOWN DISPOSABLE) ×1 IMPLANT
GOWN STRL REUS W/ TWL XL LVL3 (GOWN DISPOSABLE) ×1 IMPLANT
IV NS 1000ML BAXH (IV SOLUTION) ×1 IMPLANT
KIT BASIN OR (CUSTOM PROCEDURE TRAY) ×1 IMPLANT
KIT TURNOVER KIT B (KITS) ×1 IMPLANT
NDL HYPO 25GX1X1/2 BEV (NEEDLE) ×2 IMPLANT
NEEDLE HYPO 25GX1X1/2 BEV (NEEDLE) ×2 IMPLANT
NS IRRIG 1000ML POUR BTL (IV SOLUTION) ×1 IMPLANT
PAD ARMBOARD 7.5X6 YLW CONV (MISCELLANEOUS) ×1 IMPLANT
SLEEVE IRRIGATION ELITE 7 (MISCELLANEOUS) ×1 IMPLANT
SPIKE FLUID TRANSFER (MISCELLANEOUS) ×1 IMPLANT
SPONGE SURGIFOAM ABS GEL 12-7 (HEMOSTASIS) IMPLANT
SUT MNCRL 4-0 27 PS-2 XMFL (SUTURE) ×1
SUT PLAIN 3 0 PS2 27 (SUTURE) ×1 IMPLANT
SUTURE MNCRL 4-0 27XMF (SUTURE) IMPLANT
SYR BULB IRRIG 60ML STRL (SYRINGE) ×1 IMPLANT
SYR CONTROL 10ML LL (SYRINGE) ×1 IMPLANT
TRAY ENT MC OR (CUSTOM PROCEDURE TRAY) ×1 IMPLANT
TUBING IRRIGATION (MISCELLANEOUS) ×1 IMPLANT
YANKAUER SUCT BULB TIP NO VENT (SUCTIONS) ×1 IMPLANT

## 2024-01-21 NOTE — Anesthesia Preprocedure Evaluation (Signed)
Anesthesia Evaluation  Patient identified by MRN, date of birth, ID band Patient awake    Reviewed: Allergy & Precautions, NPO status , Patient's Chart, lab work & pertinent test results, reviewed documented beta blocker date and time   Airway Mallampati: II       Dental no notable dental hx. (+) Poor Dentition, Dental Advisory Given, Chipped   Pulmonary shortness of breath and with exertion, asthma , pneumonia, resolved   Pulmonary exam normal breath sounds clear to auscultation       Cardiovascular hypertension, Pt. on medications Normal cardiovascular exam Rhythm:Regular Rate:Normal     Neuro/Psych  Headaches  Anxiety        GI/Hepatic Neg liver ROS,GERD  Medicated,,Non restorable teeth   Endo/Other  negative endocrine ROS    Renal/GU Renal InsufficiencyRenal disease  negative genitourinary   Musculoskeletal  (+) Arthritis , Osteoarthritis,    Abdominal  (+) + obese  Peds  Hematology   Anesthesia Other Findings   Reproductive/Obstetrics                              Anesthesia Physical Anesthesia Plan  ASA: 2  Anesthesia Plan: General   Post-op Pain Management: Minimal or no pain anticipated   Induction: Intravenous  PONV Risk Score and Plan: 4 or greater and Treatment may vary due to age or medical condition, Ondansetron and Dexamethasone  Airway Management Planned: Nasal ETT  Additional Equipment: None  Intra-op Plan:   Post-operative Plan: Extubation in OR  Informed Consent: I have reviewed the patients History and Physical, chart, labs and discussed the procedure including the risks, benefits and alternatives for the proposed anesthesia with the patient or authorized representative who has indicated his/her understanding and acceptance.     Dental advisory given  Plan Discussed with: CRNA, Surgeon and Anesthesiologist  Anesthesia Plan Comments:           Anesthesia Quick Evaluation

## 2024-01-21 NOTE — H&P (Signed)
 H&P documentation  -History and Physical Reviewed  -Patient has been re-examined  -No change in the plan of care  [name]

## 2024-01-21 NOTE — Op Note (Signed)
01/21/2024  11:13 AM  PATIENT:  [name]  68 y.o. female  PRE-OPERATIVE DIAGNOSIS:  nonrestorable teeth # 6, 7, 8, 9, 10, 11, 32.  POST-OPERATIVE DIAGNOSIS:  SAME  PROCEDURE:  Procedure(s):EXTRACTIONS NUMBER 6, 7, 8, 9, 10, 11, 32,   SURGEON:  Surgeon(s): Ocie Doyne, DMD  ANESTHESIA:   local and general  EBL:  minimal  DRAINS: none   SPECIMEN:  No Specimen  COUNTS:  YES  PLAN OF CARE: Discharge to home after PACU  PATIENT DISPOSITION:  PACU - hemodynamically stable.   PROCEDURE DETAILS: Dictation #[id number]  Georgia Lopes, DMD 01/21/2024 11:13 AM

## 2024-01-21 NOTE — Anesthesia Procedure Notes (Signed)
Procedure Name: Intubation Date/Time: 01/21/2024 10:44 AM  Performed by: Bartholomew Crews, CRNAPre-anesthesia Checklist: Patient identified Patient Re-evaluated:Patient Re-evaluated prior to induction Oxygen Delivery Method: Circle system utilized Preoxygenation: Pre-oxygenation with 100% oxygen Induction Type: IV induction Ventilation: Two handed mask ventilation required Laryngoscope Size: Mac and 4 Grade View: Grade I Tube type: Oral Rae Tube size: 7.0 mm Number of attempts: 1 Secured at: 20 cm Tube secured with: Tape Dental Injury: Teeth and Oropharynx as per pre-operative assessment

## 2024-01-21 NOTE — Transfer of Care (Signed)
Immediate Anesthesia Transfer of Care Note  Patient: [name]  Procedure(s) Performed: DENTAL RESTORATION/EXTRACTIONS NUMBER 6, 7, 8, 9, 10, 11, 32, AND ALVEOLOPLASTY  Patient Location: PACU  Anesthesia Type:General  Level of Consciousness: awake, alert , and oriented  Airway & Oxygen Therapy: Patient Spontanous Breathing  Post-op Assessment: Report given to RN  Post vital signs: Reviewed and stable  Last Vitals:  Vitals Value Taken Time  BP 118/49 01/21/24 1126  Temp    Pulse 87 01/21/24 1129  Resp 19 01/21/24 1129  SpO2 89 % 01/21/24 1129  Vitals shown include unfiled device data.  Last Pain:  Vitals:   01/21/24 0859  TempSrc:   PainSc: 0-No pain         Complications: No notable events documented.

## 2024-01-21 NOTE — Anesthesia Postprocedure Evaluation (Signed)
Anesthesia Post Note  Patient: ASHE GRAYBEAL  Procedure(s) Performed: DENTAL RESTORATION/EXTRACTIONS NUMBER 6, 7, 8, 9, 10, 11, 32, AND ALVEOLOPLASTY     Patient location during evaluation: PACU Anesthesia Type: General Level of consciousness: awake and alert Pain management: pain level controlled Vital Signs Assessment: post-procedure vital signs reviewed and stable Respiratory status: spontaneous breathing, nonlabored ventilation, respiratory function stable and patient connected to nasal cannula oxygen Cardiovascular status: blood pressure returned to baseline and stable Postop Assessment: no apparent nausea or vomiting Anesthetic complications: no   No notable events documented.  Last Vitals:  Vitals:   01/21/24 1145 01/21/24 1200  BP: (!) 143/70 (!) 148/73  Pulse: 83 86  Resp: 17 19  Temp:  36.7 C  SpO2: 95% 91%    Last Pain:  Vitals:   01/21/24 1126  TempSrc:   PainSc: 0-No pain                 Mariann Barter

## 2024-01-21 NOTE — Op Note (Unsigned)
[name], DIMAANO MEDICAL RECORD NO: [id number] ACCOUNT NO: [id number] DATE OF BIRTH: 10-Feb-1956 FACILITY: [facility] LOCATION: [facility] PHYSICIAN: Georgia Lopes, DDS  Operative Report   DATE OF PROCEDURE: 01/21/2024  PREOPERATIVE DIAGNOSIS:  Nonabsorbable teeth numbers 6, 7, 8, 9, 10, 11, and 32.  POSTOPERATIVE DIAGNOSIS:  Nonabsorbable teeth numbers 6, 7, 8, 9, 10, 11, and 32.  PROCEDURE:  Extraction of teeth numbers 6, 7, 8, 9, 10, 11, and 32.  SURGEON:  Georgia Lopes, DDS  ANESTHESIA:  General.  Dr. Ace Gins attending, oral intubation.  DESCRIPTION OF PROCEDURE:  The patient was taken to the operating room and placed on the table in the supine position.  General anesthesia was administered and an oral endotracheal tube was placed and secured.  The eyes were protected.  The patient was  draped for surgery.  Timeout was performed.  The posterior pharynx was suctioned and a throat pack was placed.  2% lidocaine 1:100,000 epinephrine was infiltrated in an inferior alveolar block on the right side and in buccal and palatal infiltration  around the maxillary teeth to be removed.  A bite block was placed on the left side.  A 15 blade was used to make an incision in the gingival sulcus buckling lingually around tooth #32.  The periosteum was reflected.  The tooth was elevated and removed  with the dental forceps.  The socket was curetted and irrigated and no surgery was required.  Then, attention was turned to the maxilla.  A 15 blade was used to make an incision beginning at tooth #11, carried forward to tooth #6 in the buccal and the  palatal sulcus.  The periosteum was reflected around these teeth.  The teeth were elevated with a 301 elevator and then teeth numbers 7, 8, 9, 10 removed with the dental forceps.  Teeth numbers 6 and 11 required removal of circumferential bone after  lengthening of the incision on the right and left side to allow access to the bone around these teeth.  The [employer]  handpiece was used with the fissure bur under irrigation and then bone was removed around the teeth.  The teeth were then elevated and  removed with the dental forceps.  Then, the sockets were curetted, irrigated, and closed with 3-0 chromic.  The oral cavity was then irrigated and suctioned.  The throat pack was removed.  The patient was left in the care of anesthesia for extubation and  transported to recovery with plans for discharge to home through day surgery.  ESTIMATED BLOOD LOSS:  Minimum.  COMPLICATIONS:  None.  SPECIMENS:  None.   PUS D: 01/21/2024 11:16:19 am T: 01/21/2024 2:43:00 pm  JOB: [id number]/ [id number]

## 2024-01-22 ENCOUNTER — Encounter (HOSPITAL_COMMUNITY): Payer: Self-pay | Admitting: Oral Surgery

## 2024-01-26 ENCOUNTER — Ambulatory Visit (HOSPITAL_COMMUNITY): Payer: 59 | Attending: Family Medicine

## 2024-01-26 ENCOUNTER — Encounter (HOSPITAL_COMMUNITY): Payer: Self-pay

## 2024-01-26 DIAGNOSIS — R293 Abnormal posture: Secondary | ICD-10-CM | POA: Insufficient documentation

## 2024-01-26 DIAGNOSIS — M6281 Muscle weakness (generalized): Secondary | ICD-10-CM | POA: Diagnosis not present

## 2024-01-26 DIAGNOSIS — M545 Low back pain, unspecified: Secondary | ICD-10-CM | POA: Insufficient documentation

## 2024-01-26 DIAGNOSIS — M79604 Pain in right leg: Secondary | ICD-10-CM | POA: Diagnosis not present

## 2024-01-26 NOTE — Therapy (Signed)
 OUTPATIENT PHYSICAL THERAPY THORACOLUMBAR TREATMENT   Patient Name: [name] MRN: [id number] DOB:10-Jun-1956, 68 y.o., female Today's Date: 01/26/2024  END OF SESSION:  PT End of Session - 01/26/24 1432     Visit Number 2    Number of Visits 12    Date for PT Re-Evaluation 02/28/24    Authorization Type [employer] Dual Complete    Authorization Time Period no auth no limit    Progress Note Due on Visit 10    PT Start Time 1435    PT Stop Time 1518    PT Time Calculation (min) 43 min    Activity Tolerance Patient tolerated treatment well    Behavior During Therapy WFL for tasks assessed/performed             Past Medical History:  Diagnosis Date   Anxiety    Arthritis    Asthma    Chronic knee pain    Diverticulitis    Dyspnea    with exertion   GERD (gastroesophageal reflux disease)    Headache    HSV infection    Hypertension    Low back pain 12/2023   Pneumonia    x several   Pre-diabetes    no meds, diet controlled   Seasonal allergies    Vertigo    Past Surgical History:  Procedure Laterality Date   ABDOMINAL HYSTERECTOMY     ABLATION COLPOCLESIS     CHOLECYSTECTOMY     COLONOSCOPY WITH PROPOFOL  N/A 10/04/2017   Procedure: COLONOSCOPY WITH PROPOFOL ;  Surgeon: Golda Claudis PENNER, MD;  Location: [facility];  Service: Endoscopy;  Laterality: N/A;  1:15   KNEE ARTHROSCOPY WITH LATERAL MENISECTOMY Right 04/29/2021   Procedure: RIGHT KNEE ARTHROSCOPY WITH LATERAL MENISCECTOMY;  Surgeon: Margrette Taft BRAVO, MD;  Location: [facility];  Service: Orthopedics;  Laterality: Right;   POLYPECTOMY  10/04/2017   Procedure: POLYPECTOMY;  Surgeon: Golda Claudis PENNER, MD;  Location: [facility];  Service: Endoscopy;;  sigmoid colon polyp   TOOTH EXTRACTION N/A 01/21/2024   Procedure: DENTAL RESTORATION/EXTRACTIONS NUMBER 6, 7, 8, 9, 10, 11, 32, AND ALVEOLOPLASTY;  Surgeon: Sheryle Hamilton, DMD;  Location: [facility];  Service: Oral Surgery;  Laterality: N/A;   Patient Active  Problem List   Diagnosis Date Noted   s/p right knee arthroscopy lateral meniscectomy 04/29/21 05/06/2021   Old complex tear of lateral meniscus of right knee    Diverticulitis of colon 09/15/2017   Diverticulitis 05/24/2017   AKI (acute kidney injury) (HCC) 05/24/2017   Hypokalemia 05/24/2017   Essential hypertension 05/24/2017   Vertigo 05/24/2017   CAP (community acquired pneumonia) 05/04/2015   Sepsis (HCC) 05/02/2015   Acute respiratory failure (HCC) 05/02/2015    PCP: Nemiah Kemps, MD  REFERRING PROVIDER: Hyacinth Honey NP  REFERRING DIAG: LOW BACK PAIN   Rationale for Evaluation and Treatment: Rehabilitation  THERAPY DIAG:  Low back pain, unspecified back pain laterality, unspecified chronicity, unspecified whether sciatica present  Muscle weakness (generalized)  Abnormal posture  ONSET DATE: > 2 years  SUBJECTIVE:  SUBJECTIVE STATEMENT: 01/26/24: Pt reports she feels good today, no reports of pain.  Eval:  Pt reporting chronic back paint hat has been receiving steroid shots in her low back. These shots were helping but a new provider wanted her to try Physical Therapy before another shot.   PERTINENT HISTORY:    PAIN:  Are you having pain? Yes: NPRS scale: 7/10 Pain location: middle of lumbar spine spanning across bilaterally into BLE.  Pain description: Pin and needles Aggravating factors: sitting down Relieving factors: standing up  PRECAUTIONS: None  RED FLAGS: None   WEIGHT BEARING RESTRICTIONS: No  FALLS:  Has patient fallen in last 6 months? Yes. Number of falls 2x Pt was falling down back steps at 2nd from bottom step. These past weekend, Saturday or Sunday fell getting off bed.   PATIENT GOALS: just want the pain to leave or calm down a little  NEXT MD  VISIT: March  OBJECTIVE:  Note: Objective measures were completed at Evaluation unless otherwise noted.  DIAGNOSTIC FINDINGS:    PATIENT SURVEYS:  Modified Oswestry 21/50=42%   COGNITION: Overall cognitive status: Within functional limits for tasks assessed     SENSATION: WFL  POSTURE: decreased lumbar lordosis and weight shift left  PALPATION: Right lumbar paraspinals-tender to touch  LUMBAR ROM:   AROM eval 01/26/24  Flexion 100%   Extension 75% and painful   Right lateral flexion  WFL  Left lateral flexion  Fingers to knee, painful coming back up  Right rotation  60% painful Lt LBP  Left rotation  75% pain free   (Blank rows = not tested)  LOWER EXTREMITY ROM:     Active  Right 01/26/24 Left Eval  Hip flexion    Hip extension    Hip abduction    Hip adduction    Hip internal rotation    Hip external rotation    Knee flexion 112   Knee extension 3   Ankle dorsiflexion    Ankle plantarflexion    Ankle inversion    Ankle eversion     (Blank rows = not tested)  LOWER EXTREMITY MMT:    MMT Right 01/26/24 Left 01/26/24  Hip flexion 5 4  Hip extension 4- 4-  Hip abduction 4 4-  Hip adduction    Hip internal rotation    Hip external rotation    Knee flexion 4+ 4  Knee extension 3+ 4  Ankle dorsiflexion    Ankle plantarflexion    Ankle inversion    Ankle eversion     (Blank rows = not tested)   FUNCTIONAL TESTS:  5 times sit to stand: 13 seconds   GAIT: Distance walked: 45ft Assistive device utilized: None Level of assistance: Complete Independence Comments: Antalgic gait pattern with limited swing pattern with LLE during stance of RLE  TREATMENT DATE:  01/26/24: Reviewed goals Educated importance of HEP compliance for maximal benefits MMT see above Supine: TrA activation paired with exhalation to reduce holding breath (verbal and tactile cueing) Bridge with ab set 10x 3 ROM SLS Rt 7, LT 21  POE x 2 min Prone press up 5x 10  Shown  skeleton with educated on nerves and posture  01/17/2024 PT Evaluation  150/603 sitting    140/90 standing   HR 80 and irregularly.  Evaluation terminated, pt with increased dizziness, poor capacity to keep eyes open. SpO2 worn and oxygen > 95% entire time with HR varying between 74-84bpm.   Pt was handed off to other staff for monitoring and SpO2 worn sitting in WC while sipping water .   PATIENT EDUCATION:  Education details: PT Evaluation, findings, prognosis, frequency, attendance policy, and HEP if given.  Person educated: Patient Education method: Medical Illustrator Education comprehension: verbalized understanding  HOME EXERCISE PROGRAM: Access Code: [email or web address] URL: [email or web address] Date: 01/26/2024 Prepared by: Augustin Mclean  Exercises - Supine Transversus Abdominis Bracing - Hands on Stomach  - 2 x daily - 7 x weekly - 1 sets - 10 reps - 5 hold - Supine Bridge  - 2 x daily - 7 x weekly - 1 sets - 10 reps - 5 hold - Prone on Elbows Stretch  - 2 x daily - 7 x weekly - 1 sets - 1 reps - 120 hold - Prone Press Up  - 2 x daily - 7 x weekly - 1 sets - 5 reps - 10 hold  ASSESSMENT:  CLINICAL IMPRESSION: 01/26/24:  Reviewed goals and educated importance of HEP compliance for maximal benefits.  Objective testing complete for ROM, MMT and SLS (see above).  Established home exercise program to improve core, lumbar mobility in pain free and gluteal strengthening.  Pt able to complete all exercises with good form and no reports of increased pain.  Pt educated on importance of core strength to assist with lumbar support, require verbal and tactile neuro re-ed cueing to improve contraction and encouraged to complete with exhalation to reduce holding breath through session.  Pt will continue to benefit from education on improving abdominal  activation in future apts.  Pt educated on importance of posture and used skeleton to educate on nerve direction and benefits with extension based exercises to reduce radicular symptoms.  No reports of increased pain through session.  Eval:  Patient is a 68y.o. female who was seen today for physical therapy evaluation and treatment for LOW BACK PAIN.  Evaluation was primary limited due to onset of anxiety like PTSD-like episode with increased nervousness.  Patient reporting this seems to be brought on via entering hospital-like rooms.  Based upon initial capacity to examine functional mobility and pain patient demonstrates a need for skilled physical therapy services to address low back pain due to reduced muscle weakness and related outcomes associated with pain and disability.   OBJECTIVE IMPAIRMENTS: Abnormal gait, decreased activity tolerance, decreased balance, decreased ROM, decreased strength, impaired flexibility, postural dysfunction, and pain.   ACTIVITY LIMITATIONS: carrying, lifting, bending, sitting, squatting, sleeping, stairs, transfers, and locomotion level  PARTICIPATION LIMITATIONS: driving, community activity, occupation, and yard work  PERSONAL FACTORS: Age, Behavior pattern, and 3+ comorbidities: See chart above  are also affecting patient's functional outcome.   REHAB POTENTIAL: Good  CLINICAL DECISION MAKING: Stable/uncomplicated  EVALUATION COMPLEXITY: Moderate   GOALS: Goals reviewed with patient? No  SHORT TERM GOALS: Target date: 02/07/2024  Pt will be independent with HEP in order to demonstrate participation in Physical Therapy POC.  Baseline: Goal status: INITIAL  2.  Pt will report 5/10 pain with mobility in order to demonstrate improved pain with functional activities.  Baseline:  Goal status: INITIAL  LONG TERM GOALS: Target date: 02/28/2024  Pt will improve 5x STS by <1 seconds in order to demonstrate improved functional strength to return to desired  activities.  Baseline: See objective.  Goal status: INITIAL  2.  Pt will improve Modified Oswestry score by 10 points in order to demonstrate improved pain with functional goals and outcomes. Baseline: See objective.  Goal status: INITIAL  3.  Pt will report 3/10 pain with mobility in order to demonstrate reduced pain with overall functional status.  Baseline: See objective.  Goal status: INITIAL  4.  Pt will perform single leg balance bialterally > 10 seconds in order to demonstrate improved BLE strength and safety during dynamic gait activities.  Baseline: See objective.  Goal status: INITIAL  PLAN:  PT FREQUENCY: 2x/week  PT DURATION: 6 weeks  PLANNED INTERVENTIONS: 97164- PT Re-evaluation, 97110-Therapeutic exercises, 97530- Therapeutic activity, 97112- Neuromuscular re-education, 97535- Self Care, 02859- Manual therapy, [id number]- Gait training, [phone]- Orthotic Fit/training, 97014- Electrical stimulation (unattended), [phone]- Electrical stimulation (manual), [id number]- Traction (mechanical), Patient/Family education, Balance training, and Stair training.  PLAN FOR NEXT SESSION: F/U with prone exercises for radicular pain.  Progress core and proximal strengthening. Lumbar mobility.  Augustin Mclean, LPTA/CLT; CBIS [phone]  Mclean Augustin Amble, PTA 01/26/2024, 4:28 PM

## 2024-01-28 ENCOUNTER — Ambulatory Visit (HOSPITAL_COMMUNITY): Payer: 59

## 2024-01-28 DIAGNOSIS — M545 Low back pain, unspecified: Secondary | ICD-10-CM

## 2024-01-28 DIAGNOSIS — M6281 Muscle weakness (generalized): Secondary | ICD-10-CM | POA: Diagnosis not present

## 2024-01-28 DIAGNOSIS — M79604 Pain in right leg: Secondary | ICD-10-CM | POA: Diagnosis not present

## 2024-01-28 DIAGNOSIS — R293 Abnormal posture: Secondary | ICD-10-CM | POA: Diagnosis not present

## 2024-01-28 NOTE — Therapy (Signed)
 OUTPATIENT PHYSICAL THERAPY THORACOLUMBAR TREATMENT   Patient Name: [name] MRN: [id number] DOB:18-Aug-1956, 68 y.o., female Today's Date: 01/28/2024  END OF SESSION:  PT End of Session - 01/28/24 1112     Visit Number 3    Date for PT Re-Evaluation 02/28/24    Authorization Type [employer]HC Dual Complete    Authorization Time Period no auth no limit    Progress Note Due on Visit 10    PT Start Time 1100    PT Stop Time 1140    PT Time Calculation (min) 40 min    Activity Tolerance Patient tolerated treatment well    Behavior During Therapy WFL for tasks assessed/performed            Past Medical History:  Diagnosis Date   Anxiety    Arthritis    Asthma    Chronic knee pain    Diverticulitis    Dyspnea    with exertion   GERD (gastroesophageal reflux disease)    Headache    HSV infection    Hypertension    Low back pain 12/2023   Pneumonia    x several   Pre-diabetes    no meds, diet controlled   Seasonal allergies    Vertigo    Past Surgical History:  Procedure Laterality Date   ABDOMINAL HYSTERECTOMY     ABLATION COLPOCLESIS     CHOLECYSTECTOMY     COLONOSCOPY WITH PROPOFOL  N/A 10/04/2017   Procedure: COLONOSCOPY WITH PROPOFOL ;  Surgeon: Golda Claudis PENNER, MD;  Location: [facility];  Service: Endoscopy;  Laterality: N/A;  1:15   KNEE ARTHROSCOPY WITH LATERAL MENISECTOMY Right 04/29/2021   Procedure: RIGHT KNEE ARTHROSCOPY WITH LATERAL MENISCECTOMY;  Surgeon: Margrette Taft BRAVO, MD;  Location: [facility];  Service: Orthopedics;  Laterality: Right;   POLYPECTOMY  10/04/2017   Procedure: POLYPECTOMY;  Surgeon: Golda Claudis PENNER, MD;  Location: [facility];  Service: Endoscopy;;  sigmoid colon polyp   TOOTH EXTRACTION N/A 01/21/2024   Procedure: DENTAL RESTORATION/EXTRACTIONS NUMBER 6, 7, 8, 9, 10, 11, 32, AND ALVEOLOPLASTY;  Surgeon: Sheryle Hamilton, DMD;  Location: [facility];  Service: Oral Surgery;  Laterality: N/A;   Patient Active Problem List   Diagnosis Date  Noted   s/p right knee arthroscopy lateral meniscectomy 04/29/21 05/06/2021   Old complex tear of lateral meniscus of right knee    Diverticulitis of colon 09/15/2017   Diverticulitis 05/24/2017   AKI (acute kidney injury) (HCC) 05/24/2017   Hypokalemia 05/24/2017   Essential hypertension 05/24/2017   Vertigo 05/24/2017   CAP (community acquired pneumonia) 05/04/2015   Sepsis (HCC) 05/02/2015   Acute respiratory failure (HCC) 05/02/2015    PCP: Nemiah Kemps, MD  REFERRING PROVIDER: Hyacinth Honey NP  REFERRING DIAG: LOW BACK PAIN   Rationale for Evaluation and Treatment: Rehabilitation  THERAPY DIAG:  Low back pain, unspecified back pain laterality, unspecified chronicity, unspecified whether sciatica present  ONSET DATE: > 2 years  SUBJECTIVE:  SUBJECTIVE STATEMENT: 01/28/24: Currently reports of low back pain = 5/10 and the pain is shooting to B LE at the moment. Patient was a little in pain after the last session.  Eval:  Pt reporting chronic back paint hat has been receiving steroid shots in her low back. These shots were helping but a new provider wanted her to try Physical Therapy before another shot.   PERTINENT HISTORY:    PAIN:  Are you having pain? Yes: NPRS scale: 7/10 Pain location: middle of lumbar spine spanning across bilaterally into BLE.  Pain description: Pin and needles Aggravating factors: sitting down Relieving factors: standing up  PRECAUTIONS: None  RED FLAGS: None   WEIGHT BEARING RESTRICTIONS: No  FALLS:  Has patient fallen in last 6 months? Yes. Number of falls 2x Pt was falling down back steps at 2nd from bottom step. These past weekend, Saturday or Sunday fell getting off bed.   PATIENT GOALS: just want the pain to leave or calm down a little  NEXT  MD VISIT: March  OBJECTIVE:  Note: Objective measures were completed at Evaluation unless otherwise noted.  DIAGNOSTIC FINDINGS:    PATIENT SURVEYS:  Modified Oswestry 21/50=42%   COGNITION: Overall cognitive status: Within functional limits for tasks assessed     SENSATION: WFL  POSTURE: decreased lumbar lordosis and weight shift left  PALPATION: Right lumbar paraspinals-tender to touch  LUMBAR ROM:   AROM eval 01/26/24  Flexion 100%   Extension 75% and painful   Right lateral flexion  WFL  Left lateral flexion  Fingers to knee, painful coming back up  Right rotation  60% painful Lt LBP  Left rotation  75% pain free   (Blank rows = not tested)  LOWER EXTREMITY ROM:     Active  Right 01/26/24 Left Eval  Hip flexion    Hip extension    Hip abduction    Hip adduction    Hip internal rotation    Hip external rotation    Knee flexion 112   Knee extension 3   Ankle dorsiflexion    Ankle plantarflexion    Ankle inversion    Ankle eversion     (Blank rows = not tested)  LOWER EXTREMITY MMT:    MMT Right 01/26/24 Left 01/26/24  Hip flexion 5 4  Hip extension 4- 4-  Hip abduction 4 4-  Hip adduction    Hip internal rotation    Hip external rotation    Knee flexion 4+ 4  Knee extension 3+ 4  Ankle dorsiflexion    Ankle plantarflexion    Ankle inversion    Ankle eversion     (Blank rows = not tested)   FUNCTIONAL TESTS:  5 times sit to stand: 13 seconds   GAIT: Distance walked: 67ft Assistive device utilized: None Level of assistance: Complete Independence Comments: Antalgic gait pattern with limited swing pattern with LLE during stance of RLE  TREATMENT DATE:  01/28/24:  NuStep, seat 6, arm 8, level 1, > 70 SPM, 5' Seated hamstring stretch x 30 x 3 Supine post pelvic tilts x 3 x 10 x 2 Single knee to chest x 15 x 5 LTR x 1' Bridging x 5 x 10 x 2  01/26/24: Reviewed goals Educated importance of HEP compliance for maximal benefits MMT see  above Supine: TrA activation paired with exhalation to reduce holding breath (verbal and tactile cueing) Bridge with ab set 10x 3 ROM SLS Rt 7, LT 21  POE x 2 min  Prone press up 5x 10  Shown skeleton with educated on nerves and posture  01/17/2024 PT Evaluation  150/603 sitting    140/90 standing   HR 80 and irregularly.                                                                                                                        Evaluation terminated, pt with increased dizziness, poor capacity to keep eyes open. SpO2 worn and oxygen > 95% entire time with HR varying between 74-84bpm.   Pt was handed off to other staff for monitoring and SpO2 worn sitting in WC while sipping water .   PATIENT EDUCATION:  Education details: PT Evaluation, findings, prognosis, frequency, attendance policy, and HEP if given.  Person educated: Patient Education method: Medical Illustrator Education comprehension: verbalized understanding  HOME EXERCISE PROGRAM: Access Code: [email or web address] URL: [email or web address] 01/28/2024 - removed prone exercises - Seated Hamstring Stretch  - 1 x daily - 7 x weekly - 3 reps - 30 hold - Supine Posterior Pelvic Tilt  - 1 x daily - 7 x weekly - 2 sets - 10 reps - 3 hold - Hooklying Single Knee to Chest Stretch  - 1 x daily - 7 x weekly - 5 reps - 15 hold - Supine Lower Trunk Rotation  - 2 x daily - 7 x weekly  Date: 01/26/2024 Prepared by: Augustin Mclean  Exercises - Supine Transversus Abdominis Bracing - Hands on Stomach  - 2 x daily - 7 x weekly - 1 sets - 10 reps - 5 hold - Supine Bridge  - 2 x daily - 7 x weekly - 1 sets - 10 reps - 5 hold - Prone on Elbows Stretch  - 2 x daily - 7 x weekly - 1 sets - 1 reps - 120 hold - Prone Press Up  - 2 x daily - 7 x weekly - 1 sets - 5 reps - 10 hold  ASSESSMENT:  CLINICAL IMPRESSION: 01/28/24:  Interventions today were geared towards centralization of symptoms. Tolerated all  activities without worsening of symptoms. Pain centralized/localized on the low back on today's session. Demonstrated appropriate levels of fatigue. Provided slight amount of cueing to ensure correct execution of activity with good carry-over. At the start of session, BP = 122/82, SpO2 = 95%, HR = 91. At the end of session, BP = 125/72, SpO2 = 96%, HR = 94.To date, skilled PT is required to address the impairments and improve function.   Eval:  Patient is a 68y.o. female who was seen today for physical therapy evaluation and treatment for LOW BACK PAIN.  Evaluation was primary limited due to onset of anxiety like PTSD-like episode with increased nervousness.  Patient reporting this seems to be brought on via entering hospital-like rooms.  Based upon initial capacity to examine functional mobility and pain patient demonstrates a need for skilled physical therapy services to address low back pain due to reduced muscle weakness and related outcomes associated with pain  and disability.   OBJECTIVE IMPAIRMENTS: Abnormal gait, decreased activity tolerance, decreased balance, decreased ROM, decreased strength, impaired flexibility, postural dysfunction, and pain.   ACTIVITY LIMITATIONS: carrying, lifting, bending, sitting, squatting, sleeping, stairs, transfers, and locomotion level  PARTICIPATION LIMITATIONS: driving, community activity, occupation, and yard work  PERSONAL FACTORS: Age, Behavior pattern, and 3+ comorbidities: See chart above  are also affecting patient's functional outcome.   REHAB POTENTIAL: Good  CLINICAL DECISION MAKING: Stable/uncomplicated  EVALUATION COMPLEXITY: Moderate   GOALS: Goals reviewed with patient? No  SHORT TERM GOALS: Target date: 02/07/2024  Pt will be independent with HEP in order to demonstrate participation in Physical Therapy POC.  Baseline: Goal status: INITIAL  2.  Pt will report 5/10 pain with mobility in order to demonstrate improved pain with  functional activities.  Baseline:  Goal status: INITIAL  LONG TERM GOALS: Target date: 02/28/2024  Pt will improve 5x STS by <1 seconds in order to demonstrate improved functional strength to return to desired activities.  Baseline: See objective.  Goal status: INITIAL  2.  Pt will improve Modified Oswestry score by 10 points in order to demonstrate improved pain with functional goals and outcomes. Baseline: See objective.  Goal status: INITIAL  3.  Pt will report 3/10 pain with mobility in order to demonstrate reduced pain with overall functional status.  Baseline: See objective.  Goal status: INITIAL  4.  Pt will perform single leg balance bialterally > 10 seconds in order to demonstrate improved BLE strength and safety during dynamic gait activities.  Baseline: See objective.  Goal status: INITIAL  PLAN:  PT FREQUENCY: 2x/week  PT DURATION: 6 weeks  PLANNED INTERVENTIONS: 97164- PT Re-evaluation, 97110-Therapeutic exercises, 97530- Therapeutic activity, 97112- Neuromuscular re-education, 97535- Self Care, 02859- Manual therapy, [id number]- Gait training, [phone]- Orthotic Fit/training, 97014- Electrical stimulation (unattended), [phone]- Electrical stimulation (manual), [id number]- Traction (mechanical), Patient/Family education, Balance training, and Stair training.  PLAN FOR NEXT SESSION: Continue POC and may progress as tolerated with emphasis on progress core and proximal strengthening. Lumbar mobility.  Vinie CROME. Renley Banwart, PT, DPT, OCS Board-Certified Clinical Specialist in Orthopedic PT PT Compact Privilege # ([place]): [id number] T 01/28/2024, 11:13 AM

## 2024-02-01 ENCOUNTER — Encounter (HOSPITAL_COMMUNITY): Payer: 59

## 2024-02-01 ENCOUNTER — Encounter (HOSPITAL_COMMUNITY): Payer: Self-pay

## 2024-02-01 NOTE — Therapy (Incomplete)
OUTPATIENT PHYSICAL THERAPY THORACOLUMBAR TREATMENT   Patient Name: [name] MRN: [id number] DOB:09-26-1956, 68 y.o., female Today's Date: 02/01/2024  END OF SESSION:   Past Medical History:  Diagnosis Date   Anxiety    Arthritis    Asthma    Chronic knee pain    Diverticulitis    Dyspnea    with exertion   GERD (gastroesophageal reflux disease)    Headache    HSV infection    Hypertension    Low back pain 12/2023   Pneumonia    x several   Pre-diabetes    no meds, diet controlled   Seasonal allergies    Vertigo    Past Surgical History:  Procedure Laterality Date   ABDOMINAL HYSTERECTOMY     ABLATION COLPOCLESIS     CHOLECYSTECTOMY     COLONOSCOPY WITH PROPOFOL N/A 10/04/2017   Procedure: COLONOSCOPY WITH PROPOFOL;  Surgeon: Malissa Hippo, MD;  [facility];  Service: Endoscopy;  Laterality: N/A;  1:15   KNEE ARTHROSCOPY WITH LATERAL MENISECTOMY Right 04/29/2021   Procedure: RIGHT KNEE ARTHROSCOPY WITH LATERAL MENISCECTOMY;  Surgeon: Vickki Hearing, MD;  Location: [facility];  Service: Orthopedics;  Laterality: Right;   POLYPECTOMY  10/04/2017   Procedure: POLYPECTOMY;  Surgeon: Malissa Hippo, MD;  [facility]: [facility];  Service: Endoscopy;;  sigmoid colon polyp   TOOTH EXTRACTION N/A 01/21/2024   Procedure: DENTAL RESTORATION/EXTRACTIONS NUMBER 6, 7, 8, 9, 10, 11, 32, AND ALVEOLOPLASTY;  Surgeon: Ocie Doyne, DMD;  Location: [facility];  Service: Oral Surgery;  Laterality: N/A;   Patient Active Problem List   Diagnosis Date Noted   s/p right knee arthroscopy lateral meniscectomy 04/29/21 05/06/2021   Old complex tear of lateral meniscus of right knee    Diverticulitis of colon 09/15/2017   Diverticulitis 05/24/2017   AKI (acute kidney injury) (HCC) 05/24/2017   Hypokalemia 05/24/2017   Essential hypertension 05/24/2017   Vertigo 05/24/2017   CAP (community acquired pneumonia) 05/04/2015   Sepsis (HCC) 05/02/2015   Acute respiratory  failure (HCC) 05/02/2015    PCP: Gareth Morgan, MD  REFERRING PROVIDER: Lupita Raider NP  REFERRING DIAG: LOW BACK PAIN   Rationale for Evaluation and Treatment: Rehabilitation  THERAPY DIAG:  No diagnosis found.  ONSET DATE: > 2 years  SUBJECTIVE:                                                                                                                                                                                           SUBJECTIVE STATEMENT: ***.  PERTINENT HISTORY:    PAIN:  Are you having pain? Yes:  NPRS scale: 7/10 Pain location: middle of lumbar spine spanning across bilaterally into BLE.  Pain description: Pin and needles Aggravating factors: sitting down Relieving factors: standing up  PRECAUTIONS: None  RED FLAGS: None   WEIGHT BEARING RESTRICTIONS: No  FALLS:  Has patient fallen in last 6 months? Yes. Number of falls 2x Pt was falling down back steps at 2nd from bottom step. These past weekend, Saturday or Sunday fell getting off bed.   PATIENT GOALS: "just want the pain to leave or calm down a little"  NEXT MD VISIT: March  OBJECTIVE:  Note: Objective measures were completed at Evaluation unless otherwise noted.  DIAGNOSTIC FINDINGS:    PATIENT SURVEYS:  Modified Oswestry 21/50=42%   COGNITION: Overall cognitive status: Within functional limits for tasks assessed     SENSATION: WFL  POSTURE: decreased lumbar lordosis and weight shift left  PALPATION: Right lumbar paraspinals-tender to touch  LUMBAR ROM:   AROM eval 01/26/24  Flexion 100%   Extension 75% and painful   Right lateral flexion  WFL  Left lateral flexion  Fingers to knee, painful coming back up  Right rotation  60% painful Lt LBP  Left rotation  75% pain free   (Blank rows = not tested)  LOWER EXTREMITY ROM:     Active  Right 01/26/24 Left Eval  Hip flexion    Hip extension    Hip abduction    Hip adduction    Hip internal rotation    Hip external  rotation    Knee flexion 112   Knee extension 3   Ankle dorsiflexion    Ankle plantarflexion    Ankle inversion    Ankle eversion     (Blank rows = not tested)  LOWER EXTREMITY MMT:    MMT Right 01/26/24 Left 01/26/24  Hip flexion 5 4  Hip extension 4- 4-  Hip abduction 4 4-  Hip adduction    Hip internal rotation    Hip external rotation    Knee flexion 4+ 4  Knee extension 3+ 4  Ankle dorsiflexion    Ankle plantarflexion    Ankle inversion    Ankle eversion     (Blank rows = not tested)   FUNCTIONAL TESTS:  5 times sit to stand: 13 seconds   GAIT: Distance walked: 76ft Assistive device utilized: None Level of assistance: Complete Independence Comments: Antalgic gait pattern with limited swing pattern with LLE during stance of RLE  TREATMENT DATE:  02/01/2024  -*** -*** -*** -*** -***  01/28/24:  NuStep, seat 6, arm 8, level 1, > 70 SPM, 5' Seated hamstring stretch x 30" x 3 Supine post pelvic tilts x 3" x 10 x 2 Single knee to chest x 15" x 5 LTR x 1' Bridging x 5" x 10 x 2  01/26/24: Reviewed goals Educated importance of HEP compliance for maximal benefits MMT see above Supine: TrA activation paired with exhalation to reduce holding breath (verbal and tactile cueing) Bridge with ab set 10x 3" ROM SLS Rt 7", LT 21"  POE x 2 min Prone press up 5x 10"  Shown skeleton with educated on nerves and posture  PATIENT EDUCATION:  Education details: PT Evaluation, findings, prognosis, frequency, attendance policy, and HEP if given.  Person educated: Patient Education method: Medical illustrator Education comprehension: verbalized understanding  HOME EXERCISE PROGRAM: Access Code: [email or web address] URL: [email or web address] 01/28/2024 - removed prone exercises - Seated Hamstring Stretch  - 1 x daily - 7 x weekly - 3 reps -  30 hold - Supine Posterior Pelvic Tilt  - 1 x daily - 7 x weekly - 2 sets - 10 reps - 3 hold - Hooklying Single Knee  to Chest Stretch  - 1 x daily - 7 x weekly - 5 reps - 15 hold - Supine Lower Trunk Rotation  - 2 x daily - 7 x weekly  Date: 01/26/2024 Prepared by: Becky Sax  Exercises - Supine Transversus Abdominis Bracing - Hands on Stomach  - 2 x daily - 7 x weekly - 1 sets - 10 reps - 5" hold - Supine Bridge  - 2 x daily - 7 x weekly - 1 sets - 10 reps - 5" hold - Prone on Elbows Stretch  - 2 x daily - 7 x weekly - 1 sets - 1 reps - 120" hold - Prone Press Up  - 2 x daily - 7 x weekly - 1 sets - 5 reps - 10" hold  ASSESSMENT:  CLINICAL IMPRESSION: ***. To date, skilled PT is required to address the impairments and improve function.   Eval:  Patient is a 69y.o. female who was seen today for physical therapy evaluation and treatment for LOW BACK PAIN.  Evaluation was primary limited due to onset of anxiety like PTSD-like episode with increased nervousness.  Patient reporting this seems to be brought on via entering hospital-like rooms.  Based upon initial capacity to examine functional mobility and pain patient demonstrates a need for skilled physical therapy services to address low back pain due to reduced muscle weakness and related outcomes associated with pain and disability.   OBJECTIVE IMPAIRMENTS: Abnormal gait, decreased activity tolerance, decreased balance, decreased ROM, decreased strength, impaired flexibility, postural dysfunction, and pain.   ACTIVITY LIMITATIONS: carrying, lifting, bending, sitting, squatting, sleeping, stairs, transfers, and locomotion level  PARTICIPATION LIMITATIONS: driving, community activity, occupation, and yard work  PERSONAL FACTORS: Age, Behavior pattern, and 3+ comorbidities: See chart above  are also affecting patient's functional outcome.   REHAB POTENTIAL: Good  CLINICAL DECISION MAKING: Stable/uncomplicated  EVALUATION COMPLEXITY: Moderate   GOALS: Goals reviewed with patient? No  SHORT TERM GOALS: Target date: 02/07/2024  Pt will be  independent with HEP in order to demonstrate participation in Physical Therapy POC.  Baseline: Goal status: INITIAL  2.  Pt will report 5/10 pain with mobility in order to demonstrate improved pain with functional activities.  Baseline:  Goal status: INITIAL  LONG TERM GOALS: Target date: 02/28/2024  Pt will improve 5x STS by <1 seconds in order to demonstrate improved functional strength to return to desired activities.  Baseline: See objective.  Goal status: INITIAL  2.  Pt will improve Modified Oswestry score by 10 points in order to demonstrate improved pain with functional goals and outcomes. Baseline: See objective.  Goal status: INITIAL  3.  Pt will report 3/10 pain with mobility in order to demonstrate reduced pain with overall functional status.  Baseline: See objective.  Goal status: INITIAL  4.  Pt will perform single leg balance bialterally > 10 seconds in order to demonstrate improved BLE strength and safety during dynamic gait activities.  Baseline: See objective.  Goal status: INITIAL  PLAN:  PT FREQUENCY: 2x/week  PT DURATION: 6 weeks  PLANNED INTERVENTIONS: 97164- PT Re-evaluation, 97110-Therapeutic exercises, 97530- Therapeutic activity, 97112- Neuromuscular re-education, 97535- Self Care, 16109- Manual therapy, [id number]- Gait training, [phone]- Orthotic Fit/training, 97014- Electrical stimulation (unattended), [phone]- Electrical stimulation (manual), [id number]- Traction (mechanical), Patient/Family education, Balance training, and Stair training.  PLAN FOR  NEXT SESSION: Continue POC and may progress as tolerated with emphasis on progress core and proximal strengthening. Lumbar mobility.  Nelida Meuse PT, DPT [facility]- [place] [phone] office 02/01/2024, 7:51 AM

## 2024-02-01 NOTE — Therapy (Addendum)
[facility] Outpatient Rehabilitation at [facility] [address] [place], [place], [address] Phone: [phone]   Fax:  [phone]  Patient Details  Name: [name] MRN: [id number] Date of Birth: November 27, 1956 Referring Provider:  No ref. provider found  Encounter Date: 02/01/2024  Pt was called regarding # 1 no show. Pt was informed of next appointment session. Pt was also informed of No-show/attendance policy. Pt reporting that she forgot about it.  Nelida Meuse, PT 02/01/2024, 8:41 AM  [facility] Outpatient Rehabilitation at [facility] [address] [place], [place], [address] Phone: [phone]   Fax:  [phone]

## 2024-02-03 ENCOUNTER — Ambulatory Visit (HOSPITAL_COMMUNITY): Payer: 59

## 2024-02-03 ENCOUNTER — Encounter (HOSPITAL_COMMUNITY): Payer: Self-pay

## 2024-02-03 DIAGNOSIS — M545 Low back pain, unspecified: Secondary | ICD-10-CM

## 2024-02-03 DIAGNOSIS — R293 Abnormal posture: Secondary | ICD-10-CM

## 2024-02-03 DIAGNOSIS — M6281 Muscle weakness (generalized): Secondary | ICD-10-CM

## 2024-02-03 DIAGNOSIS — M79604 Pain in right leg: Secondary | ICD-10-CM | POA: Diagnosis not present

## 2024-02-03 NOTE — Therapy (Signed)
OUTPATIENT PHYSICAL THERAPY THORACOLUMBAR TREATMENT   Patient Name: [name] MRN: [id number] DOB:[birth year], 68 y.o., female Today's Date: 02/03/2024  END OF SESSION:  PT End of Session - 02/03/24 0924     Visit Number 4    Number of Visits 12    Date for PT Re-Evaluation 02/28/24    Authorization Type [employer] Dual Complete    Authorization Time Period no auth no limit    Progress Note Due on Visit 10    PT Start Time 0932    PT Stop Time 1013    PT Time Calculation (min) 41 min    Activity Tolerance Patient tolerated treatment well    Behavior During Therapy WFL for tasks assessed/performed            Past Medical History:  Diagnosis Date   Anxiety    Arthritis    Asthma    Chronic knee pain    Diverticulitis    Dyspnea    with exertion   GERD (gastroesophageal reflux disease)    Headache    HSV infection    Hypertension    Low back pain 12/2023   Pneumonia    x several   Pre-diabetes    no meds, diet controlled   Seasonal allergies    Vertigo    Past Surgical History:  Procedure Laterality Date   ABDOMINAL HYSTERECTOMY     ABLATION COLPOCLESIS     CHOLECYSTECTOMY     COLONOSCOPY WITH PROPOFOL N/A 10/04/2017   Procedure: COLONOSCOPY WITH PROPOFOL;  Surgeon: Malissa Hippo, MD;  [facility];  Service: Endoscopy;  Laterality: N/A;  1:15   KNEE ARTHROSCOPY WITH LATERAL MENISECTOMY Right 04/29/2021   Procedure: RIGHT KNEE ARTHROSCOPY WITH LATERAL MENISCECTOMY;  Surgeon: Vickki Hearing, MD;  Location: [facility];  Service: Orthopedics;  Laterality: Right;   POLYPECTOMY  10/04/2017   Procedure: POLYPECTOMY;  Surgeon: Malissa Hippo, MD;  [facility];  Service: Endoscopy;;  sigmoid colon polyp   TOOTH EXTRACTION N/A 01/21/2024   Procedure: DENTAL RESTORATION/EXTRACTIONS NUMBER 6, 7, 8, 9, 10, 11, 32, AND ALVEOLOPLASTY;  Surgeon: Ocie Doyne, DMD;  Location: [facility];  Service: Oral Surgery;  Laterality: N/A;   Patient Active Problem  List   Diagnosis Date Noted   s/p right knee arthroscopy lateral meniscectomy 04/29/21 05/06/2021   Old complex tear of lateral meniscus of right knee    Diverticulitis of colon 09/15/2017   Diverticulitis 05/24/2017   AKI (acute kidney injury) (HCC) 05/24/2017   Hypokalemia 05/24/2017   Essential hypertension 05/24/2017   Vertigo 05/24/2017   CAP (community acquired pneumonia) 05/04/2015   Sepsis (HCC) 05/02/2015   Acute respiratory failure (HCC) 05/02/2015    PCP: Gareth Morgan, MD  REFERRING PROVIDER: Lupita Raider NP  REFERRING DIAG: LOW BACK PAIN   Rationale for Evaluation and Treatment: Rehabilitation  THERAPY DIAG:  Low back pain, unspecified back pain laterality, unspecified chronicity, unspecified whether sciatica present  Muscle weakness (generalized)  Abnormal posture  ONSET DATE: > 2 years  SUBJECTIVE:  SUBJECTIVE STATEMENT: 02/03/24:  Reports pain is low today, maybe 1-2/10, no radicular symptoms today.  Has an ear ache today, on medication.  Eval:  Pt reporting chronic back paint hat has been receiving steroid shots in her low back. These shots were helping but a new provider wanted her to try Physical Therapy before another shot.   PERTINENT HISTORY:    PAIN:  Are you having pain? Yes: NPRS scale: 2/10 Pain location: middle of lumbar spine spanning across bilaterally into BLE.  Pain description: Pin and needles Aggravating factors: sitting down Relieving factors: standing up  PRECAUTIONS: None  RED FLAGS: None   WEIGHT BEARING RESTRICTIONS: No  FALLS:  Has patient fallen in last 6 months? Yes. Number of falls 2x Pt was falling down back steps at 2nd from bottom step. These past weekend, Saturday or Sunday fell getting off bed.   PATIENT GOALS: "just want the  pain to leave or calm down a little"  NEXT MD VISIT: March  OBJECTIVE:  Note: Objective measures were completed at Evaluation unless otherwise noted.  DIAGNOSTIC FINDINGS:    PATIENT SURVEYS:  Modified Oswestry 21/50=42%   COGNITION: Overall cognitive status: Within functional limits for tasks assessed     SENSATION: WFL  POSTURE: decreased lumbar lordosis and weight shift left  PALPATION: Right lumbar paraspinals-tender to touch  LUMBAR ROM:   AROM eval 01/26/24  Flexion 100%   Extension 75% and painful   Right lateral flexion  WFL  Left lateral flexion  Fingers to knee, painful coming back up  Right rotation  60% painful Lt LBP  Left rotation  75% pain free   (Blank rows = not tested)  LOWER EXTREMITY ROM:     Active  Right 01/26/24 Left Eval  Hip flexion    Hip extension    Hip abduction    Hip adduction    Hip internal rotation    Hip external rotation    Knee flexion 112   Knee extension 3   Ankle dorsiflexion    Ankle plantarflexion    Ankle inversion    Ankle eversion     (Blank rows = not tested)  LOWER EXTREMITY MMT:    MMT Right 01/26/24 Left 01/26/24  Hip flexion 5 4  Hip extension 4- 4-  Hip abduction 4 4-  Hip adduction    Hip internal rotation    Hip external rotation    Knee flexion 4+ 4  Knee extension 3+ 4  Ankle dorsiflexion    Ankle plantarflexion    Ankle inversion    Ankle eversion     (Blank rows = not tested)   FUNCTIONAL TESTS:  5 times sit to stand: 13 seconds   GAIT: Distance walked: 4ft Assistive device utilized: None Level of assistance: Complete Independence Comments: Antalgic gait pattern with limited swing pattern with LLE during stance of RLE  TREATMENT DATE:  02/03/24: NuStep, seat 6, arm 8, level 1, > 70 SPM, 5' Supine: Bridge with RTB around thigh 10x 5" 2 sets Posterior pelvic tilts 10x 3" x 2 sets Marching with RTB resistance with ab set 10x Hamstring stretch 30" Sidelying:  Clam with RTB 10x  5" Seated: seated posture with lumbar support Posterior pelvic tilt    01/28/24:  NuStep, seat 6, arm 8, level 1, > 70 SPM, 5' Seated hamstring stretch x 30" x 3 Supine post pelvic tilts x 3" x 10 x 2 Single knee to chest x 15" x 5 LTR x 1' Bridging x  5" x 10 x 2  01/26/24: Reviewed goals Educated importance of HEP compliance for maximal benefits MMT see above Supine: TrA activation paired with exhalation to reduce holding breath (verbal and tactile cueing) Bridge with ab set 10x 3" ROM SLS Rt 7", LT 21"  POE x 2 min Prone press up 5x 10"  Shown skeleton with educated on nerves and posture  01/17/2024 PT Evaluation  150/603 sitting    140/90 standing   HR 80 and irregularly.                                                                                                                        Evaluation terminated, pt with increased dizziness, poor capacity to keep eyes open. SpO2 worn and oxygen > 95% entire time with HR varying between 74-84bpm.   Pt was handed off to other staff for monitoring and SpO2 worn sitting in WC while sipping water.   PATIENT EDUCATION:  Education details: PT Evaluation, findings, prognosis, frequency, attendance policy, and HEP if given.  Person educated: Patient Education method: Medical illustrator Education comprehension: verbalized understanding  HOME EXERCISE PROGRAM: Access Code: [email or web address] URL: [email or web address] 01/28/2024 - removed prone exercises - Seated Hamstring Stretch  - 1 x daily - 7 x weekly - 3 reps - 30 hold - Supine Posterior Pelvic Tilt  - 1 x daily - 7 x weekly - 2 sets - 10 reps - 3 hold - Hooklying Single Knee to Chest Stretch  - 1 x daily - 7 x weekly - 5 reps - 15 hold - Supine Lower Trunk Rotation  - 2 x daily - 7 x weekly  Date: 01/26/2024 Prepared by: Becky Sax  Exercises - Supine Transversus Abdominis Bracing - Hands on Stomach  - 2 x daily - 7 x weekly - 1 sets - 10 reps -  5" hold - Supine Bridge  - 2 x daily - 7 x weekly - 1 sets - 10 reps - 5" hold - Prone on Elbows Stretch  - 2 x daily - 7 x weekly - 1 sets - 1 reps - 120" hold - Prone Press Up  - 2 x daily - 7 x weekly - 1 sets - 5 reps - 10" hold  ASSESSMENT:  CLINICAL IMPRESSION: 02/03/24:  Session foucs iwht core and proximal strengthening.  NMR to improve TrA activation to improve pelvic rotation with demonstration, verbal and tactile cueing with improved technqiues following cueing.  Educated on seated posture for pain control with additional lumbar support and pelvic rotation in seated position with reports of pain reduced with exercise.  Added seated posterior pelvic rotation and clam to HEP with handout given and RTB.  Pt will continue to benefit from core strengthening and flexion based exercises.     Eval:  Patient is a 68y.o. female who was seen today for physical therapy evaluation and treatment for LOW BACK PAIN.  Evaluation was primary limited due to onset of anxiety like PTSD-like episode with  increased nervousness.  Patient reporting this seems to be brought on via entering hospital-like rooms.  Based upon initial capacity to examine functional mobility and pain patient demonstrates a need for skilled physical therapy services to address low back pain due to reduced muscle weakness and related outcomes associated with pain and disability.   OBJECTIVE IMPAIRMENTS: Abnormal gait, decreased activity tolerance, decreased balance, decreased ROM, decreased strength, impaired flexibility, postural dysfunction, and pain.   ACTIVITY LIMITATIONS: carrying, lifting, bending, sitting, squatting, sleeping, stairs, transfers, and locomotion level  PARTICIPATION LIMITATIONS: driving, community activity, occupation, and yard work  PERSONAL FACTORS: Age, Behavior pattern, and 3+ comorbidities: See chart above  are also affecting patient's functional outcome.   REHAB POTENTIAL: Good  CLINICAL DECISION MAKING:  Stable/uncomplicated  EVALUATION COMPLEXITY: Moderate   GOALS: Goals reviewed with patient? No  SHORT TERM GOALS: Target date: 02/07/2024  Pt will be independent with HEP in order to demonstrate participation in Physical Therapy POC.  Baseline: Goal status: INITIAL  2.  Pt will report 5/10 pain with mobility in order to demonstrate improved pain with functional activities.  Baseline:  Goal status: INITIAL  LONG TERM GOALS: Target date: 02/28/2024  Pt will improve 5x STS by <1 seconds in order to demonstrate improved functional strength to return to desired activities.  Baseline: See objective.  Goal status: INITIAL  2.  Pt will improve Modified Oswestry score by 10 points in order to demonstrate improved pain with functional goals and outcomes. Baseline: See objective.  Goal status: INITIAL  3.  Pt will report 3/10 pain with mobility in order to demonstrate reduced pain with overall functional status.  Baseline: See objective.  Goal status: INITIAL  4.  Pt will perform single leg balance bialterally > 10 seconds in order to demonstrate improved BLE strength and safety during dynamic gait activities.  Baseline: See objective.  Goal status: INITIAL  PLAN:  PT FREQUENCY: 2x/week  PT DURATION: 6 weeks  PLANNED INTERVENTIONS: 97164- PT Re-evaluation, 97110-Therapeutic exercises, 97530- Therapeutic activity, 97112- Neuromuscular re-education, 97535- Self Care, 40981- Manual therapy, [id number]- Gait training, [phone]- Orthotic Fit/training, 97014- Electrical stimulation (unattended), [id number]- Electrical stimulation (manual), [id number]- Traction (mechanical), Patient/Family education, Balance training, and Stair training.  PLAN FOR NEXT SESSION: Continue POC and may progress as tolerated with emphasis on progress core and proximal strengthening. Lumbar mobility.  Continue flexion based exercises and training to improve core stability and posterior pelvic tilt.  Becky Sax, LPTA/CLT;  Rowe Clack [phone]  02/03/2024, 10:23 AM

## 2024-02-08 ENCOUNTER — Encounter (HOSPITAL_COMMUNITY): Payer: Self-pay

## 2024-02-08 ENCOUNTER — Ambulatory Visit (HOSPITAL_COMMUNITY): Payer: 59

## 2024-02-08 DIAGNOSIS — M6281 Muscle weakness (generalized): Secondary | ICD-10-CM

## 2024-02-08 DIAGNOSIS — M79604 Pain in right leg: Secondary | ICD-10-CM | POA: Diagnosis not present

## 2024-02-08 DIAGNOSIS — R293 Abnormal posture: Secondary | ICD-10-CM

## 2024-02-08 DIAGNOSIS — M545 Low back pain, unspecified: Secondary | ICD-10-CM

## 2024-02-08 NOTE — Therapy (Signed)
OUTPATIENT PHYSICAL THERAPY THORACOLUMBAR TREATMENT   Patient Name: [name] MRN: [id number] DOB:05/21/1956, 68 y.o., female Today's Date: 02/08/2024  END OF SESSION:  PT End of Session - 02/08/24 0842     Visit Number 5    Number of Visits 12    Date for PT Re-Evaluation 02/28/24    Authorization Type U[employer] Dual Complete    Authorization Time Period no auth no limit    Progress Note Due on Visit 10    PT Start Time 0804    PT Stop Time 0843    PT Time Calculation (min) 39 min    Activity Tolerance Patient tolerated treatment well    Behavior During Therapy [facility] for tasks assessed/performed             Past Medical History:  Diagnosis Date   Anxiety    Arthritis    Asthma    Chronic knee pain    Diverticulitis    Dyspnea    with exertion   GERD (gastroesophageal reflux disease)    Headache    HSV infection    Hypertension    Low back pain 12/2023   Pneumonia    x several   Pre-diabetes    no meds, diet controlled   Seasonal allergies    Vertigo    Past Surgical History:  Procedure Laterality Date   ABDOMINAL HYSTERECTOMY     ABLATION COLPOCLESIS     CHOLECYSTECTOMY     COLONOSCOPY WITH PROPOFOL N/A 10/04/2017   Procedure: COLONOSCOPY WITH PROPOFOL;  Surgeon: Malissa Hippo, MD;  [facility];  Service: Endoscopy;  Laterality: N/A;  1:15   KNEE ARTHROSCOPY WITH LATERAL MENISECTOMY Right 04/29/2021   Procedure: RIGHT KNEE ARTHROSCOPY WITH LATERAL MENISCECTOMY;  Surgeon: Vickki Hearing, MD;  Location: [facility];  Service: Orthopedics;  Laterality: Right;   POLYPECTOMY  10/04/2017   Procedure: POLYPECTOMY;  Surgeon: Malissa Hippo, MD;  Location: [facility];  Service: Endoscopy;;  sigmoid colon polyp   TOOTH EXTRACTION N/A 01/21/2024   Procedure: DENTAL RESTORATION/EXTRACTIONS NUMBER 6, 7, 8, 9, 10, 11, 32, AND ALVEOLOPLASTY;  Surgeon: Ocie Doyne, DMD;  Location: [facility] OR;  Service: Oral Surgery;  Laterality: N/A;   Patient Active  Problem List   Diagnosis Date Noted   s/p right knee arthroscopy lateral meniscectomy 04/29/21 05/06/2021   Old complex tear of lateral meniscus of right knee    Diverticulitis of colon 09/15/2017   Diverticulitis 05/24/2017   AKI (acute kidney injury) (HCC) 05/24/2017   Hypokalemia 05/24/2017   Essential hypertension 05/24/2017   Vertigo 05/24/2017   CAP (community acquired pneumonia) 05/04/2015   Sepsis (HCC) 05/02/2015   Acute respiratory failure (HCC) 05/02/2015    PCP: Gareth Morgan, MD  REFERRING PROVIDER: Lupita Raider NP  REFERRING DIAG: LOW BACK PAIN   Rationale for Evaluation and Treatment: Rehabilitation  THERAPY DIAG:  Low back pain, unspecified back pain laterality, unspecified chronicity, unspecified whether sciatica present  Muscle weakness (generalized)  Abnormal posture  ONSET DATE: > 2 years  SUBJECTIVE:  SUBJECTIVE STATEMENT: Pt reporting she just had her wisdom teeth cut out recently. She reports 1/10 low back pain.  Eval:  Pt reporting chronic back paint hat has been receiving steroid shots in her low back. These shots were helping but a new provider wanted her to try Physical Therapy before another shot.   PERTINENT HISTORY:    PAIN:  Are you having pain? Yes: NPRS scale: 2/10 Pain location: middle of lumbar spine spanning across bilaterally into BLE.  Pain description: Pin and needles Aggravating factors: sitting down Relieving factors: standing up  PRECAUTIONS: None  RED FLAGS: None   WEIGHT BEARING RESTRICTIONS: No  FALLS:  Has patient fallen in last 6 months? Yes. Number of falls 2x Pt was falling down back steps at 2nd from bottom step. These past weekend, Saturday or Sunday fell getting off bed.   PATIENT GOALS: "just want the pain to leave or  calm down a little"  NEXT MD VISIT: March  OBJECTIVE:  Note: Objective measures were completed at Evaluation unless otherwise noted.  DIAGNOSTIC FINDINGS:    PATIENT SURVEYS:  Modified Oswestry 21/50=42%   COGNITION: Overall cognitive status: Within functional limits for tasks assessed     SENSATION: WFL  POSTURE: decreased lumbar lordosis and weight shift left  PALPATION: Right lumbar paraspinals-tender to touch  LUMBAR ROM:   AROM eval 01/26/24  Flexion 100%   Extension 75% and painful   Right lateral flexion  WFL  Left lateral flexion  Fingers to knee, painful coming back up  Right rotation  60% painful Lt LBP  Left rotation  75% pain free   (Blank rows = not tested)  LOWER EXTREMITY ROM:     Active  Right 01/26/24 Left Eval  Hip flexion    Hip extension    Hip abduction    Hip adduction    Hip internal rotation    Hip external rotation    Knee flexion 112   Knee extension 3   Ankle dorsiflexion    Ankle plantarflexion    Ankle inversion    Ankle eversion     (Blank rows = not tested)  LOWER EXTREMITY MMT:    MMT Right 01/26/24 Left 01/26/24  Hip flexion 5 4  Hip extension 4- 4-  Hip abduction 4 4-  Hip adduction    Hip internal rotation    Hip external rotation    Knee flexion 4+ 4  Knee extension 3+ 4  Ankle dorsiflexion    Ankle plantarflexion    Ankle inversion    Ankle eversion     (Blank rows = not tested)   FUNCTIONAL TESTS:  5 times sit to stand: 13 seconds   GAIT: Distance walked: 56ft Assistive device utilized: None Level of assistance: Complete Independence Comments: Antalgic gait pattern with limited swing pattern with LLE during stance of RLE  TREATMENT DATE:  02/08/2024  -Supine bridges 10x10' -Supine marching with GTB at ankles 10x5' -Side lying clamshells 2 x 12 with GTB w/ 3'' -Seated Hamstring stretch 2 x30'' -Theraball posterior pelvic tilts -theraball sitting anti-rotation press x 10 bilaterally with  RTB -Attempted seated marching on theraball with UE support on theraband  02/03/24: NuStep, seat 6, arm 8, level 1, > 70 SPM, 5' Supine: Bridge with RTB around thigh 10x 5" 2 sets Posterior pelvic tilts 10x 3" x 2 sets Marching with RTB resistance with ab set 10x Hamstring stretch 30" Sidelying:  Clam with RTB 10x 5" Seated: seated posture with lumbar support Posterior pelvic  tilt   01/28/24:  NuStep, seat 6, arm 8, level 1, > 70 SPM, 5' Seated hamstring stretch x 30" x 3 Supine post pelvic tilts x 3" x 10 x 2 Single knee to chest x 15" x 5 LTR x 1' Bridging x 5" x 10 x 2    PATIENT EDUCATION:  Education details: PT Evaluation, findings, prognosis, frequency, attendance policy, and HEP if given.  Person educated: Patient Education method: Medical illustrator Education comprehension: verbalized understanding  HOME EXERCISE PROGRAM: Access Code: [email or web address] URL: [email or web address] 01/28/2024 - removed prone exercises - Seated Hamstring Stretch  - 1 x daily - 7 x weekly - 3 reps - 30 hold - Supine Posterior Pelvic Tilt  - 1 x daily - 7 x weekly - 2 sets - 10 reps - 3 hold - Hooklying Single Knee to Chest Stretch  - 1 x daily - 7 x weekly - 5 reps - 15 hold - Supine Lower Trunk Rotation  - 2 x daily - 7 x weekly  Date: 01/26/2024 Prepared by: Becky Sax  Exercises - Supine Transversus Abdominis Bracing - Hands on Stomach  - 2 x daily - 7 x weekly - 1 sets - 10 reps - 5" hold - Supine Bridge  - 2 x daily - 7 x weekly - 1 sets - 10 reps - 5" hold - Prone on Elbows Stretch  - 2 x daily - 7 x weekly - 1 sets - 1 reps - 120" hold - Prone Press Up  - 2 x daily - 7 x weekly - 1 sets - 5 reps - 10" hold  ASSESSMENT:  CLINICAL IMPRESSION: Pt tolerating therapy session well. Progressing activities well with increasing strength and endurance in hip musculature. Showing improved proprioception of pelvic tilting during session. Added anti-rotation  training for increased core stability. Pain at EOS is: 0/10  Pt will continue to benefit from core strengthening and flexion based exercises.     OBJECTIVE IMPAIRMENTS: Abnormal gait, decreased activity tolerance, decreased balance, decreased ROM, decreased strength, impaired flexibility, postural dysfunction, and pain.   ACTIVITY LIMITATIONS: carrying, lifting, bending, sitting, squatting, sleeping, stairs, transfers, and locomotion level  PARTICIPATION LIMITATIONS: driving, community activity, occupation, and yard work  PERSONAL FACTORS: Age, Behavior pattern, and 3+ comorbidities: See chart above  are also affecting patient's functional outcome.   REHAB POTENTIAL: Good  CLINICAL DECISION MAKING: Stable/uncomplicated  EVALUATION COMPLEXITY: Moderate   GOALS: Goals reviewed with patient? No  SHORT TERM GOALS: Target date: 02/07/2024  Pt will be independent with HEP in order to demonstrate participation in Physical Therapy POC.  Baseline: Goal status: INITIAL  2.  Pt will report 5/10 pain with mobility in order to demonstrate improved pain with functional activities.  Baseline:  Goal status: INITIAL  LONG TERM GOALS: Target date: 02/28/2024  Pt will improve 5x STS by <1 seconds in order to demonstrate improved functional strength to return to desired activities.  Baseline: See objective.  Goal status: INITIAL  2.  Pt will improve Modified Oswestry score by 10 points in order to demonstrate improved pain with functional goals and outcomes. Baseline: See objective.  Goal status: INITIAL  3.  Pt will report 3/10 pain with mobility in order to demonstrate reduced pain with overall functional status.  Baseline: See objective.  Goal status: INITIAL  4.  Pt will perform single leg balance bialterally > 10 seconds in order to demonstrate improved BLE strength and safety during dynamic gait activities.  Baseline: See objective.  Goal  status: INITIAL  PLAN:  PT FREQUENCY:  2x/week  PT DURATION: 6 weeks  PLANNED INTERVENTIONS: 97164- PT Re-evaluation, 97110-Therapeutic exercises, 97530- Therapeutic activity, 97112- Neuromuscular re-education, 97535- Self Care, 29562- Manual therapy, [id number]- Gait training, [phone]- Orthotic Fit/training, 97014- Electrical stimulation (unattended), [phone]- Electrical stimulation (manual), [id number]- Traction (mechanical), Patient/Family education, Balance training, and Stair training.  PLAN FOR NEXT SESSION: Continue POC and may progress as tolerated with emphasis on progress core and proximal strengthening. Lumbar mobility.  Continue flexion based exercises and training to improve core stability and posterior pelvic tilt.  Nelida Meuse PT, DPT [facility] [place] [phone] office  02/08/2024, 8:43 AM

## 2024-02-09 ENCOUNTER — Ambulatory Visit (HOSPITAL_COMMUNITY): Payer: 59

## 2024-02-09 DIAGNOSIS — M6281 Muscle weakness (generalized): Secondary | ICD-10-CM

## 2024-02-09 DIAGNOSIS — M79604 Pain in right leg: Secondary | ICD-10-CM | POA: Diagnosis not present

## 2024-02-09 DIAGNOSIS — M545 Low back pain, unspecified: Secondary | ICD-10-CM

## 2024-02-09 DIAGNOSIS — R293 Abnormal posture: Secondary | ICD-10-CM | POA: Diagnosis not present

## 2024-02-09 NOTE — Therapy (Signed)
OUTPATIENT PHYSICAL THERAPY THORACOLUMBAR TREATMENT   Patient Name: RUBEN PYKA MRN: [id number] DOB:[birth year], 68 y.o., female Today's Date: 02/09/2024  END OF SESSION:  PT End of Session - 02/09/24 1106     Visit Number 6    Number of Visits 12    Date for PT Re-Evaluation 02/28/24    Authorization Type UHC Dual Complete    Authorization Time Period no auth no limit    PT Start Time 1100    PT Stop Time 1140    PT Time Calculation (min) 40 min    Activity Tolerance Patient tolerated treatment well    Behavior During Therapy WFL for tasks assessed/performed            Past Medical History:  Diagnosis Date   Anxiety    Arthritis    Asthma    Chronic knee pain    Diverticulitis    Dyspnea    with exertion   GERD (gastroesophageal reflux disease)    Headache    HSV infection    Hypertension    Low back pain 12/2023   Pneumonia    x several   Pre-diabetes    no meds, diet controlled   Seasonal allergies    Vertigo    Past Surgical History:  Procedure Laterality Date   ABDOMINAL HYSTERECTOMY     ABLATION COLPOCLESIS     CHOLECYSTECTOMY     COLONOSCOPY WITH PROPOFOL N/A 10/04/2017   Procedure: COLONOSCOPY WITH PROPOFOL;  Surgeon: Malissa Hippo, MD;  [facility]: [facility];  Service: Endoscopy;  Laterality: N/A;  1:15   KNEE ARTHROSCOPY WITH LATERAL MENISECTOMY Right 04/29/2021   Procedure: RIGHT KNEE ARTHROSCOPY WITH LATERAL MENISCECTOMY;  Surgeon: Vickki Hearing, MD;  Location: [facility];  Service: Orthopedics;  Laterality: Right;   POLYPECTOMY  10/04/2017   Procedure: POLYPECTOMY;  Surgeon: Malissa Hippo, MD;  Location: [facility];  Service: Endoscopy;;  sigmoid colon polyp   TOOTH EXTRACTION N/A 01/21/2024   Procedure: DENTAL RESTORATION/EXTRACTIONS NUMBER 6, 7, 8, 9, 10, 11, 32, AND ALVEOLOPLASTY;  Surgeon: Ocie Doyne, DMD;  Location: [facility];  Service: Oral Surgery;  Laterality: N/A;   Patient Active Problem List   Diagnosis Date Noted    s/p right knee arthroscopy lateral meniscectomy 04/29/21 05/06/2021   Old complex tear of lateral meniscus of right knee    Diverticulitis of colon 09/15/2017   Diverticulitis 05/24/2017   AKI (acute kidney injury) (HCC) 05/24/2017   Hypokalemia 05/24/2017   Essential hypertension 05/24/2017   Vertigo 05/24/2017   CAP (community acquired pneumonia) 05/04/2015   Sepsis (HCC) 05/02/2015   Acute respiratory failure (HCC) 05/02/2015    PCP: Gareth Morgan, MD  REFERRING PROVIDER: Lupita Raider NP  REFERRING DIAG: LOW BACK PAIN   Rationale for Evaluation and Treatment: Rehabilitation  THERAPY DIAG:  No diagnosis found.  ONSET DATE: > 2 years  SUBJECTIVE:  SUBJECTIVE STATEMENT: Denies shooting pain down the legs and the legs are doing well. Patient reports of low back pain = 4/10.  Eval:  Pt reporting chronic back paint hat has been receiving steroid shots in her low back. These shots were helping but a new provider wanted her to try Physical Therapy before another shot.   PERTINENT HISTORY:    PAIN:  Are you having pain? Yes: NPRS scale: 2/10 Pain location: middle of lumbar spine spanning across bilaterally into BLE.  Pain description: Pin and needles Aggravating factors: sitting down Relieving factors: standing up  PRECAUTIONS: None  RED FLAGS: None   WEIGHT BEARING RESTRICTIONS: No  FALLS:  Has patient fallen in last 6 months? Yes. Number of falls 2x Pt was falling down back steps at 2nd from bottom step. These past weekend, Saturday or Sunday fell getting off bed.   PATIENT GOALS: "just want the pain to leave or calm down a little"  NEXT MD VISIT: March  OBJECTIVE:  Note: Objective measures were completed at Evaluation unless otherwise noted.  DIAGNOSTIC FINDINGS:     PATIENT SURVEYS:  Modified Oswestry 21/50=42%   COGNITION: Overall cognitive status: Within functional limits for tasks assessed     SENSATION: WFL  POSTURE: decreased lumbar lordosis and weight shift left  PALPATION: Right lumbar paraspinals-tender to touch  LUMBAR ROM:   AROM eval 01/26/24  Flexion 100%   Extension 75% and painful   Right lateral flexion  WFL  Left lateral flexion  Fingers to knee, painful coming back up  Right rotation  60% painful Lt LBP  Left rotation  75% pain free   (Blank rows = not tested)  LOWER EXTREMITY ROM:     Active  Right 01/26/24 Left Eval  Hip flexion    Hip extension    Hip abduction    Hip adduction    Hip internal rotation    Hip external rotation    Knee flexion 112   Knee extension 3   Ankle dorsiflexion    Ankle plantarflexion    Ankle inversion    Ankle eversion     (Blank rows = not tested)  LOWER EXTREMITY MMT:    MMT Right 01/26/24 Left 01/26/24  Hip flexion 5 4  Hip extension 4- 4-  Hip abduction 4 4-  Hip adduction    Hip internal rotation    Hip external rotation    Knee flexion 4+ 4  Knee extension 3+ 4  Ankle dorsiflexion    Ankle plantarflexion    Ankle inversion    Ankle eversion     (Blank rows = not tested)   FUNCTIONAL TESTS:  5 times sit to stand: 13 seconds   GAIT: Distance walked: 41ft Assistive device utilized: None Level of assistance: Complete Independence Comments: Antalgic gait pattern with limited swing pattern with LLE during stance of RLE  TREATMENT DATE:  02/09/24 NuStep, seat 6, arm 8, level 2, > 70 SPM, 5' Seated hamstring stretch x 30" x 3 Seated piriformis stretch x 30" x 2 Supine bridges with hip abd, RTB x 3" x 10 x 2 Double knee to chest with physioball x 5" x 10  Seated on a physioball, neutral spine:  Palloff press, RTB x 10 x 2 on each  Trunk rotation with med ball on chest x 10 on each   02/08/2024  -Supine bridges 10x10' -Supine marching with GTB at ankles  10x5' -Side lying clamshells 2 x 12 with GTB w/ 3'' -Seated Hamstring stretch  2 x30'' -Theraball posterior pelvic tilts -theraball sitting anti-rotation press x 10 bilaterally with RTB -Attempted seated marching on theraball with UE support on theraband  02/03/24: NuStep, seat 6, arm 8, level 1, > 70 SPM, 5' Supine: Bridge with RTB around thigh 10x 5" 2 sets Posterior pelvic tilts 10x 3" x 2 sets Marching with RTB resistance with ab set 10x Hamstring stretch 30" Sidelying:  Clam with RTB 10x 5" Seated: seated posture with lumbar support Posterior pelvic tilt   01/28/24:  NuStep, seat 6, arm 8, level 1, > 70 SPM, 5' Seated hamstring stretch x 30" x 3 Supine post pelvic tilts x 3" x 10 x 2 Single knee to chest x 15" x 5 LTR x 1' Bridging x 5" x 10 x 2    PATIENT EDUCATION:  Education details: PT Evaluation, findings, prognosis, frequency, attendance policy, and HEP if given.  Person educated: Patient Education method: Medical illustrator Education comprehension: verbalized understanding  HOME EXERCISE PROGRAM: Access Code: [email or web address] URL: [email or web address] 02/09/2024 - Seated Piriformis Stretch  - 1-2 x daily - 7 x weekly - 3 reps - 30 hold  01/28/2024 - removed prone exercises - Seated Hamstring Stretch  - 1 x daily - 7 x weekly - 3 reps - 30 hold - Supine Posterior Pelvic Tilt  - 1 x daily - 7 x weekly - 2 sets - 10 reps - 3 hold - Hooklying Single Knee to Chest Stretch  - 1 x daily - 7 x weekly - 5 reps - 15 hold - Supine Lower Trunk Rotation  - 2 x daily - 7 x weekly  Date: 01/26/2024 Prepared by: Becky Sax  Exercises - Supine Transversus Abdominis Bracing - Hands on Stomach  - 2 x daily - 7 x weekly - 1 sets - 10 reps - 5" hold - Supine Bridge  - 2 x daily - 7 x weekly - 1 sets - 10 reps - 5" hold - Prone on Elbows Stretch  - 2 x daily - 7 x weekly - 1 sets - 1 reps - 120" hold - Prone Press Up  - 2 x daily - 7 x weekly - 1 sets  - 5 reps - 10" hold  ASSESSMENT:  CLINICAL IMPRESSION: Interventions today were geared towards centralization of symptoms and core stabilization. Tolerated all activities without worsening of symptoms. No radicular symptoms reported. Demonstrated appropriate levels of fatigue. Provided slight amount of cueing to ensure correct execution of activity with good carry-over. Pacing of activities was slightly slow.To date, skilled PT is required to address the impairments and improve function.    OBJECTIVE IMPAIRMENTS: Abnormal gait, decreased activity tolerance, decreased balance, decreased ROM, decreased strength, impaired flexibility, postural dysfunction, and pain.   ACTIVITY LIMITATIONS: carrying, lifting, bending, sitting, squatting, sleeping, stairs, transfers, and locomotion level  PARTICIPATION LIMITATIONS: driving, community activity, occupation, and yard work  PERSONAL FACTORS: Age, Behavior pattern, and 3+ comorbidities: See chart above  are also affecting patient's functional outcome.   REHAB POTENTIAL: Good  CLINICAL DECISION MAKING: Stable/uncomplicated  EVALUATION COMPLEXITY: Moderate   GOALS: Goals reviewed with patient? No  SHORT TERM GOALS: Target date: 02/07/2024  Pt will be independent with HEP in order to demonstrate participation in Physical Therapy POC.  Baseline: Goal status: INITIAL  2.  Pt will report 5/10 pain with mobility in order to demonstrate improved pain with functional activities.  Baseline:  Goal status: INITIAL  LONG TERM GOALS: Target date: 02/28/2024  Pt will improve 5x STS by <  1 seconds in order to demonstrate improved functional strength to return to desired activities.  Baseline: See objective.  Goal status: INITIAL  2.  Pt will improve Modified Oswestry score by 10 points in order to demonstrate improved pain with functional goals and outcomes. Baseline: See objective.  Goal status: INITIAL  3.  Pt will report 3/10 pain with mobility  in order to demonstrate reduced pain with overall functional status.  Baseline: See objective.  Goal status: INITIAL  4.  Pt will perform single leg balance bialterally > 10 seconds in order to demonstrate improved BLE strength and safety during dynamic gait activities.  Baseline: See objective.  Goal status: INITIAL  PLAN:  PT FREQUENCY: 2x/week  PT DURATION: 6 weeks  PLANNED INTERVENTIONS: 97164- PT Re-evaluation, 97110-Therapeutic exercises, 97530- Therapeutic activity, 97112- Neuromuscular re-education, 97535- Self Care, 30865- Manual therapy, [id number]- Gait training, [phone]- Orthotic Fit/training, 97014- Electrical stimulation (unattended), [phone]- Electrical stimulation (manual), [id number]- Traction (mechanical), Patient/Family education, Balance training, and Stair training.  PLAN FOR NEXT SESSION: Continue POC and may progress as tolerated with emphasis on progress core and proximal strengthening. Lumbar mobility.  Continue flexion based exercises and training to improve core stability and posterior pelvic tilt.  Tish Frederickson. Cullan Launer, PT, DPT, OCS Board-Certified Clinical Specialist in Orthopedic PT PT Compact Privilege # ([place]): [id number] T 02/09/2024, 11:06 AM

## 2024-02-10 ENCOUNTER — Encounter (HOSPITAL_COMMUNITY): Payer: 59

## 2024-02-15 ENCOUNTER — Encounter (HOSPITAL_COMMUNITY): Payer: Self-pay

## 2024-02-15 ENCOUNTER — Ambulatory Visit (HOSPITAL_COMMUNITY): Payer: 59

## 2024-02-15 DIAGNOSIS — R293 Abnormal posture: Secondary | ICD-10-CM

## 2024-02-15 DIAGNOSIS — M6281 Muscle weakness (generalized): Secondary | ICD-10-CM | POA: Diagnosis not present

## 2024-02-15 DIAGNOSIS — M545 Low back pain, unspecified: Secondary | ICD-10-CM

## 2024-02-15 DIAGNOSIS — M79604 Pain in right leg: Secondary | ICD-10-CM | POA: Diagnosis not present

## 2024-02-15 NOTE — Therapy (Addendum)
 OUTPATIENT PHYSICAL THERAPY THORACOLUMBAR TREATMENT   Patient Name: [name] MRN: [id number] DOB:[birth year], 68 y.o., female Today's Date: 02/15/2024  END OF SESSION:  PT End of Session - 02/15/24 0928     Visit Number 7    Number of Visits 12    Date for PT Re-Evaluation 02/28/24    Authorization Type [employer] Dual Complete    Authorization Time Period no auth no limit    Progress Note Due on Visit 10    PT Start Time 0930    PT Stop Time 1014    PT Time Calculation (min) 44 min    Activity Tolerance Patient tolerated treatment well;No increased pain    Behavior During Therapy WFL for tasks assessed/performed            Past Medical History:  Diagnosis Date   Anxiety    Arthritis    Asthma    Chronic knee pain    Diverticulitis    Dyspnea    with exertion   GERD (gastroesophageal reflux disease)    Headache    HSV infection    Hypertension    Low back pain 12/2023   Pneumonia    x several   Pre-diabetes    no meds, diet controlled   Seasonal allergies    Vertigo    Past Surgical History:  Procedure Laterality Date   ABDOMINAL HYSTERECTOMY     ABLATION COLPOCLESIS     CHOLECYSTECTOMY     COLONOSCOPY WITH PROPOFOL N/A 10/04/2017   Procedure: COLONOSCOPY WITH PROPOFOL;  Surgeon: Malissa Hippo, MD;  [facility]: [facility];  Service: Endoscopy;  Laterality: N/A;  1:15   KNEE ARTHROSCOPY WITH LATERAL MENISECTOMY Right 04/29/2021   Procedure: RIGHT KNEE ARTHROSCOPY WITH LATERAL MENISCECTOMY;  Surgeon: Vickki Hearing, MD;  Location: [facility];  Service: Orthopedics;  Laterality: Right;   POLYPECTOMY  10/04/2017   Procedure: POLYPECTOMY;  Surgeon: Malissa Hippo, MD;  [facility]: [facility];  Service: Endoscopy;;  sigmoid colon polyp   TOOTH EXTRACTION N/A 01/21/2024   Procedure: DENTAL RESTORATION/EXTRACTIONS NUMBER 6, 7, 8, 9, 10, 11, 32, AND ALVEOLOPLASTY;  Surgeon: Ocie Doyne, DMD;  Location: [facility];  Service: Oral Surgery;  Laterality: N/A;    Patient Active Problem List   Diagnosis Date Noted   s/p right knee arthroscopy lateral meniscectomy 04/29/21 05/06/2021   Old complex tear of lateral meniscus of right knee    Diverticulitis of colon 09/15/2017   Diverticulitis 05/24/2017   AKI (acute kidney injury) (HCC) 05/24/2017   Hypokalemia 05/24/2017   Essential hypertension 05/24/2017   Vertigo 05/24/2017   CAP (community acquired pneumonia) 05/04/2015   Sepsis (HCC) 05/02/2015   Acute respiratory failure (HCC) 05/02/2015    PCP: Gareth Morgan, MD  REFERRING PROVIDER: Lupita Raider NP  REFERRING DIAG: LOW BACK PAIN   Rationale for Evaluation and Treatment: Rehabilitation  THERAPY DIAG:  Low back pain, unspecified back pain laterality, unspecified chronicity, unspecified whether sciatica present  Muscle weakness (generalized)  Abnormal posture  ONSET DATE: > 2 years  SUBJECTIVE:  SUBJECTIVE STATEMENT: Pt states that she has felt great since the last physical therapy treatment. Pt reports 0/10 low back pain today. Pt states that she thinks this is because of a combination of therapy as well as her consistency on her stationary bike at home. Pt reports compliance with HEP as needed but sometimes does not follow recommendations on handout.  Eval:  Pt reporting chronic back paint hat has been receiving steroid shots in her low back. These shots were helping but a new provider wanted her to try Physical Therapy before another shot.   PERTINENT HISTORY:    PAIN:  Are you having pain? No Patient reports 0/10 pain upon presentation.  PRECAUTIONS: None  RED FLAGS: None   WEIGHT BEARING RESTRICTIONS: No  FALLS:  Has patient fallen in last 6 months? Yes. Number of falls 2x Pt was falling down back steps at 2nd from bottom step.  These past weekend, Saturday or Sunday fell getting off bed.   PATIENT GOALS: "just want the pain to leave or calm down a little"  NEXT MD VISIT: March  OBJECTIVE:  Note: Objective measures were completed at Evaluation unless otherwise noted.  DIAGNOSTIC FINDINGS:    PATIENT SURVEYS:  Modified Oswestry 21/50=42%   COGNITION: Overall cognitive status: Within functional limits for tasks assessed     SENSATION: WFL  POSTURE: decreased lumbar lordosis and weight shift left  PALPATION: Right lumbar paraspinals-tender to touch  LUMBAR ROM:   AROM eval 01/26/24  Flexion 100%   Extension 75% and painful   Right lateral flexion  WFL  Left lateral flexion  Fingers to knee, painful coming back up  Right rotation  60% painful Lt LBP  Left rotation  75% pain free   (Blank rows = not tested)  LOWER EXTREMITY ROM:     Active  Right 01/26/24 Left Eval  Hip flexion    Hip extension    Hip abduction    Hip adduction    Hip internal rotation    Hip external rotation    Knee flexion 112   Knee extension 3   Ankle dorsiflexion    Ankle plantarflexion    Ankle inversion    Ankle eversion     (Blank rows = not tested)  LOWER EXTREMITY MMT:    MMT Right 01/26/24 Left 01/26/24  Hip flexion 5 4  Hip extension 4- 4-  Hip abduction 4 4-  Hip adduction    Hip internal rotation    Hip external rotation    Knee flexion 4+ 4  Knee extension 3+ 4  Ankle dorsiflexion    Ankle plantarflexion    Ankle inversion    Ankle eversion     (Blank rows = not tested)   FUNCTIONAL TESTS:  5 times sit to stand: 13 seconds   GAIT: Distance walked: 58ft Assistive device utilized: None Level of assistance: Complete Independence Comments: Antalgic gait pattern with limited swing pattern with LLE during stance of RLE  TREATMENT DATE:  02/15/24 NuStep, seat 6, arm 6, level 2, 5.5' Supine bridges with hip abd, RTB x 3" x 10 x 2 Side lying clamshells RTB, 2 sets of 10 reps bilaterally, pt  cued for core stabilization Double knee to chest with physioball 2 sets of 30 seconds  LTR on green physioball, 2 sets of 1 minute Seated on a physioball, neutral spine:  Palloff press, RTB x 10 x 2 on each Quadruped alternating LE liftoffs 2 sets of 8 reps Seated hamstring stretch x 30"  x 2 Seated piriformis stretch x 30" x 2   02/09/24 NuStep, seat 6, arm 8, level 2, > 70 SPM, 5' Seated hamstring stretch x 30" x 3 Seated piriformis stretch x 30" x 2 Supine bridges with hip abd, RTB x 3" x 10 x 2 Double knee to chest with physioball x 5" x 10  Seated on a physioball, neutral spine:  Palloff press, RTB x 10 x 2 on each  Trunk rotation with med ball on chest x 10 on each   02/08/2024  -Supine bridges 10x10' -Supine marching with GTB at ankles 10x5' -Side lying clamshells 2 x 12 with GTB w/ 3'' -Seated Hamstring stretch 2 x30'' -Theraball posterior pelvic tilts -theraball sitting anti-rotation press x 10 bilaterally with RTB -Attempted seated marching on theraball with UE support on theraband     PATIENT EDUCATION:  Education details: Pt was educated on importance of HEP and consistent performance of core stabilization throughout therapy activities and exercises.  Person educated: Patient Education method: Medical illustrator Education comprehension: verbalized understanding  HOME EXERCISE PROGRAM: Access Code: [email or web address] URL: [email or web address] 02/09/2024 - Seated Piriformis Stretch  - 1-2 x daily - 7 x weekly - 3 reps - 30 hold  01/28/2024 - removed prone exercises - Seated Hamstring Stretch  - 1 x daily - 7 x weekly - 3 reps - 30 hold - Supine Posterior Pelvic Tilt  - 1 x daily - 7 x weekly - 2 sets - 10 reps - 3 hold - Hooklying Single Knee to Chest Stretch  - 1 x daily - 7 x weekly - 5 reps - 15 hold - Supine Lower Trunk Rotation  - 2 x daily - 7 x weekly  Date: 01/26/2024 Prepared by: Becky Sax  Exercises - Supine Transversus  Abdominis Bracing - Hands on Stomach  - 2 x daily - 7 x weekly - 1 sets - 10 reps - 5" hold - Supine Bridge  - 2 x daily - 7 x weekly - 1 sets - 10 reps - 5" hold - Prone on Elbows Stretch  - 2 x daily - 7 x weekly - 1 sets - 1 reps - 120" hold - Prone Press Up  - 2 x daily - 7 x weekly - 1 sets - 5 reps - 10" hold  ASSESSMENT:  CLINICAL IMPRESSION: Therapy session today targeted core stabilization, LE strengthening, and endurance. Pt tolerated familiar activities and initiated new exercises without increasing low back pain. Pt requires occasional verbal cueing for increased core musculature activation throughout the session. Patient would continue to benefit from skilled physical therapy for increased lumbar ROM, improved quality of life, and continued progress towards therapy goals.       OBJECTIVE IMPAIRMENTS: Abnormal gait, decreased activity tolerance, decreased balance, decreased ROM, decreased strength, impaired flexibility, postural dysfunction, and pain.   ACTIVITY LIMITATIONS: carrying, lifting, bending, sitting, squatting, sleeping, stairs, transfers, and locomotion level  PARTICIPATION LIMITATIONS: driving, community activity, occupation, and yard work  PERSONAL FACTORS: Age, Behavior pattern, and 3+ comorbidities: See chart above  are also affecting patient's functional outcome.   REHAB POTENTIAL: Good  CLINICAL DECISION MAKING: Stable/uncomplicated  EVALUATION COMPLEXITY: Moderate   GOALS: Goals reviewed with patient? No  SHORT TERM GOALS: Target date: 02/07/2024  Pt will be independent with HEP in order to demonstrate participation in Physical Therapy POC.  Baseline: Goal status: INITIAL  2.  Pt will report 5/10 pain with mobility in order to demonstrate improved pain with functional activities.  Baseline:  Goal status: INITIAL  LONG TERM GOALS: Target date: 02/28/2024  Pt will improve 5x STS by <1 seconds in order to demonstrate improved functional strength  to return to desired activities.  Baseline: See objective.  Goal status: INITIAL  2.  Pt will improve Modified Oswestry score by 10 points in order to demonstrate improved pain with functional goals and outcomes. Baseline: See objective.  Goal status: INITIAL  3.  Pt will report 3/10 pain with mobility in order to demonstrate reduced pain with overall functional status.  Baseline: See objective.  Goal status: INITIAL  4.  Pt will perform single leg balance bialterally > 10 seconds in order to demonstrate improved BLE strength and safety during dynamic gait activities.  Baseline: See objective.  Goal status: INITIAL  PLAN:  PT FREQUENCY: 2x/week  PT DURATION: 6 weeks  PLANNED INTERVENTIONS: 97164- PT Re-evaluation, 97110-Therapeutic exercises, 97530- Therapeutic activity, 97112- Neuromuscular re-education, 97535- Self Care, 16109- Manual therapy, [id number]- Gait training, [phone]- Orthotic Fit/training, 97014- Electrical stimulation (unattended), [phone]- Electrical stimulation (manual), [id number]- Traction (mechanical), Patient/Family education, Balance training, and Stair training.  PLAN FOR NEXT SESSION: Continue POC and may progress as tolerated with emphasis on progress core and proximal strengthening. Lumbar mobility.  Continue flexion based exercises and training to improve core stability and posterior pelvic tilt. Continue and progress quadruped activities as tolerated.  Luz Lex, PT, DPT 02/15/2024, 10:41 AM

## 2024-02-17 ENCOUNTER — Encounter (HOSPITAL_COMMUNITY): Payer: Self-pay

## 2024-02-17 ENCOUNTER — Ambulatory Visit (HOSPITAL_COMMUNITY): Payer: 59

## 2024-02-17 DIAGNOSIS — M6281 Muscle weakness (generalized): Secondary | ICD-10-CM

## 2024-02-17 DIAGNOSIS — R293 Abnormal posture: Secondary | ICD-10-CM | POA: Diagnosis not present

## 2024-02-17 DIAGNOSIS — M545 Low back pain, unspecified: Secondary | ICD-10-CM

## 2024-02-17 DIAGNOSIS — M79604 Pain in right leg: Secondary | ICD-10-CM | POA: Diagnosis not present

## 2024-02-17 NOTE — Therapy (Signed)
 OUTPATIENT PHYSICAL THERAPY THORACOLUMBAR TREATMENT   Patient Name: [name] MRN: [id number] DOB:[birth year], 68 y.o., female Today's Date: 02/17/2024  END OF SESSION:  PT End of Session - 02/17/24 0843     Visit Number 8    Number of Visits 12    Date for PT Re-Evaluation 02/28/24    Authorization Type [employer] Dual Complete    Authorization Time Period no auth no limit    Progress Note Due on Visit 10    PT Start Time 0803    PT Stop Time 0842    PT Time Calculation (min) 39 min    Activity Tolerance Patient tolerated treatment well;No increased pain    Behavior During Therapy WFL for tasks assessed/performed             Past Medical History:  Diagnosis Date   Anxiety    Arthritis    Asthma    Chronic knee pain    Diverticulitis    Dyspnea    with exertion   GERD (gastroesophageal reflux disease)    Headache    HSV infection    Hypertension    Low back pain 12/2023   Pneumonia    x several   Pre-diabetes    no meds, diet controlled   Seasonal allergies    Vertigo    Past Surgical History:  Procedure Laterality Date   ABDOMINAL HYSTERECTOMY     ABLATION COLPOCLESIS     CHOLECYSTECTOMY     COLONOSCOPY WITH PROPOFOL N/A 10/04/2017   Procedure: COLONOSCOPY WITH PROPOFOL;  Surgeon: Malissa Hippo, MD;  [facility];  Service: Endoscopy;  Laterality: N/A;  1:15   KNEE ARTHROSCOPY WITH LATERAL MENISECTOMY Right 04/29/2021   Procedure: RIGHT KNEE ARTHROSCOPY WITH LATERAL MENISCECTOMY;  Surgeon: Vickki Hearing, MD;  Location: [facility];  Service: Orthopedics;  Laterality: Right;   POLYPECTOMY  10/04/2017   Procedure: POLYPECTOMY;  Surgeon: Malissa Hippo, MD;  [facility];  Service: Endoscopy;;  sigmoid colon polyp   TOOTH EXTRACTION N/A 01/21/2024   Procedure: DENTAL RESTORATION/EXTRACTIONS NUMBER 6, 7, 8, 9, 10, 11, 32, AND ALVEOLOPLASTY;  Surgeon: Ocie Doyne, DMD;  Location: [facility];  Service: Oral Surgery;  Laterality: N/A;    Patient Active Problem List   Diagnosis Date Noted   s/p right knee arthroscopy lateral meniscectomy 04/29/21 05/06/2021   Old complex tear of lateral meniscus of right knee    Diverticulitis of colon 09/15/2017   Diverticulitis 05/24/2017   AKI (acute kidney injury) (HCC) 05/24/2017   Hypokalemia 05/24/2017   Essential hypertension 05/24/2017   Vertigo 05/24/2017   CAP (community acquired pneumonia) 05/04/2015   Sepsis (HCC) 05/02/2015   Acute respiratory failure (HCC) 05/02/2015    PCP: Gareth Morgan, MD  REFERRING PROVIDER: Lupita Raider NP  REFERRING DIAG: LOW BACK PAIN   Rationale for Evaluation and Treatment: Rehabilitation  THERAPY DIAG:  Low back pain, unspecified back pain laterality, unspecified chronicity, unspecified whether sciatica present  Muscle weakness (generalized)  ONSET DATE: > 2 years  SUBJECTIVE:  SUBJECTIVE STATEMENT: 1-2/10 pain this morning. New HEP is going well with no questions.   Eval:  Pt reporting chronic back paint hat has been receiving steroid shots in her low back. These shots were helping but a new provider wanted her to try Physical Therapy before another shot.   PERTINENT HISTORY:    PAIN:  Are you having pain? No Patient reports 0/10 pain upon presentation.  PRECAUTIONS: None  RED FLAGS: None   WEIGHT BEARING RESTRICTIONS: No  FALLS:  Has patient fallen in last 6 months? Yes. Number of falls 2x Pt was falling down back steps at 2nd from bottom step. These past weekend, Saturday or Sunday fell getting off bed.   PATIENT GOALS: "just want the pain to leave or calm down a little"  NEXT MD VISIT: March  OBJECTIVE:  Note: Objective measures were completed at Evaluation unless otherwise noted.  DIAGNOSTIC FINDINGS:    PATIENT  SURVEYS:  Modified Oswestry 21/50=42%   COGNITION: Overall cognitive status: Within functional limits for tasks assessed     SENSATION: WFL  POSTURE: decreased lumbar lordosis and weight shift left  PALPATION: Right lumbar paraspinals-tender to touch  LUMBAR ROM:   AROM eval 01/26/24  Flexion 100%   Extension 75% and painful   Right lateral flexion  WFL  Left lateral flexion  Fingers to knee, painful coming back up  Right rotation  60% painful Lt LBP  Left rotation  75% pain free   (Blank rows = not tested)  LOWER EXTREMITY ROM:     Active  Right 01/26/24 Left Eval  Hip flexion    Hip extension    Hip abduction    Hip adduction    Hip internal rotation    Hip external rotation    Knee flexion 112   Knee extension 3   Ankle dorsiflexion    Ankle plantarflexion    Ankle inversion    Ankle eversion     (Blank rows = not tested)  LOWER EXTREMITY MMT:    MMT Right 01/26/24 Left 01/26/24  Hip flexion 5 4  Hip extension 4- 4-  Hip abduction 4 4-  Hip adduction    Hip internal rotation    Hip external rotation    Knee flexion 4+ 4  Knee extension 3+ 4  Ankle dorsiflexion    Ankle plantarflexion    Ankle inversion    Ankle eversion     (Blank rows = not tested)   FUNCTIONAL TESTS:  5 times sit to stand: 13 seconds   GAIT: Distance walked: 92ft Assistive device utilized: None Level of assistance: Complete Independence Comments: Antalgic gait pattern with limited swing pattern with LLE during stance of RLE  TREATMENT DATE:  02/17/2024  -Quadruped lift offs with bolster under knee 2 x 10- cues for reduced lumbar rotation -Quadruped planks 3x3'' -Lifting mechanic technique for rest of session with elevated box and 5lb KB. Cues for proper hip hinge pattern with spinal stability. Multiple repetitions with 6in plyo-box and double mat    02/15/24 NuStep, seat 6, arm 6, level 2, 5.5' Supine bridges with hip abd, RTB x 3" x 10 x 2 Side lying clamshells RTB, 2  sets of 10 reps bilaterally, pt cued for core stabilization Double knee to chest with physioball 2 sets of 30 seconds  LTR on green physioball, 2 sets of 1 minute Seated on a physioball, neutral spine:  Palloff press, RTB x 10 x 2 on each Quadruped alternating LE liftoffs 2 sets of  8 reps Seated hamstring stretch x 30" x 2 Seated piriformis stretch x 30" x 2   02/09/24 NuStep, seat 6, arm 8, level 2, > 70 SPM, 5' Seated hamstring stretch x 30" x 3 Seated piriformis stretch x 30" x 2 Supine bridges with hip abd, RTB x 3" x 10 x 2 Double knee to chest with physioball x 5" x 10  Seated on a physioball, neutral spine:  Palloff press, RTB x 10 x 2 on each  Trunk rotation with med ball on chest x 10 on each     PATIENT EDUCATION:  Education details: Pt was educated on importance of HEP and consistent performance of core stabilization throughout therapy activities and exercises.  Person educated: Patient Education method: Medical illustrator Education comprehension: verbalized understanding  HOME EXERCISE PROGRAM: Access Code: [email or web address] URL: [email or web address] 02/09/2024 - Seated Piriformis Stretch  - 1-2 x daily - 7 x weekly - 3 reps - 30 hold  01/28/2024 - removed prone exercises - Seated Hamstring Stretch  - 1 x daily - 7 x weekly - 3 reps - 30 hold - Supine Posterior Pelvic Tilt  - 1 x daily - 7 x weekly - 2 sets - 10 reps - 3 hold - Hooklying Single Knee to Chest Stretch  - 1 x daily - 7 x weekly - 5 reps - 15 hold - Supine Lower Trunk Rotation  - 2 x daily - 7 x weekly  Date: 01/26/2024 Prepared by: Becky Sax  Exercises - Supine Transversus Abdominis Bracing - Hands on Stomach  - 2 x daily - 7 x weekly - 1 sets - 10 reps - 5" hold - Supine Bridge  - 2 x daily - 7 x weekly - 1 sets - 10 reps - 5" hold - Prone on Elbows Stretch  - 2 x daily - 7 x weekly - 1 sets - 1 reps - 120" hold - Prone Press Up  - 2 x daily - 7 x weekly - 1 sets - 5  reps - 10" hold  Access Code: [email or web address] URL: [email or web address] Date: 02/17/2024 Prepared by: Starling Manns  Exercises - Standing Hip Hinge with Dowel  - 1 x daily - 7 x weekly - 3 sets - 10 reps  ASSESSMENT:  CLINICAL IMPRESSION: Pt tolerating session well. Noting increased irritation of L sided back pain greater than R side with quadruped lift off technique. Practiced posterior chain strengthening with hip hinging/ Counsellor training. Pt with poor awareness of spinal stability at end of range of lift. Pt would benefit fro isometric holds in end of lifting phase. Pain continues with 1-2/10 throughout session. Patient would continue to benefit from skilled physical therapy for increased lumbar ROM, improved quality of life, and continued progress towards therapy goals.       OBJECTIVE IMPAIRMENTS: Abnormal gait, decreased activity tolerance, decreased balance, decreased ROM, decreased strength, impaired flexibility, postural dysfunction, and pain.   ACTIVITY LIMITATIONS: carrying, lifting, bending, sitting, squatting, sleeping, stairs, transfers, and locomotion level  PARTICIPATION LIMITATIONS: driving, community activity, occupation, and yard work  PERSONAL FACTORS: Age, Behavior pattern, and 3+ comorbidities: See chart above  are also affecting patient's functional outcome.   REHAB POTENTIAL: Good  CLINICAL DECISION MAKING: Stable/uncomplicated  EVALUATION COMPLEXITY: Moderate   GOALS: Goals reviewed with patient? No  SHORT TERM GOALS: Target date: 02/07/2024  Pt will be independent with HEP in order to demonstrate participation in Physical Therapy POC.  Baseline: Goal status: INITIAL  2.  Pt will report  5/10 pain with mobility in order to demonstrate improved pain with functional activities.  Baseline:  Goal status: INITIAL  LONG TERM GOALS: Target date: 02/28/2024  Pt will improve 5x STS by <1 seconds in order to demonstrate improved  functional strength to return to desired activities.  Baseline: See objective.  Goal status: INITIAL  2.  Pt will improve Modified Oswestry score by 10 points in order to demonstrate improved pain with functional goals and outcomes. Baseline: See objective.  Goal status: INITIAL  3.  Pt will report 3/10 pain with mobility in order to demonstrate reduced pain with overall functional status.  Baseline: See objective.  Goal status: INITIAL  4.  Pt will perform single leg balance bialterally > 10 seconds in order to demonstrate improved BLE strength and safety during dynamic gait activities.  Baseline: See objective.  Goal status: INITIAL  PLAN:  PT FREQUENCY: 2x/week  PT DURATION: 6 weeks  PLANNED INTERVENTIONS: 97164- PT Re-evaluation, 97110-Therapeutic exercises, 97530- Therapeutic activity, 97112- Neuromuscular re-education, 97535- Self Care, 16109- Manual therapy, [id number]- Gait training, [phone]- Orthotic Fit/training, 97014- Electrical stimulation (unattended), [phone]- Electrical stimulation (manual), [id number]- Traction (mechanical), Patient/Family education, Balance training, and Stair training.  PLAN FOR NEXT SESSION: Continue POC and may progress as tolerated with emphasis on progress core and proximal strengthening. Lumbar mobility.  Continue flexion based exercises and training to improve core stability and posterior pelvic tilt. Continue and progress quadruped activities as tolerated.  Nelida Meuse PT, DPT [facility]- [facility] [phone] office

## 2024-02-22 ENCOUNTER — Ambulatory Visit (HOSPITAL_COMMUNITY): Payer: 59 | Attending: Family Medicine

## 2024-02-22 ENCOUNTER — Encounter (HOSPITAL_COMMUNITY): Payer: Self-pay

## 2024-02-22 DIAGNOSIS — M545 Low back pain, unspecified: Secondary | ICD-10-CM | POA: Insufficient documentation

## 2024-02-22 DIAGNOSIS — M6281 Muscle weakness (generalized): Secondary | ICD-10-CM | POA: Insufficient documentation

## 2024-02-22 NOTE — Therapy (Signed)
 OUTPATIENT PHYSICAL THERAPY THORACOLUMBAR TREATMENT   Patient Name: [name] MRN: [id number] DOB:01/12/1956, 68 y.o., female Today's Date: 02/22/2024  END OF SESSION:  PT End of Session - 02/22/24 0802     Visit Number 9    Number of Visits 12    Date for PT Re-Evaluation 02/28/24    Authorization Type [employer] Dual Complete    Authorization Time Period no auth no limit    Progress Note Due on Visit 10    PT Start Time 0803    PT Stop Time 0841    PT Time Calculation (min) 38 min    Activity Tolerance Patient tolerated treatment well;No increased pain    Behavior During Therapy WFL for tasks assessed/performed              Past Medical History:  Diagnosis Date   Anxiety    Arthritis    Asthma    Chronic knee pain    Diverticulitis    Dyspnea    with exertion   GERD (gastroesophageal reflux disease)    Headache    HSV infection    Hypertension    Low back pain 12/2023   Pneumonia    x several   Pre-diabetes    no meds, diet controlled   Seasonal allergies    Vertigo    Past Surgical History:  Procedure Laterality Date   ABDOMINAL HYSTERECTOMY     ABLATION COLPOCLESIS     CHOLECYSTECTOMY     COLONOSCOPY WITH PROPOFOL N/A 10/04/2017   Procedure: COLONOSCOPY WITH PROPOFOL;  Surgeon: Malissa Hippo, MD;  [facility];  Service: Endoscopy;  Laterality: N/A;  1:15   KNEE ARTHROSCOPY WITH LATERAL MENISECTOMY Right 04/29/2021   Procedure: RIGHT KNEE ARTHROSCOPY WITH LATERAL MENISCECTOMY;  Surgeon: Vickki Hearing, MD;  Location: [facility];  Service: Orthopedics;  Laterality: Right;   POLYPECTOMY  10/04/2017   Procedure: POLYPECTOMY;  Surgeon: Malissa Hippo, MD;  Location: [facility];  Service: Endoscopy;;  sigmoid colon polyp   TOOTH EXTRACTION N/A 01/21/2024   Procedure: DENTAL RESTORATION/EXTRACTIONS NUMBER 6, 7, 8, 9, 10, 11, 32, AND ALVEOLOPLASTY;  Surgeon: Ocie Doyne, DMD;  Location: [facility];  Service: Oral Surgery;  Laterality: N/A;    Patient Active Problem List   Diagnosis Date Noted   s/p right knee arthroscopy lateral meniscectomy 04/29/21 05/06/2021   Old complex tear of lateral meniscus of right knee    Diverticulitis of colon 09/15/2017   Diverticulitis 05/24/2017   AKI (acute kidney injury) (HCC) 05/24/2017   Hypokalemia 05/24/2017   Essential hypertension 05/24/2017   Vertigo 05/24/2017   CAP (community acquired pneumonia) 05/04/2015   Sepsis (HCC) 05/02/2015   Acute respiratory failure (HCC) 05/02/2015    PCP: Gareth Morgan, MD  REFERRING PROVIDER: Lupita Raider NP  REFERRING DIAG: LOW BACK PAIN   Rationale for Evaluation and Treatment: Rehabilitation  THERAPY DIAG:  Low back pain, unspecified back pain laterality, unspecified chronicity, unspecified whether sciatica present  Muscle weakness (generalized)  ONSET DATE: > 2 years  SUBJECTIVE:  SUBJECTIVE STATEMENT: 5/10 pain this morning. Pt states she feels she worked it too hard yesterday at work lifting a bucket. HEP going well but pt states that the green band exercise is too hard.  Eval:  Pt reporting chronic back paint hat has been receiving steroid shots in her low back. These shots were helping but a new provider wanted her to try Physical Therapy before another shot.   PERTINENT HISTORY:    PAIN:  Are you having pain? Yes: NPRS scale: 5/10 Pain location: center low back Pain description: sharp Aggravating factors: lifting bucket Relieving factors: rest    PRECAUTIONS: None  RED FLAGS: None   WEIGHT BEARING RESTRICTIONS: No  FALLS:  Has patient fallen in last 6 months? Yes. Number of falls 2x Pt was falling down back steps at 2nd from bottom step. These past weekend, Saturday or Sunday fell getting off bed.   PATIENT GOALS: "just want  the pain to leave or calm down a little"  NEXT MD VISIT: March  OBJECTIVE:  Note: Objective measures were completed at Evaluation unless otherwise noted.  DIAGNOSTIC FINDINGS:    PATIENT SURVEYS:  Modified Oswestry 21/50=42%   COGNITION: Overall cognitive status: Within functional limits for tasks assessed     SENSATION: WFL  POSTURE: decreased lumbar lordosis and weight shift left  PALPATION: Right lumbar paraspinals-tender to touch  LUMBAR ROM:   AROM eval 01/26/24  Flexion 100%   Extension 75% and painful   Right lateral flexion  WFL  Left lateral flexion  Fingers to knee, painful coming back up  Right rotation  60% painful Lt LBP  Left rotation  75% pain free   (Blank rows = not tested)  LOWER EXTREMITY ROM:     Active  Right 01/26/24 Left Eval  Hip flexion    Hip extension    Hip abduction    Hip adduction    Hip internal rotation    Hip external rotation    Knee flexion 112   Knee extension 3   Ankle dorsiflexion    Ankle plantarflexion    Ankle inversion    Ankle eversion     (Blank rows = not tested)  LOWER EXTREMITY MMT:    MMT Right 01/26/24 Left 01/26/24  Hip flexion 5 4  Hip extension 4- 4-  Hip abduction 4 4-  Hip adduction    Hip internal rotation    Hip external rotation    Knee flexion 4+ 4  Knee extension 3+ 4  Ankle dorsiflexion    Ankle plantarflexion    Ankle inversion    Ankle eversion     (Blank rows = not tested)   FUNCTIONAL TESTS:  5 times sit to stand: 13 seconds   GAIT: Distance walked: 19ft Assistive device utilized: None Level of assistance: Complete Independence Comments: Antalgic gait pattern with limited swing pattern with LLE during stance of RLE  TREATMENT DATE:  02/22/2024  -NuStep, seat 6, arm 6, level 2, 5.5' -Supine bridges with hip abd, GTB 3 sets of 10 reps -Side lying clamshells RTB, 2 sets of 10 reps bilaterally, pt cued for core stabilization -Double knee to chest with physioball 2 sets, 15  reps -Supine LTR, 2 sets, 15 reps, pt cued for staying in pain free ROM -Quadruped alternating LE liftoffs 1 sets of 10 reps -Quadruped alternating UE and LE liftoffs 1 sets of 10 reps bilaterally -Lifting 5# KB off elevated surface, 2 sets of 10 reps -Lift/transfer 5# KB off elevated surface to  elevated mat table, 2 sets of 10 reps -STS with 5# KB  02/17/2024  -Quadruped lift offs with bolster under knee 2 x 10- cues for reduced lumbar rotation -Quadruped planks 3x3'' -Lifting mechanic technique for rest of session with elevated box and 5lb KB. Cues for proper hip hinge pattern with spinal stability. Multiple repetitions with 6in plyo-box and double mat    02/15/24 NuStep, seat 6, arm 6, level 4, 6' Supine bridges with hip abd, RTB x 3" x 10 x 2 Side lying clamshells RTB, 2 sets of 10 reps bilaterally, pt cued for core stabilization Double knee to chest with physioball 2 sets of 30 seconds  LTR on green physioball, 2 sets of 1 minute Seated on a physioball, neutral spine:  Palloff press, RTB x 10 x 2 on each Quadruped alternating LE liftoffs 2 sets of 8 reps Seated hamstring stretch x 30" x 2 Seated piriformis stretch x 30" x 2     PATIENT EDUCATION:  Education details: Pt was educated on importance of HEP and consistent performance of core stabilization throughout therapy activities and exercises.  Person educated: Patient Education method: Medical illustrator Education comprehension: verbalized understanding  HOME EXERCISE PROGRAM: Access Code: [email or web address] URL: [email or web address] 02/09/2024 - Seated Piriformis Stretch  - 1-2 x daily - 7 x weekly - 3 reps - 30 hold  01/28/2024 - removed prone exercises - Seated Hamstring Stretch  - 1 x daily - 7 x weekly - 3 reps - 30 hold - Supine Posterior Pelvic Tilt  - 1 x daily - 7 x weekly - 2 sets - 10 reps - 3 hold - Hooklying Single Knee to Chest Stretch  - 1 x daily - 7 x weekly - 5 reps - 15 hold -  Supine Lower Trunk Rotation  - 2 x daily - 7 x weekly  Date: 01/26/2024 Prepared by: Becky Sax  Exercises - Supine Transversus Abdominis Bracing - Hands on Stomach  - 2 x daily - 7 x weekly - 1 sets - 10 reps - 5" hold - Supine Bridge  - 2 x daily - 7 x weekly - 1 sets - 10 reps - 5" hold - Prone on Elbows Stretch  - 2 x daily - 7 x weekly - 1 sets - 1 reps - 120" hold - Prone Press Up  - 2 x daily - 7 x weekly - 1 sets - 5 reps - 10" hold  Access Code: [email or web address] URL: [email or web address] Date: 02/17/2024 Prepared by: Starling Manns  Exercises - Standing Hip Hinge with Dowel  - 1 x daily - 7 x weekly - 3 sets - 10 reps  ASSESSMENT:  CLINICAL IMPRESSION: 02/22/24: Pt able to continue to demonstrate the ability to perform core stabilization during complex balance difficult balance activities. Pt continues to demonstrate need for several verbal cues for proper form when lifting objects. Patient would continue to benefit from skilled physical therapy for increased pain free lumbar ROM, improved quality of life, and continued progress towards therapy goals.    Pt tolerating session well. Noting increased irritation of L sided back pain greater than R side with quadruped lift off technique. Practiced posterior chain strengthening with hip hinging/ Counsellor training. Pt with poor awareness of spinal stability at end of range of lift. Pt would benefit fro isometric holds in end of lifting phase. Pain continues with 1-2/10 throughout session. Patient would continue to benefit from skilled physical therapy for increased lumbar ROM, improved quality of life, and continued  progress towards therapy goals.       OBJECTIVE IMPAIRMENTS: Abnormal gait, decreased activity tolerance, decreased balance, decreased ROM, decreased strength, impaired flexibility, postural dysfunction, and pain.   ACTIVITY LIMITATIONS: carrying, lifting, bending, sitting, squatting, sleeping,  stairs, transfers, and locomotion level  PARTICIPATION LIMITATIONS: driving, community activity, occupation, and yard work  PERSONAL FACTORS: Age, Behavior pattern, and 3+ comorbidities: See chart above  are also affecting patient's functional outcome.   REHAB POTENTIAL: Good  CLINICAL DECISION MAKING: Stable/uncomplicated  EVALUATION COMPLEXITY: Moderate   GOALS: Goals reviewed with patient? No  SHORT TERM GOALS: Target date: 02/07/2024  Pt will be independent with HEP in order to demonstrate participation in Physical Therapy POC.  Baseline: Goal status: INITIAL  2.  Pt will report 5/10 pain with mobility in order to demonstrate improved pain with functional activities.  Baseline:  Goal status: INITIAL  LONG TERM GOALS: Target date: 02/28/2024  Pt will improve 5x STS by <1 seconds in order to demonstrate improved functional strength to return to desired activities.  Baseline: See objective.  Goal status: INITIAL  2.  Pt will improve Modified Oswestry score by 10 points in order to demonstrate improved pain with functional goals and outcomes. Baseline: See objective.  Goal status: INITIAL  3.  Pt will report 3/10 pain with mobility in order to demonstrate reduced pain with overall functional status.  Baseline: See objective.  Goal status: INITIAL  4.  Pt will perform single leg balance bialterally > 10 seconds in order to demonstrate improved BLE strength and safety during dynamic gait activities.  Baseline: See objective.  Goal status: INITIAL  PLAN:  PT FREQUENCY: 2x/week  PT DURATION: 6 weeks  PLANNED INTERVENTIONS: 97164- PT Re-evaluation, 97110-Therapeutic exercises, 97530- Therapeutic activity, 97112- Neuromuscular re-education, 97535- Self Care, 24401- Manual therapy, [id number]- Gait training, [phone]- Orthotic Fit/training, 97014- Electrical stimulation (unattended), [phone]- Electrical stimulation (manual), [id number]- Traction (mechanical), Patient/Family education,  Balance training, and Stair training.  PLAN FOR NEXT SESSION: Continue POC. Progress lifting activities as tolerated. Continue and progress quadruped activities as tolerated.  Luz Lex, PT, DPT [facility] Office: [phone]

## 2024-02-23 DIAGNOSIS — G894 Chronic pain syndrome: Secondary | ICD-10-CM | POA: Diagnosis not present

## 2024-02-24 ENCOUNTER — Encounter (HOSPITAL_COMMUNITY): Payer: Self-pay

## 2024-02-24 ENCOUNTER — Ambulatory Visit (HOSPITAL_COMMUNITY): Payer: 59

## 2024-02-24 DIAGNOSIS — M6281 Muscle weakness (generalized): Secondary | ICD-10-CM

## 2024-02-24 DIAGNOSIS — M545 Low back pain, unspecified: Secondary | ICD-10-CM | POA: Diagnosis not present

## 2024-02-24 NOTE — Therapy (Addendum)
 OUTPATIENT PHYSICAL THERAPY THORACOLUMBAR TREATMENT/PROGRESS NOTE/DC  PHYSICAL THERAPY DISCHARGE SUMMARY PROGRESS NOTE FROM 01/17/2024 - 02/28/2024  Visits from Start of Care: 10  Current functional level related to goals / functional outcomes: See below   Remaining deficits: See below   Education / Equipment: See HEP below   Patient agrees to discharge. Patient goals were met. Patient is being discharged due to being pleased with the current functional level.  Patient Name: [name] MRN: [id number] DOB:Apr 17, 1956, 68 y.o., female Today's Date: 02/24/2024  END OF SESSION:  PT End of Session - 02/24/24 0914     Visit Number 10    Number of Visits 12    Authorization Type [employer] Dual Complete    Authorization Time Period no auth no limit    Progress Note Due on Visit 10    PT Start Time 0840    PT Stop Time 0911    PT Time Calculation (min) 31 min    Activity Tolerance Patient tolerated treatment well;No increased pain    Behavior During Therapy WFL for tasks assessed/performed               Past Medical History:  Diagnosis Date   Anxiety    Arthritis    Asthma    Chronic knee pain    Diverticulitis    Dyspnea    with exertion   GERD (gastroesophageal reflux disease)    Headache    HSV infection    Hypertension    Low back pain 12/2023   Pneumonia    x several   Pre-diabetes    no meds, diet controlled   Seasonal allergies    Vertigo    Past Surgical History:  Procedure Laterality Date   ABDOMINAL HYSTERECTOMY     ABLATION COLPOCLESIS     CHOLECYSTECTOMY     COLONOSCOPY WITH PROPOFOL N/A 10/04/2017   Procedure: COLONOSCOPY WITH PROPOFOL;  Surgeon: Malissa Hippo, MD;  [facility];  Service: Endoscopy;  Laterality: N/A;  1:15   KNEE ARTHROSCOPY WITH LATERAL MENISECTOMY Right 04/29/2021   Procedure: RIGHT KNEE ARTHROSCOPY WITH LATERAL MENISCECTOMY;  Surgeon: Vickki Hearing, MD;  Location: [facility];  Service: Orthopedics;   Laterality: Right;   POLYPECTOMY  10/04/2017   Procedure: POLYPECTOMY;  Surgeon: Malissa Hippo, MD;  [facility];  Service: Endoscopy;;  sigmoid colon polyp   TOOTH EXTRACTION N/A 01/21/2024   Procedure: DENTAL RESTORATION/EXTRACTIONS NUMBER 6, 7, 8, 9, 10, 11, 32, AND ALVEOLOPLASTY;  Surgeon: Ocie Doyne, DMD;  Location: [facility];  Service: Oral Surgery;  Laterality: N/A;   Patient Active Problem List   Diagnosis Date Noted   s/p right knee arthroscopy lateral meniscectomy 04/29/21 05/06/2021   Old complex tear of lateral meniscus of right knee    Diverticulitis of colon 09/15/2017   Diverticulitis 05/24/2017   AKI (acute kidney injury) (HCC) 05/24/2017   Hypokalemia 05/24/2017   Essential hypertension 05/24/2017   Vertigo 05/24/2017   CAP (community acquired pneumonia) 05/04/2015   Sepsis (HCC) 05/02/2015   Acute respiratory failure (HCC) 05/02/2015    PCP: Gareth Morgan, MD  REFERRING PROVIDER: Lupita Raider NP  REFERRING DIAG: LOW BACK PAIN   Rationale for Evaluation and Treatment: Rehabilitation  THERAPY DIAG:  Low back pain, unspecified back pain laterality, unspecified chronicity, unspecified whether sciatica present  Muscle weakness (generalized)  ONSET DATE: > 2 years  SUBJECTIVE:  SUBJECTIVE STATEMENT: No pain. Pt reports that she feels that she is ready to be done. She reports that she believes she can manage all of HEP independently and practicing. Pt is reporting she notices a large difference with Low back issues, and now notices improvement with knees.  PERTINENT HISTORY:    PAIN:  Are you having pain? Yes: NPRS scale: 5/10 Pain location: center low back Pain description: sharp Aggravating factors: lifting bucket Relieving factors: rest    PRECAUTIONS:  None  RED FLAGS: None   WEIGHT BEARING RESTRICTIONS: No  FALLS:  Has patient fallen in last 6 months? Yes. Number of falls 2x Pt was falling down back steps at 2nd from bottom step. These past weekend, Saturday or Sunday fell getting off bed.   PATIENT GOALS: "just want the pain to leave or calm down a little"  NEXT MD VISIT: March  OBJECTIVE:  Note: Objective measures were completed at Evaluation unless otherwise noted.  DIAGNOSTIC FINDINGS:    PATIENT SURVEYS:  Modified Oswestry 21/50=42%   COGNITION: Overall cognitive status: Within functional limits for tasks assessed     SENSATION: WFL  POSTURE: decreased lumbar lordosis and weight shift left  PALPATION: Right lumbar paraspinals-tender to touch  LUMBAR ROM:   AROM eval 01/26/24 02/24/2024  Flexion 100%  100%  Extension 75% and painful  85% and pain free  Right lateral flexion  WFL Fingers to knee joint-pain free  Left lateral flexion  Fingers to knee, painful coming back up Fingers to knee joint-pain free  Right rotation  60% painful Lt LBP 85% and painfree  Left rotation  75% pain free 85% and painfree   (Blank rows = not tested)  LOWER EXTREMITY ROM:     Active  Right 01/26/24 Left Eval  Hip flexion    Hip extension    Hip abduction    Hip adduction    Hip internal rotation    Hip external rotation    Knee flexion 112   Knee extension 3   Ankle dorsiflexion    Ankle plantarflexion    Ankle inversion    Ankle eversion     (Blank rows = not tested)  LOWER EXTREMITY MMT:    MMT Right 01/26/24 Left 01/26/24 R 02/24/2024 L 02/24/2024  Hip flexion 5 4 5 5   Hip extension 4- 4- 4 4  Hip abduction 4 4- 4+ 4  Hip adduction      Hip internal rotation      Hip external rotation      Knee flexion 4+ 4 4+ 4  Knee extension 3+ 4 4- 4+  Ankle dorsiflexion      Ankle plantarflexion      Ankle inversion      Ankle eversion       (Blank rows = not tested)   FUNCTIONAL TESTS:  5 times sit to stand: 13  seconds   GAIT: Distance walked: 49ft Assistive device utilized: None Level of assistance: Complete Independence Comments: Antalgic gait pattern with limited swing pattern with LLE during stance of RLE  TREATMENT DATE:  02/24/2024  -5x STS: 10 seconds -MMT -ROM -Modified Oswestry: 3/50= 6.0% -L SLS: 23 seconds  R SLS: 27 seconds -Incorporating HEP and functional skills in to daily life  02/22/2024  -NuStep, seat 6, arm 6, level 2, 5.5' -Supine bridges with hip abd, GTB 3 sets of 10 reps -Side lying clamshells RTB, 2 sets of 10 reps bilaterally, pt cued for core stabilization -Double knee to chest with  physioball 2 sets, 15 reps -Supine LTR, 2 sets, 15 reps, pt cued for staying in pain free ROM -Quadruped alternating LE liftoffs 1 sets of 10 reps -Quadruped alternating UE and LE liftoffs 1 sets of 10 reps bilaterally -Lifting 5# KB off elevated surface, 2 sets of 10 reps -Lift/transfer 5# KB off elevated surface to elevated mat table, 2 sets of 10 reps -STS with 5# KB  02/17/2024  -Quadruped lift offs with bolster under knee 2 x 10- cues for reduced lumbar rotation -Quadruped planks 3x3'' -Lifting mechanic technique for rest of session with elevated box and 5lb KB. Cues for proper hip hinge pattern with spinal stability. Multiple repetitions with 6in plyo-box and double mat    PATIENT EDUCATION:  Education details: Pt was educated on importance of HEP and consistent performance of core stabilization throughout therapy activities and exercises.  Person educated: Patient Education method: Medical illustrator Education comprehension: verbalized understanding  HOME EXERCISE PROGRAM: Access Code: [email or web address] URL: [email or web address] 02/09/2024 - Seated Piriformis Stretch  - 1-2 x daily - 7 x weekly - 3 reps - 30 hold  01/28/2024 - removed prone exercises - Seated Hamstring Stretch  - 1 x daily - 7 x weekly - 3 reps - 30 hold - Supine Posterior Pelvic  Tilt  - 1 x daily - 7 x weekly - 2 sets - 10 reps - 3 hold - Hooklying Single Knee to Chest Stretch  - 1 x daily - 7 x weekly - 5 reps - 15 hold - Supine Lower Trunk Rotation  - 2 x daily - 7 x weekly  Date: 01/26/2024 Prepared by: Becky Sax  Exercises - Supine Transversus Abdominis Bracing - Hands on Stomach  - 2 x daily - 7 x weekly - 1 sets - 10 reps - 5" hold - Supine Bridge  - 2 x daily - 7 x weekly - 1 sets - 10 reps - 5" hold - Prone on Elbows Stretch  - 2 x daily - 7 x weekly - 1 sets - 1 reps - 120" hold - Prone Press Up  - 2 x daily - 7 x weekly - 1 sets - 5 reps - 10" hold  Access Code: [email or web address] URL: [email or web address] Date: 02/17/2024 Prepared by: Starling Manns  Exercises - Standing Hip Hinge with Dowel  - 1 x daily - 7 x weekly - 3 sets - 10 reps  ASSESSMENT:  CLINICAL IMPRESSION: Progress note today. Pt has met all goals. . Pt demonstrated great strides in progress, with no pain is independent in all HEP. Reports excellence education and is ready for discharge. Pt is discharged from skilled PT services as this time.     OBJECTIVE IMPAIRMENTS: Abnormal gait, decreased activity tolerance, decreased balance, decreased ROM, decreased strength, impaired flexibility, postural dysfunction, and pain.   ACTIVITY LIMITATIONS: carrying, lifting, bending, sitting, squatting, sleeping, stairs, transfers, and locomotion level  PARTICIPATION LIMITATIONS: driving, community activity, occupation, and yard work  PERSONAL FACTORS: Age, Behavior pattern, and 3+ comorbidities: See chart above  are also affecting patient's functional outcome.   REHAB POTENTIAL: Good  CLINICAL DECISION MAKING: Stable/uncomplicated  EVALUATION COMPLEXITY: Moderate   GOALS: Goals reviewed with patient? No  SHORT TERM GOALS: Target date: 02/07/2024  Pt will be independent with HEP in order to demonstrate participation in Physical Therapy POC.  Baseline: Goal status:  INITIAL  2.  Pt will report 5/10 pain with mobility in order to demonstrate improved pain with functional activities.  Baseline:  Goal status: INITIAL  LONG TERM GOALS: Target date: 02/28/2024  Pt will improve 5x STS by <1 seconds in order to demonstrate improved functional strength to return to desired activities.  Baseline: See objective.  Goal status: MET  2.  Pt will improve Modified Oswestry score by 10 points in order to demonstrate improved pain with functional goals and outcomes. Baseline: See objective.  Goal status: MET  3.  Pt will report 3/10 pain with mobility in order to demonstrate reduced pain with overall functional status.  Baseline: See objective.  Goal status: MET  4.  Pt will perform single leg balance bialterally > 10 seconds in order to demonstrate improved BLE strength and safety during dynamic gait activities.  Baseline: See objective.  Goal status: MET  PLAN:  PT FREQUENCY: 2x/week  PT DURATION: 6 weeks  PLANNED INTERVENTIONS: 97164- PT Re-evaluation, 97110-Therapeutic exercises, 97530- Therapeutic activity, 97112- Neuromuscular re-education, 97535- Self Care, 16109- Manual therapy, [id number]- Gait training, [phone]- Orthotic Fit/training, 97014- Electrical stimulation (unattended), [phone]- Electrical stimulation (manual), [id number]- Traction (mechanical), Patient/Family education, Balance training, and Stair training.  PLAN FOR NEXT SESSION: Continue POC. Progress lifting activities as tolerated. Continue and progress quadruped activities as tolerated.  Nelida Meuse PT, DPT [facility] [phone] office

## 2024-02-29 ENCOUNTER — Encounter (HOSPITAL_COMMUNITY): Payer: 59

## 2024-03-02 ENCOUNTER — Encounter (HOSPITAL_COMMUNITY): Payer: 59

## 2024-04-17 ENCOUNTER — Emergency Department (HOSPITAL_COMMUNITY)
Admission: EM | Admit: 2024-04-17 | Discharge: 2024-04-17 | Disposition: A | Discharge status: Home / Self Care | Attending: Emergency Medicine | Admitting: Emergency Medicine

## 2024-04-17 ENCOUNTER — Emergency Department (HOSPITAL_COMMUNITY)

## 2024-04-17 ENCOUNTER — Other Ambulatory Visit: Payer: Self-pay

## 2024-04-17 ENCOUNTER — Encounter (HOSPITAL_COMMUNITY): Payer: Self-pay

## 2024-04-17 DIAGNOSIS — M4316 Spondylolisthesis, lumbar region: Secondary | ICD-10-CM | POA: Diagnosis not present

## 2024-04-17 DIAGNOSIS — M791 Myalgia, unspecified site: Secondary | ICD-10-CM | POA: Diagnosis not present

## 2024-04-17 DIAGNOSIS — I6782 Cerebral ischemia: Secondary | ICD-10-CM | POA: Insufficient documentation

## 2024-04-17 DIAGNOSIS — I10 Essential (primary) hypertension: Secondary | ICD-10-CM | POA: Diagnosis not present

## 2024-04-17 DIAGNOSIS — Z041 Encounter for examination and observation following transport accident: Secondary | ICD-10-CM | POA: Diagnosis not present

## 2024-04-17 DIAGNOSIS — Z79899 Other long term (current) drug therapy: Secondary | ICD-10-CM | POA: Insufficient documentation

## 2024-04-17 DIAGNOSIS — J45909 Unspecified asthma, uncomplicated: Secondary | ICD-10-CM | POA: Diagnosis not present

## 2024-04-17 DIAGNOSIS — M542 Cervicalgia: Secondary | ICD-10-CM | POA: Diagnosis not present

## 2024-04-17 DIAGNOSIS — M545 Low back pain, unspecified: Secondary | ICD-10-CM | POA: Diagnosis not present

## 2024-04-17 DIAGNOSIS — R519 Headache, unspecified: Secondary | ICD-10-CM | POA: Diagnosis not present

## 2024-04-17 DIAGNOSIS — Z91040 Latex allergy status: Secondary | ICD-10-CM | POA: Insufficient documentation

## 2024-04-17 DIAGNOSIS — Y92481 Parking lot as the place of occurrence of the external cause: Secondary | ICD-10-CM | POA: Insufficient documentation

## 2024-04-17 DIAGNOSIS — Z7951 Long term (current) use of inhaled steroids: Secondary | ICD-10-CM | POA: Insufficient documentation

## 2024-04-17 DIAGNOSIS — M549 Dorsalgia, unspecified: Secondary | ICD-10-CM | POA: Diagnosis not present

## 2024-04-17 DIAGNOSIS — M5124 Other intervertebral disc displacement, thoracic region: Secondary | ICD-10-CM | POA: Diagnosis not present

## 2024-04-17 MED ORDER — METHOCARBAMOL 500 MG PO TABS: 500.0000 mg | ORAL_TABLET | Freq: Two times a day (BID) | ORAL | 0 refills | Status: DC
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
## 2024-05-03 ENCOUNTER — Emergency Department (HOSPITAL_COMMUNITY)

## 2024-05-03 ENCOUNTER — Other Ambulatory Visit: Payer: Self-pay

## 2024-05-03 ENCOUNTER — Encounter (HOSPITAL_COMMUNITY): Payer: Self-pay

## 2024-05-03 ENCOUNTER — Emergency Department (HOSPITAL_COMMUNITY)
Admission: EM | Admit: 2024-05-03 | Discharge: 2024-05-03 | Disposition: A | Discharge status: Home / Self Care | Attending: Emergency Medicine | Admitting: Emergency Medicine

## 2024-05-03 DIAGNOSIS — I10 Essential (primary) hypertension: Secondary | ICD-10-CM | POA: Insufficient documentation

## 2024-05-03 DIAGNOSIS — Q048 Other specified congenital malformations of brain: Secondary | ICD-10-CM | POA: Diagnosis not present

## 2024-05-03 DIAGNOSIS — R29818 Other symptoms and signs involving the nervous system: Secondary | ICD-10-CM | POA: Diagnosis not present

## 2024-05-03 DIAGNOSIS — J45909 Unspecified asthma, uncomplicated: Secondary | ICD-10-CM | POA: Diagnosis not present

## 2024-05-03 DIAGNOSIS — R202 Paresthesia of skin: Secondary | ICD-10-CM | POA: Insufficient documentation

## 2024-05-03 DIAGNOSIS — S80261A Insect bite (nonvenomous), right knee, initial encounter: Secondary | ICD-10-CM | POA: Insufficient documentation

## 2024-05-03 DIAGNOSIS — R200 Anesthesia of skin: Secondary | ICD-10-CM | POA: Diagnosis not present

## 2024-05-03 DIAGNOSIS — E237 Disorder of pituitary gland, unspecified: Secondary | ICD-10-CM | POA: Insufficient documentation

## 2024-05-03 DIAGNOSIS — Z91040 Latex allergy status: Secondary | ICD-10-CM | POA: Diagnosis not present

## 2024-05-03 DIAGNOSIS — R000 Tachycardia, unspecified: Secondary | ICD-10-CM | POA: Diagnosis not present

## 2024-05-03 DIAGNOSIS — W57XXXA Bitten or stung by nonvenomous insect and other nonvenomous arthropods, initial encounter: Secondary | ICD-10-CM | POA: Diagnosis not present

## 2024-05-03 DIAGNOSIS — I6782 Cerebral ischemia: Secondary | ICD-10-CM | POA: Diagnosis not present

## 2024-05-03 LAB — URINALYSIS, ROUTINE W REFLEX MICROSCOPIC
Bilirubin Urine: NEGATIVE
Glucose, UA: NEGATIVE mg/dL
Hgb urine dipstick: NEGATIVE
Ketones, ur: NEGATIVE mg/dL
Nitrite: NEGATIVE
Protein, ur: NEGATIVE mg/dL
Specific Gravity, Urine: 1.005 (ref 1.005–1.030)
pH: 7 (ref 5.0–8.0)

## 2024-05-03 LAB — URINALYSIS, MICROSCOPIC (REFLEX)

## 2024-05-03 LAB — CBC WITH DIFFERENTIAL/PLATELET
Abs Immature Granulocytes: 0.06 10*3/uL (ref 0.00–0.07)
Basophils Absolute: 0.1 10*3/uL (ref 0.0–0.1)
Basophils Relative: 1 %
Eosinophils Absolute: 0 10*3/uL (ref 0.0–0.5)
Eosinophils Relative: 0 %
HCT: 39.4 % (ref 36.0–46.0)
Hemoglobin: 13 g/dL (ref 12.0–15.0)
Immature Granulocytes: 1 %
Lymphocytes Relative: 26 %
Lymphs Abs: 1.6 10*3/uL (ref 0.7–4.0)
MCH: 28.1 pg (ref 26.0–34.0)
MCHC: 33 g/dL (ref 30.0–36.0)
MCV: 85.3 fL (ref 80.0–100.0)
Monocytes Absolute: 0.6 10*3/uL (ref 0.1–1.0)
Monocytes Relative: 10 %
Neutro Abs: 3.6 10*3/uL (ref 1.7–7.7)
Neutrophils Relative %: 62 %
Platelets: 247 10*3/uL (ref 150–400)
RBC: 4.62 MIL/uL (ref 3.87–5.11)
RDW: 15.1 % (ref 11.5–15.5)
WBC: 5.9 10*3/uL (ref 4.0–10.5)
nRBC: 0 % (ref 0.0–0.2)

## 2024-05-03 LAB — COMPREHENSIVE METABOLIC PANEL WITH GFR
ALT: 23 U/L (ref 0–44)
AST: 42 U/L — ABNORMAL HIGH (ref 15–41)
Albumin: 3.9 g/dL (ref 3.5–5.0)
Alkaline Phosphatase: 94 U/L (ref 38–126)
Anion gap: 9 (ref 5–15)
BUN: 10 mg/dL (ref 8–23)
CO2: 24 mmol/L (ref 22–32)
Calcium: 11 mg/dL — ABNORMAL HIGH (ref 8.9–10.3)
Chloride: 104 mmol/L (ref 98–111)
Creatinine, Ser: 1.29 mg/dL — ABNORMAL HIGH (ref 0.44–1.00)
GFR, Estimated: 45 mL/min — ABNORMAL LOW (ref >60)
Glucose, Bld: 91 mg/dL (ref 70–99)
Potassium: 4.1 mmol/L (ref 3.5–5.1)
Sodium: 137 mmol/L (ref 135–145)
Total Bilirubin: 0.9 mg/dL (ref 0.0–1.2)
Total Protein: 7 g/dL (ref 6.5–8.1)

## 2024-05-03 LAB — I-STAT CHEM 8, ED
BUN: 13 mg/dL (ref 8–23)
Calcium, Ion: 1.35 mmol/L (ref 1.15–1.40)
Chloride: 103 mmol/L (ref 98–111)
Creatinine, Ser: 1.3 mg/dL — ABNORMAL HIGH (ref 0.44–1.00)
Glucose, Bld: 92 mg/dL (ref 70–99)
HCT: 41 % (ref 36.0–46.0)
Hemoglobin: 13.9 g/dL (ref 12.0–15.0)
Potassium: 4.2 mmol/L (ref 3.5–5.1)
Sodium: 138 mmol/L (ref 135–145)
TCO2: 24 mmol/L (ref 22–32)

## 2024-05-03 LAB — CBG MONITORING, ED: Glucose-Capillary: 91 mg/dL (ref 70–99)

## 2024-05-03 MED ORDER — SODIUM CHLORIDE 0.9 % IV BOLUS
500.0000 mL | Freq: Once | INTRAVENOUS | Status: AC
  Administered 2024-05-03: 500 mL via INTRAVENOUS

## 2024-05-03 MED ORDER — HYDROCODONE-ACETAMINOPHEN 5-325 MG PO TABS: 1.0000 | ORAL_TABLET | ORAL | 0 refills | Status: AC | PRN

## 2024-05-03 MED ORDER — DOXYCYCLINE HYCLATE 100 MG PO CAPS: 100.0000 mg | ORAL_CAPSULE | Freq: Two times a day (BID) | ORAL | 0 refills | Status: DC

## 2024-05-03 MED ORDER — PREDNISONE 50 MG PO TABS: 50.0000 mg | ORAL_TABLET | Freq: Every day | ORAL | 0 refills | Status: DC

## 2024-05-03 NOTE — ED Provider Notes (Signed)
 [place] [facility] DEPARTMENT AT [facility] Provider Note   CSN: [id number] Arrival date & time: 05/03/24  1716     History  Chief Complaint  Patient presents with   Numbness    [name] is a 68 y.o. female.  Pt is a 68 yo female with pmhx significant for htn, asthma, gerd, arthritis, and prediabetes.  Pt said she's been having intermittent numbness to the left arm and leg.  She has some blurry vision in both eyes.  She has pain to both legs, but the left leg is worse.  She did pull a tick off her right leg (behind right knee) today.       Home Medications Prior to Admission medications   Medication Sig Start Date End Date Taking? Authorizing Provider  albuterol  (PROVENTIL  HFA;VENTOLIN  HFA) 108 (90 Base) MCG/ACT inhaler Inhale 2 puffs into the lungs every 6 (six) hours as needed for wheezing or shortness of breath.    Yes [provider]  ALPRAZolam  (XANAX ) 1 MG tablet Take 2 mg by mouth at bedtime.   Yes [provider]  ascorbic acid (VITAMIN C) 500 MG tablet Take 500 mg by mouth daily.   Yes [provider]  doxycycline (VIBRAMYCIN) 100 MG capsule Take 1 capsule (100 mg total) by mouth 2 (two) times daily. 05/03/24  Yes Dashayla Theissen, MD  fluorometholone (FML) 0.1 % ophthalmic suspension Place 1 drop into both eyes at bedtime. 08/07/19  Yes [provider]  fluticasone (FLOVENT HFA) 110 MCG/ACT inhaler Inhale 2 puffs into the lungs daily as needed (asthma).   Yes [provider]  HYDROcodone -acetaminophen  (NORCO/VICODIN) 5-325 MG tablet Take 1 tablet by mouth every 4 (four) hours as needed. 05/03/24  Yes Sueellen Emery, MD  Multiple Vitamin (MULTIVITAMIN WITH MINERALS) TABS tablet Take 1 tablet by mouth daily.   Yes [provider]  Oxycodone  HCl 10 MG TABS Take 10 mg by mouth every 8 (eight) hours as needed (severe pain).   Yes [provider]  pantoprazole (PROTONIX) 40 MG tablet Take 1 tablet  by mouth daily. 11/24/21  Yes [provider]  polyvinyl alcohol (LIQUIFILM TEARS) 1.4 % ophthalmic solution Place 1 drop into both eyes as needed for dry eyes.   Yes [provider]  predniSONE  (DELTASONE ) 50 MG tablet Take 1 tablet (50 mg total) by mouth daily with breakfast. 05/03/24  Yes Sueellen Emery, MD  triamcinolone  cream (KENALOG ) 0.1 % Apply 1 Application topically daily as needed (irritation).   Yes [provider]  triamterene -hydrochlorothiazide (MAXZIDE) 75-50 MG tablet Take 1 tablet by mouth daily.   Yes [provider]  valACYclovir (VALTREX) 500 MG tablet Take 1 tablet by mouth 2 (two) times daily as needed (outbreak).   Yes [provider]  methocarbamol  (ROBAXIN ) 500 MG tablet Take 1 tablet (500 mg total) by mouth 2 (two) times daily. Patient not taking: Reported on 05/03/2024 04/17/24   Felicie Horning, PA-C      Allergies    Iohexol, Benadryl  [diphenhydramine ], Latex, and Sulfa antibiotics    Review of Systems   Review of Systems  Neurological:  Positive for numbness.  All other systems reviewed and are negative.   Physical Exam Updated Vital Signs BP (!) 141/67   Pulse (!) 53   Temp 98.8 F (37.1 C)   Resp 18   Ht 5' (1.524 m)   Wt 90.7 kg   SpO2 98%   BMI 39.06 kg/m  Physical Exam Vitals and  nursing note reviewed.  Constitutional:      Appearance: Normal appearance.  HENT:     Head: Normocephalic and atraumatic.     Right Ear: External ear normal.     Left Ear: External ear normal.     Nose: Nose normal.     Mouth/Throat:     Mouth: Mucous membranes are moist.     Pharynx: Oropharynx is clear.  Eyes:     Extraocular Movements: Extraocular movements intact.     Conjunctiva/sclera: Conjunctivae normal.     Pupils: Pupils are equal, round, and reactive to light.  Cardiovascular:     Rate and Rhythm: Normal rate and regular rhythm.     Pulses: Normal pulses.     Heart sounds: Normal heart sounds.   Pulmonary:     Effort: Pulmonary effort is normal.     Breath sounds: Normal breath sounds.  Abdominal:     General: Abdomen is flat. Bowel sounds are normal.     Palpations: Abdomen is soft.  Musculoskeletal:        General: Normal range of motion.     Cervical back: Normal range of motion and neck supple.  Skin:    General: Skin is warm.     Capillary Refill: Capillary refill takes less than 2 seconds.  Neurological:     Mental Status: She is alert and oriented to person, place, and time.     Comments: Minimal weakness left arm/leg.  No facial muscle weakness.  Psychiatric:        Mood and Affect: Mood normal.        Behavior: Behavior normal.     ED Results / Procedures / Treatments   Labs (all labs ordered are listed, but only abnormal results are displayed) Labs Reviewed  URINALYSIS, ROUTINE W REFLEX MICROSCOPIC - Abnormal; Notable for the following components:      Result Value   Leukocytes,Ua SMALL (*)    All other components within normal limits  URINALYSIS, MICROSCOPIC (REFLEX) - Abnormal; Notable for the following components:   Bacteria, UA RARE (*)    All other components within normal limits  I-STAT CHEM 8, ED - Abnormal; Notable for the following components:   Creatinine, Ser 1.30 (*)    All other components within normal limits  CBC WITH DIFFERENTIAL/PLATELET  COMPREHENSIVE METABOLIC PANEL WITH GFR  SPOTTED FEVER GROUP ANTIBODIES  LYME DISEASE SEROLOGY W/REFLEX  CBG MONITORING, ED    EKG EKG Interpretation Date/Time:  Wednesday May 03 2024 18:15:40 EDT Ventricular Rate:  103 PR Interval:  147 QRS Duration:  132 QT Interval:  343 QTC Calculation: 449 R Axis:   -87  Text Interpretation: Sinus tachycardia RBBB and LAFB Consider left ventricular hypertrophy Inferior infarct, acute Lateral leads are also involved No significant change since last tracing Confirmed by Deatra Face [phone]) on 05/03/2024 6:29:52 PM  Radiology MR BRAIN WO  CONTRAST Result Date: 05/03/2024 CLINICAL DATA:  Neuro deficit, concern for stroke. EXAM: MRI HEAD WITHOUT CONTRAST TECHNIQUE: Multiplanar, multiecho pulse sequences of the brain and surrounding structures were obtained without intravenous contrast. COMPARISON:  Earlier same day CT head. FINDINGS: Brain: No acute infarct. No evidence of intracranial hemorrhage. Few scattered foci of T2/FLAIR hyperintensity in the periventricular and subcortical white matter likely reflecting mild chronic microvascular ischemic changes. No edema, mass effect, or midline shift. Focus of branching susceptibility in the left cerebellum suggestive of a developmental venous anomaly. There is mild associated gliosis. Fullness of the pituitary which measures up to 11  mm in craniocaudal dimension greater than expected for patient's age. The basilar cisterns are patent. No extra-axial fluid collections. Ventricles: Normal size and configuration of the ventricles. Vascular: Skull base flow voids are visualized. Skull and upper cervical spine: No focal abnormality. Sinuses/Orbits: Orbits are symmetric. Paranasal sinuses are clear. Other: Mastoid air cells are clear. IMPRESSION: No acute intracranial abnormality. Fullness of the pituitary greater than expected for patient's age which could reflect pituitary tumor. Recommend nonemergent MRI of the pituitary/sella with and without contrast. Mild chronic microvascular ischemic changes. 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 Electronically Signed   By: Denny Flack M.D.   On: 05/03/2024 20:42   CT Head Wo Contrast Result Date: 05/03/2024 CLINICAL DATA:  Intermittent leg pain and numbness. EXAM: CT HEAD WITHOUT CONTRAST TECHNIQUE: Contiguous axial images were obtained from the base of the skull through the vertex without intravenous contrast. RADIATION DOSE REDUCTION: This exam was performed according to the departmental dose-optimization program which includes automated exposure  control, adjustment of the mA and/or kV according to patient size and/or use of iterative reconstruction technique. COMPARISON:  April 17, 2024 FINDINGS: Brain: No evidence of acute infarction, hemorrhage, hydrocephalus, extra-axial collection or mass lesion/mass effect. Vascular: No hyperdense vessel or unexpected calcification. Skull: Normal. Negative for fracture or focal lesion. Sinuses/Orbits: No acute finding. Other: None. IMPRESSION: No acute intracranial pathology. Electronically Signed   By: Virgle Grime M.D.   On: 05/03/2024 18:17    Procedures Procedures    Medications Ordered in ED Medications  sodium chloride  0.9 % bolus 500 mL (0 mLs Intravenous Stopped 05/03/24 2149)    ED Course/ Medical Decision Making/ A&P                                 Medical Decision Making Amount and/or Complexity of Data Reviewed Labs: ordered. Radiology: ordered.  Risk Prescription drug management.   This patient presents to the ED for concern of weakness, this involves an extensive number of treatment options, and is a complaint that carries with it a high risk of complications and morbidity.  The differential diagnosis includes electrolyte abn, cva, infection   Co morbidities that complicate the patient evaluation   htn, asthma, gerd, arthritis, and prediabetes   Additional history obtained:  Additional history obtained from epic chart review  Lab Tests:  I Ordered, and personally interpreted labs.  The pertinent results include:  cbc nl, istat chem 8 with cr 1.3   Imaging Studies ordered:  I ordered imaging studies including ct head and mri brain  I independently visualized and interpreted imaging which showed  CT head: No acute intracranial pathology.  MRI brain: No acute intracranial abnormality.    Fullness of the pituitary greater than expected for patient's age  which could reflect pituitary tumor. Recommend nonemergent MRI of  the pituitary/sella with and  without contrast.    Mild chronic microvascular ischemic changes.    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   I agree with the radiologist interpretation   Cardiac Monitoring:  The patient was maintained on a cardiac monitor.  I personally viewed and interpreted the cardiac monitored which showed an underlying rhythm of: nsr   Medicines ordered and prescription drug management:  I ordered medication including ivfs  for sx  Reevaluation of the patient after these medicines showed that the patient improved I have reviewed the patients home medicines and have made adjustments as needed  Test Considered:  mri  Problem List / ED Course:  Paresthesias:  no evidence of CVA.  Sx could be due to tick bourne illness or pituitary abn.  Pt started on doxy.  MRI pituitary protocol ordered as outpatient.  Pt is to return if worse.  F/u with pcp.   Reevaluation:  After the interventions noted above, I reevaluated the patient and found that they have :improved   Social Determinants of Health:  Lives at home   Dispostion:  After consideration of the diagnostic results and the patients response to treatment, I feel that the patent would benefit from discharge with outpatient f/u.          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

## 2024-05-03 NOTE — ED Triage Notes (Signed)
 Pt reports intermittent pain and numbness to her legs and reports the left side arm and leg have intermittent pain and coldness x 3-4 days.

## 2024-05-03 NOTE — Discharge Instructions (Addendum)
 You will need to get an outpatient MRI to look at your pituitary gland.

## 2024-05-05 LAB — LYME DISEASE SEROLOGY W/REFLEX: Lyme Total Antibody EIA: NEGATIVE

## 2024-05-05 LAB — SPOTTED FEVER GROUP ANTIBODIES
Spotted Fever Group IgG: <1:64 {titer}
Spotted Fever Group IgM: <1:64 {titer}

## 2024-05-10 ENCOUNTER — Other Ambulatory Visit (HOSPITAL_COMMUNITY): Payer: Self-pay | Admitting: Family Medicine

## 2024-05-10 DIAGNOSIS — E236 Other disorders of pituitary gland: Secondary | ICD-10-CM

## 2024-05-10 DIAGNOSIS — R202 Paresthesia of skin: Secondary | ICD-10-CM | POA: Diagnosis not present

## 2024-05-10 DIAGNOSIS — K219 Gastro-esophageal reflux disease without esophagitis: Secondary | ICD-10-CM | POA: Diagnosis not present

## 2024-05-27 ENCOUNTER — Other Ambulatory Visit (HOSPITAL_COMMUNITY): Payer: Self-pay | Admitting: Family Medicine

## 2024-05-27 ENCOUNTER — Ambulatory Visit (HOSPITAL_COMMUNITY)
Admission: RE | Admit: 2024-05-27 | Discharge: 2024-05-27 | Disposition: A | Discharge status: Home / Self Care | Source: Ambulatory Visit | Attending: Family Medicine | Admitting: Family Medicine

## 2024-05-27 DIAGNOSIS — R930 Abnormal findings on diagnostic imaging of skull and head, not elsewhere classified: Secondary | ICD-10-CM | POA: Diagnosis not present

## 2024-05-27 DIAGNOSIS — R911 Solitary pulmonary nodule: Secondary | ICD-10-CM

## 2024-05-27 DIAGNOSIS — E236 Other disorders of pituitary gland: Secondary | ICD-10-CM

## 2024-05-27 DIAGNOSIS — R918 Other nonspecific abnormal finding of lung field: Secondary | ICD-10-CM | POA: Diagnosis not present

## 2024-05-27 DIAGNOSIS — D352 Benign neoplasm of pituitary gland: Secondary | ICD-10-CM | POA: Diagnosis not present

## 2024-05-27 DIAGNOSIS — D860 Sarcoidosis of lung: Secondary | ICD-10-CM | POA: Diagnosis not present

## 2024-05-27 DIAGNOSIS — I700 Atherosclerosis of aorta: Secondary | ICD-10-CM | POA: Diagnosis not present

## 2024-06-02 DIAGNOSIS — N390 Urinary tract infection, site not specified: Secondary | ICD-10-CM | POA: Diagnosis not present

## 2024-06-08 DIAGNOSIS — R7303 Prediabetes: Secondary | ICD-10-CM | POA: Diagnosis not present

## 2024-06-08 DIAGNOSIS — I10 Essential (primary) hypertension: Secondary | ICD-10-CM | POA: Diagnosis not present

## 2024-06-15 DIAGNOSIS — R202 Paresthesia of skin: Secondary | ICD-10-CM | POA: Diagnosis not present

## 2024-06-15 DIAGNOSIS — K219 Gastro-esophageal reflux disease without esophagitis: Secondary | ICD-10-CM | POA: Diagnosis not present

## 2024-06-15 DIAGNOSIS — G894 Chronic pain syndrome: Secondary | ICD-10-CM | POA: Diagnosis not present

## 2024-06-15 DIAGNOSIS — E236 Other disorders of pituitary gland: Secondary | ICD-10-CM | POA: Diagnosis not present

## 2024-06-15 DIAGNOSIS — J4520 Mild intermittent asthma, uncomplicated: Secondary | ICD-10-CM | POA: Diagnosis not present

## 2024-06-15 DIAGNOSIS — N289 Disorder of kidney and ureter, unspecified: Secondary | ICD-10-CM | POA: Diagnosis not present

## 2024-06-15 DIAGNOSIS — R7303 Prediabetes: Secondary | ICD-10-CM | POA: Diagnosis not present

## 2024-06-15 DIAGNOSIS — I10 Essential (primary) hypertension: Secondary | ICD-10-CM | POA: Diagnosis not present

## 2024-06-15 DIAGNOSIS — E785 Hyperlipidemia, unspecified: Secondary | ICD-10-CM | POA: Diagnosis not present

## 2024-06-15 DIAGNOSIS — E870 Hyperosmolality and hypernatremia: Secondary | ICD-10-CM | POA: Diagnosis not present

## 2024-06-29 DIAGNOSIS — E236 Other disorders of pituitary gland: Secondary | ICD-10-CM | POA: Diagnosis not present

## 2024-08-14 ENCOUNTER — Other Ambulatory Visit (HOSPITAL_COMMUNITY): Payer: Self-pay | Admitting: Family Medicine

## 2024-08-14 DIAGNOSIS — Z13820 Encounter for screening for osteoporosis: Secondary | ICD-10-CM | POA: Diagnosis not present

## 2024-08-14 DIAGNOSIS — Z79899 Other long term (current) drug therapy: Secondary | ICD-10-CM | POA: Diagnosis not present

## 2024-08-14 DIAGNOSIS — B351 Tinea unguium: Secondary | ICD-10-CM | POA: Diagnosis not present

## 2024-08-14 DIAGNOSIS — G8929 Other chronic pain: Secondary | ICD-10-CM | POA: Diagnosis not present

## 2024-08-14 DIAGNOSIS — J4520 Mild intermittent asthma, uncomplicated: Secondary | ICD-10-CM | POA: Diagnosis not present

## 2024-08-14 DIAGNOSIS — M545 Low back pain, unspecified: Secondary | ICD-10-CM | POA: Diagnosis not present

## 2024-08-17 ENCOUNTER — Ambulatory Visit (HOSPITAL_COMMUNITY)
Admission: RE | Admit: 2024-08-17 | Discharge: 2024-08-17 | Disposition: A | Discharge status: Home / Self Care | Source: Ambulatory Visit | Attending: Family Medicine | Admitting: Family Medicine

## 2024-08-17 DIAGNOSIS — Z780 Asymptomatic menopausal state: Secondary | ICD-10-CM | POA: Diagnosis not present

## 2024-08-17 DIAGNOSIS — Z13820 Encounter for screening for osteoporosis: Secondary | ICD-10-CM | POA: Insufficient documentation

## 2024-08-17 DIAGNOSIS — M810 Age-related osteoporosis without current pathological fracture: Secondary | ICD-10-CM | POA: Diagnosis not present
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
## 2024-08-23 ENCOUNTER — Other Ambulatory Visit: Payer: Self-pay

## 2024-08-23 ENCOUNTER — Ambulatory Visit (HOSPITAL_COMMUNITY): Attending: Family Medicine

## 2024-08-23 DIAGNOSIS — M545 Low back pain, unspecified: Secondary | ICD-10-CM | POA: Insufficient documentation

## 2024-08-23 DIAGNOSIS — M79604 Pain in right leg: Secondary | ICD-10-CM | POA: Insufficient documentation

## 2024-08-23 DIAGNOSIS — M79605 Pain in left leg: Secondary | ICD-10-CM | POA: Diagnosis not present

## 2024-08-23 DIAGNOSIS — R293 Abnormal posture: Secondary | ICD-10-CM | POA: Insufficient documentation

## 2024-08-23 DIAGNOSIS — M6281 Muscle weakness (generalized): Secondary | ICD-10-CM | POA: Diagnosis not present

## 2024-08-23 DIAGNOSIS — R262 Difficulty in walking, not elsewhere classified: Secondary | ICD-10-CM | POA: Diagnosis not present

## 2024-08-24 ENCOUNTER — Other Ambulatory Visit: Payer: Self-pay

## 2024-08-24 DIAGNOSIS — M810 Age-related osteoporosis without current pathological fracture: Secondary | ICD-10-CM | POA: Insufficient documentation

## 2024-08-28 ENCOUNTER — Ambulatory Visit (HOSPITAL_COMMUNITY)

## 2024-08-28 ENCOUNTER — Telehealth: Payer: Self-pay

## 2024-08-28 DIAGNOSIS — R262 Difficulty in walking, not elsewhere classified: Secondary | ICD-10-CM

## 2024-08-28 DIAGNOSIS — M79605 Pain in left leg: Secondary | ICD-10-CM

## 2024-08-28 DIAGNOSIS — M545 Low back pain, unspecified: Secondary | ICD-10-CM

## 2024-08-28 DIAGNOSIS — M79604 Pain in right leg: Secondary | ICD-10-CM | POA: Diagnosis not present

## 2024-08-28 DIAGNOSIS — R293 Abnormal posture: Secondary | ICD-10-CM | POA: Diagnosis not present

## 2024-08-28 DIAGNOSIS — M6281 Muscle weakness (generalized): Secondary | ICD-10-CM | POA: Diagnosis not present
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
## 2024-08-28 NOTE — Telephone Encounter (Signed)
 Auth Submission: DENIED Site of care: Site of care: [facility] Payer: [employer] dual complete Medication & CPT/J Code(s) submitted: Prolia (Denosumab) [id number] Diagnosis Code:  Route of submission (phone, fax, portal): portal Phone # Fax # Auth type: Buy/Bill PB Units/visits requested: 60mg  q9months Reference number: [id number]  Authorization has been DENIED because pt must try/fail preferred medications. The denial letter is scanned in the media tab.

## 2024-08-30 ENCOUNTER — Ambulatory Visit (HOSPITAL_COMMUNITY)

## 2024-08-31 ENCOUNTER — Encounter (INDEPENDENT_AMBULATORY_CARE_PROVIDER_SITE_OTHER): Payer: Self-pay | Admitting: *Deleted

## 2024-08-31 ENCOUNTER — Telehealth (HOSPITAL_COMMUNITY): Payer: Self-pay

## 2024-08-31 NOTE — Telephone Encounter (Signed)
 Pt was called concerning her missed appointment yesterday and states she came at 10:45 instead of 10:15, states she will be back on Monday. Per no show policy this is no show #1.  Lang Ada, PT, DPT [facility] Office: [phone] 10:08 AM, 08/31/24

## 2024-09-04 ENCOUNTER — Ambulatory Visit (HOSPITAL_COMMUNITY)

## 2024-09-04 DIAGNOSIS — R293 Abnormal posture: Secondary | ICD-10-CM | POA: Diagnosis not present

## 2024-09-04 DIAGNOSIS — R262 Difficulty in walking, not elsewhere classified: Secondary | ICD-10-CM | POA: Diagnosis not present

## 2024-09-04 DIAGNOSIS — M545 Low back pain, unspecified: Secondary | ICD-10-CM | POA: Diagnosis not present

## 2024-09-04 DIAGNOSIS — M6281 Muscle weakness (generalized): Secondary | ICD-10-CM

## 2024-09-04 DIAGNOSIS — M79605 Pain in left leg: Secondary | ICD-10-CM | POA: Diagnosis not present

## 2024-09-04 DIAGNOSIS — M79604 Pain in right leg: Secondary | ICD-10-CM | POA: Diagnosis not present
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
## 2024-09-06 ENCOUNTER — Ambulatory Visit (HOSPITAL_COMMUNITY): Admitting: Physical Therapy

## 2024-09-06 DIAGNOSIS — R293 Abnormal posture: Secondary | ICD-10-CM | POA: Diagnosis not present

## 2024-09-06 DIAGNOSIS — M545 Low back pain, unspecified: Secondary | ICD-10-CM | POA: Diagnosis not present

## 2024-09-06 DIAGNOSIS — R262 Difficulty in walking, not elsewhere classified: Secondary | ICD-10-CM

## 2024-09-06 DIAGNOSIS — M79605 Pain in left leg: Secondary | ICD-10-CM | POA: Diagnosis not present

## 2024-09-06 DIAGNOSIS — M6281 Muscle weakness (generalized): Secondary | ICD-10-CM | POA: Diagnosis not present

## 2024-09-06 DIAGNOSIS — M79604 Pain in right leg: Secondary | ICD-10-CM | POA: Diagnosis not present
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
## 2024-09-11 ENCOUNTER — Ambulatory Visit (HOSPITAL_COMMUNITY)

## 2024-09-11 DIAGNOSIS — R262 Difficulty in walking, not elsewhere classified: Secondary | ICD-10-CM | POA: Diagnosis not present

## 2024-09-11 DIAGNOSIS — R293 Abnormal posture: Secondary | ICD-10-CM | POA: Diagnosis not present

## 2024-09-11 DIAGNOSIS — M6281 Muscle weakness (generalized): Secondary | ICD-10-CM | POA: Diagnosis not present

## 2024-09-11 DIAGNOSIS — M79605 Pain in left leg: Secondary | ICD-10-CM | POA: Diagnosis not present

## 2024-09-11 DIAGNOSIS — M545 Low back pain, unspecified: Secondary | ICD-10-CM

## 2024-09-11 DIAGNOSIS — M79604 Pain in right leg: Secondary | ICD-10-CM | POA: Diagnosis not present
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
## 2024-09-12 ENCOUNTER — Other Ambulatory Visit (HOSPITAL_COMMUNITY): Payer: Self-pay | Admitting: Family Medicine

## 2024-09-12 DIAGNOSIS — G8929 Other chronic pain: Secondary | ICD-10-CM | POA: Diagnosis not present

## 2024-09-12 DIAGNOSIS — M545 Low back pain, unspecified: Secondary | ICD-10-CM | POA: Diagnosis not present

## 2024-09-12 DIAGNOSIS — Z23 Encounter for immunization: Secondary | ICD-10-CM | POA: Diagnosis not present

## 2024-09-13 ENCOUNTER — Encounter (HOSPITAL_COMMUNITY): Admitting: Physical Therapy

## 2024-09-19 ENCOUNTER — Ambulatory Visit (HOSPITAL_COMMUNITY): Admitting: Physical Therapy

## 2024-09-19 DIAGNOSIS — M79605 Pain in left leg: Secondary | ICD-10-CM | POA: Diagnosis not present

## 2024-09-19 DIAGNOSIS — R262 Difficulty in walking, not elsewhere classified: Secondary | ICD-10-CM

## 2024-09-19 DIAGNOSIS — M6281 Muscle weakness (generalized): Secondary | ICD-10-CM | POA: Diagnosis not present

## 2024-09-19 DIAGNOSIS — M545 Low back pain, unspecified: Secondary | ICD-10-CM

## 2024-09-19 DIAGNOSIS — R293 Abnormal posture: Secondary | ICD-10-CM

## 2024-09-19 DIAGNOSIS — M79604 Pain in right leg: Secondary | ICD-10-CM
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
## 2024-09-20 ENCOUNTER — Ambulatory Visit (HOSPITAL_COMMUNITY)
Admission: RE | Admit: 2024-09-20 | Discharge: 2024-09-20 | Disposition: A | Discharge status: Home / Self Care | Source: Ambulatory Visit | Attending: Family Medicine | Admitting: Family Medicine

## 2024-09-20 DIAGNOSIS — M47816 Spondylosis without myelopathy or radiculopathy, lumbar region: Secondary | ICD-10-CM | POA: Diagnosis not present

## 2024-09-20 DIAGNOSIS — M545 Low back pain, unspecified: Secondary | ICD-10-CM | POA: Insufficient documentation

## 2024-09-20 DIAGNOSIS — K5730 Diverticulosis of large intestine without perforation or abscess without bleeding: Secondary | ICD-10-CM | POA: Diagnosis not present

## 2024-09-20 DIAGNOSIS — M4316 Spondylolisthesis, lumbar region: Secondary | ICD-10-CM | POA: Diagnosis not present

## 2024-09-20 DIAGNOSIS — M5136 Other intervertebral disc degeneration, lumbar region with discogenic back pain only: Secondary | ICD-10-CM | POA: Diagnosis not present

## 2024-09-21 ENCOUNTER — Ambulatory Visit (HOSPITAL_COMMUNITY): Attending: Family Medicine

## 2024-09-21 ENCOUNTER — Encounter (HOSPITAL_COMMUNITY): Payer: Self-pay

## 2024-09-21 DIAGNOSIS — M545 Low back pain, unspecified: Secondary | ICD-10-CM | POA: Diagnosis present

## 2024-09-21 DIAGNOSIS — M6281 Muscle weakness (generalized): Secondary | ICD-10-CM | POA: Insufficient documentation

## 2024-09-21 DIAGNOSIS — R262 Difficulty in walking, not elsewhere classified: Secondary | ICD-10-CM | POA: Diagnosis present

## 2024-09-21 DIAGNOSIS — M79604 Pain in right leg: Secondary | ICD-10-CM | POA: Insufficient documentation

## 2024-09-21 DIAGNOSIS — M79605 Pain in left leg: Secondary | ICD-10-CM | POA: Insufficient documentation
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
## 2024-09-27 DIAGNOSIS — M545 Low back pain, unspecified: Secondary | ICD-10-CM | POA: Diagnosis not present

## 2024-09-27 DIAGNOSIS — G8929 Other chronic pain: Secondary | ICD-10-CM | POA: Diagnosis not present

## 2024-10-02 ENCOUNTER — Other Ambulatory Visit (HOSPITAL_COMMUNITY): Payer: Self-pay | Admitting: Internal Medicine

## 2024-10-02 DIAGNOSIS — Z1231 Encounter for screening mammogram for malignant neoplasm of breast: Secondary | ICD-10-CM

## 2024-10-12 ENCOUNTER — Ambulatory Visit (INDEPENDENT_AMBULATORY_CARE_PROVIDER_SITE_OTHER): Admitting: "Endocrinology

## 2024-10-12 ENCOUNTER — Encounter: Payer: Self-pay | Admitting: "Endocrinology

## 2024-10-12 DIAGNOSIS — E782 Mixed hyperlipidemia: Secondary | ICD-10-CM | POA: Diagnosis not present

## 2024-10-12 DIAGNOSIS — D352 Benign neoplasm of pituitary gland: Secondary | ICD-10-CM | POA: Insufficient documentation

## 2024-10-12 DIAGNOSIS — E212 Other hyperparathyroidism: Secondary | ICD-10-CM | POA: Diagnosis not present

## 2024-10-12 DIAGNOSIS — M810 Age-related osteoporosis without current pathological fracture: Secondary | ICD-10-CM

## 2024-10-12 DIAGNOSIS — L83 Acanthosis nigricans: Secondary | ICD-10-CM | POA: Insufficient documentation

## 2024-10-12 DIAGNOSIS — R7303 Prediabetes: Secondary | ICD-10-CM | POA: Diagnosis not present

## 2024-10-12 NOTE — Progress Notes (Signed)
 Endocrinology Consult Note                                            10/12/2024, 1:20 PM   Subjective:    Patient ID: [name], female    DOB: 11-19-1956, PCP Shona Norleen PEDLAR, MD   Past Medical History:  Diagnosis Date   Anxiety    Arthritis    Asthma    Chronic knee pain    Diverticulitis    Dyspnea    with exertion   GERD (gastroesophageal reflux disease)    Headache    HSV infection    Hypertension    Low back pain 12/2023   Pneumonia    x several   Pre-diabetes    no meds, diet controlled   Seasonal allergies    Vertigo    Past Surgical History:  Procedure Laterality Date   ABDOMINAL HYSTERECTOMY     ABLATION COLPOCLESIS     CHOLECYSTECTOMY     COLONOSCOPY WITH PROPOFOL  N/A 10/04/2017   Procedure: COLONOSCOPY WITH PROPOFOL ;  Surgeon: Golda Claudis PENNER, MD;  Location: [facility];  Service: Endoscopy;  Laterality: N/A;  1:15   KNEE ARTHROSCOPY WITH LATERAL MENISECTOMY Right 04/29/2021   Procedure: RIGHT KNEE ARTHROSCOPY WITH LATERAL MENISCECTOMY;  Surgeon: Margrette Taft BRAVO, MD;  Location: [facility];  Service: Orthopedics;  Laterality: Right;   POLYPECTOMY  10/04/2017   Procedure: POLYPECTOMY;  Surgeon: Golda Claudis PENNER, MD;  Location: [facility];  Service: Endoscopy;;  sigmoid colon polyp   TOOTH EXTRACTION N/A 01/21/2024   Procedure: DENTAL RESTORATION/EXTRACTIONS NUMBER 6, 7, 8, 9, 10, 11, 32, AND ALVEOLOPLASTY;  Surgeon: Sheryle Hamilton, DMD;  Location: [facility];  Service: Oral Surgery;  Laterality: N/A;   Social History   Socioeconomic History   Marital status: Widowed    Spouse name: Not on file   Number of children: Not on file   Years of education: Not on file   Highest education level: Not on file  Occupational History   Occupation: Production line  Tobacco Use   Smoking status: Never   Smokeless tobacco: Never  Vaping Use   Vaping status: Never Used  Substance and Sexual Activity   Alcohol use: No   Drug use: No   Sexual activity:  Not Currently    Birth control/protection: Surgical    Comment: Hysterectomy  Other Topics Concern   Not on file  Social History Narrative   Not on file   Social Drivers of Health   Financial Resource Strain: Low Risk  (11/16/2023)   Overall Financial Resource Strain (CARDIA)    Difficulty of Paying Living Expenses: Not very hard  Food Insecurity: No Food Insecurity (11/16/2023)   Hunger Vital Sign    Worried About Running Out of Food in the Last Year: Never true    Ran Out of Food in the Last Year: Never true  Transportation Needs: No Transportation Needs (11/16/2023)   PRAPARE - Administrator, Civil Service (Medical): No    Lack of Transportation (Non-Medical): No  Physical Activity: Sufficiently Active (11/16/2023)   Exercise Vital Sign    Days of Exercise per Week: 7 days    Minutes of Exercise per Session: 60 min  Stress: No Stress Concern Present (11/16/2023)   [employer] of Occupational Health - Occupational Stress Questionnaire  Feeling of Stress : Not at all  Social Connections: Moderately Isolated (11/16/2023)   Social Connection and Isolation Panel    Frequency of Communication with Friends and Family: More than three times a week    Frequency of Social Gatherings with Friends and Family: Three times a week    Attends Religious Services: More than 4 times per year    Active Member of Clubs or Organizations: No    Attends Banker Meetings: Never    Marital Status: Widowed   Family History  Problem Relation Age of Onset   Stroke Father    Hypertension Father    Emphysema Mother    Stroke Brother    Hypertension Brother    Diabetes Sister    Hypertension Sister    Outpatient Encounter Medications as of 10/12/2024  Medication Sig   ascorbic acid (VITAMIN C) 500 MG tablet Take 500 mg by mouth daily.   methocarbamol  (ROBAXIN ) 500 MG tablet Take 1 tablet (500 mg total) by mouth 2 (two) times daily. (Patient taking differently:  Take 500 mg by mouth 4 (four) times daily.)   triamterene -hydrochlorothiazide (MAXZIDE) 75-50 MG tablet Take 1 tablet by mouth daily. (Patient taking differently: Take 0.5 tablets by mouth daily.)   albuterol  (PROVENTIL  HFA;VENTOLIN  HFA) 108 (90 Base) MCG/ACT inhaler Inhale 2 puffs into the lungs every 6 (six) hours as needed for wheezing or shortness of breath.    ALPRAZolam  (XANAX ) 1 MG tablet Take 2 mg by mouth at bedtime.   doxycycline  (VIBRAMYCIN ) 100 MG capsule Take 1 capsule (100 mg total) by mouth 2 (two) times daily. (Patient not taking: Reported on 10/12/2024)   fluorometholone (FML) 0.1 % ophthalmic suspension Place 1 drop into both eyes at bedtime.   fluticasone (FLOVENT HFA) 110 MCG/ACT inhaler Inhale 2 puffs into the lungs daily as needed (asthma).   HYDROcodone -acetaminophen  (NORCO/VICODIN) 5-325 MG tablet Take 1 tablet by mouth every 4 (four) hours as needed. (Patient not taking: Reported on 10/12/2024)   Multiple Vitamin (MULTIVITAMIN WITH MINERALS) TABS tablet Take 1 tablet by mouth daily.   Oxycodone  HCl 10 MG TABS Take 10 mg by mouth every 8 (eight) hours as needed (severe pain).   pantoprazole (PROTONIX) 40 MG tablet Take 1 tablet by mouth daily.   polyvinyl alcohol (LIQUIFILM TEARS) 1.4 % ophthalmic solution Place 1 drop into both eyes as needed for dry eyes.   triamcinolone  cream (KENALOG ) 0.1 % Apply 1 Application topically daily as needed (irritation).   valACYclovir (VALTREX) 500 MG tablet Take 1 tablet by mouth 2 (two) times daily as needed (outbreak).   [DISCONTINUED] predniSONE  (DELTASONE ) 50 MG tablet Take 1 tablet (50 mg total) by mouth daily with breakfast. (Patient not taking: Reported on 10/12/2024)   No facility-administered encounter medications on file as of 10/12/2024.   ALLERGIES: Allergies  Allergen Reactions   Gadolinium Derivatives Anaphylaxis    Throat swelling/ hospitalized after magnavist administered/ documented on mri report from 04/16/06 by Dr  Chandra   Iohexol Anaphylaxis        Benadryl  [Diphenhydramine ] Rash    Liquid. Pt states she can take pills.   Latex Other (See Comments)    Causes dark spots on skin, but not pain, or irritation.    Sulfa Antibiotics Rash and Other (See Comments)    Minor Shaking    VACCINATION STATUS: Immunization History  Administered Date(s) Administered   Tdap 05/31/2018    HPI [name] is 68 y.o. female who presents today with a medical  history as above. she is being seen in consultation for hypercalcemia requested by Shona Norleen PEDLAR, MD. Patient denies any prior history of parathyroid dysfunction.  In June 2025 she was found to have hypercalcemia of 11.9 associated with high PTH of 113.  This was a follow-up test due to hypercalcemia of 11.0 in May 2025.   Patient denies any family history of hypercalcemia nor family history of neck surgery to treat hypercalcemia. She denies any height loss, was recently found to have osteoporosis of right femoral neck, not on treatment. 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  She denies visual field deficit nor retro-orbital headaches.  She did not have endocrine hormone analysis. She has medical history of hypertension, COPD/asthma, diffuse arthralgias for which she is taking oxycodone , intermittent oral prednisone , hyperlipidemia, obesity with BMI of 40.56 kg/m, GERD. She has prediabetes with recent A1c of 5.9%.  Recent thyroid  function test were consistent with euthyroid presentation. Patient is not a smoker.  She did not have recent exposure to prednisone .   Review of Systems  Constitutional: +mildly fluctuating body weight, no fatigue, no subjective hyperthermia, no subjective hypothermia Eyes: no blurry vision, no xerophthalmia ENT: no sore throat, no nodules palpated in throat, no dysphagia/odynophagia, no hoarseness Cardiovascular: no Chest Pain, no  Shortness of Breath, no palpitations, no leg swelling Respiratory: no cough, no shortness of breath Gastrointestinal: no Nausea/Vomiting/Diarhhea Musculoskeletal: no muscle/joint aches Skin: no rashes Neurological: no tremors, no numbness, no tingling, no dizziness Psychiatric: no depression, no anxiety  Objective:       10/12/2024    9:39 AM 05/03/2024    9:46 PM 05/03/2024    5:30 PM  Vitals with BMI  Height 4' 11  5' 0  Weight 200 lbs 13 oz  200 lbs  BMI 40.53  39.06  Systolic 136 141   Diastolic 78 67   Pulse 88 53     BP 136/78   Pulse 88   Ht 4' 11 (1.499 m)   Wt 200 lb 12.8 oz (91.1 kg)   BMI 40.56 kg/m   Wt Readings from Last 3 Encounters:  10/12/24 200 lb 12.8 oz (91.1 kg)  05/03/24 200 lb (90.7 kg)  04/17/24 200 lb 9.9 oz (91 kg)    Physical Exam  Constitutional:  Body mass index is 40.56 kg/m.,  not in acute distress, normal state of mind Eyes: PERRLA, EOMI, no exophthalmos ENT: moist mucous membranes, no gross thyromegaly, no gross cervical lymphadenopathy Cardiovascular: normal precordial activity, Regular Rate and Rhythm, no Murmur/Rubs/Gallops Respiratory:  adequate breathing efforts, no gross chest deformity, Clear to auscultation bilaterally Gastrointestinal: abdomen soft, Non -tender, No distension, Bowel Sounds present, no gross organomegaly Musculoskeletal: no gross deformities,  no peripheral edema Skin: moist, warm, no rashes, + acanthosis nigricans Neurological: no tremor with outstretched hands, Deep tendon reflexes normal in bilateral lower extremities.  CMP ( most recent) CMP     Component Value Date/Time   NA 138 05/03/2024 1842   K 4.2 05/03/2024 1842   CL 103 05/03/2024 1842   CO2 24 05/03/2024 1825   GLUCOSE 92 05/03/2024 1842   BUN 13 05/03/2024 1842   CREATININE 1.30 (H) 05/03/2024 1842   CALCIUM 11.0 (H) 05/03/2024 1825   PROT 7.0 05/03/2024 1825   ALBUMIN 3.9 05/03/2024 1825   AST 42 (H) 05/03/2024 1825   ALT 23  05/03/2024 1825   ALKPHOS 94 05/03/2024 1825   BILITOT 0.9  05/03/2024 1825   GFRNONAA 45 (L) 05/03/2024 1825     Diabetic Labs (most recent): Lab Results  Component Value Date   HGBA1C 5.6 10/03/2019     Lipid Panel ( most recent) Lipid Panel     Component Value Date/Time   CHOL 211 (H) 10/03/2019 0855   TRIG 87 10/03/2019 0855   HDL 59 10/03/2019 0855   CHOLHDL 3.6 10/03/2019 0855   VLDL 17 10/03/2019 0855   LDLCALC 135 (H) 10/03/2019 0855      Lab Results  Component Value Date   TSH 2.075 11/02/2019   TSH 1.824 02/02/2018         May 2025 calcium 11.0, June 2025 calcium 11.9, PTH 113, ionized calcium elevated at 6.2.  TSH 4.3, free T40.80  Assessment & Plan:   1. Hypercalcemia (Primary) 2. Other hyperparathyroidism 3. Pituitary macroadenoma (HCC) 4. Osteoporosis, unspecified osteoporosis type, unspecified pathological fracture presence 5.  Metabolic syndrome with morbid obesity, prediabetes, hyperlipidemia  - [name]  is being seen at a kind request of Shona, Norleen PEDLAR, MD. - I have reviewed her available  records and clinically evaluated the patient. - Based on these reviews, she has PTH dependent hypercalcemia on at least 2 separate occasions with no prior history of documented parathyroid dysfunction, complicated by osteoporosis. Patient will need further workup to rule out hyperparathyroidism.  She will need repeat measurements of calcium/PTH, magnesium, phosphorus, and 24-hour urine calcium measurement.  She already has DEXA scan which showed evidence of bone loss with osteoporosis.  If she is confirmed to have primary hyperparathyroidism, she is going to be considered for surgical treatment. If she is not confirmed to have primary surgically amenable hyperparathyroidism, she will be considered for antiosteoporosis treatment.     Regarding her pituitary microadenoma: She will benefit from workup including measurements of prolactin, IGF-I, 24-hour urine  free cortisol.  She does not seem to have pressured signs and symptoms, however this patient will benefit from repeat MRI 6 months from the last one, which will be around December 2025.  This will be necessary facially if prolactinoma is ruled out.  If she returns with hyperprolactinemia, she will be treated with dopamine agonists first. - Insulin-like growth factor - Prolactin - Cortisol, urine, free; Future - Cortisol, urine, free - Comprehensive metabolic panel with GFR - VITAMIN D 25 Hydroxy (Vit-D Deficiency, Fractures) - PTH-related peptide - Phosphorus - PTH, intact and calcium - Magnesium - Creatinine, urine, 24 hour - Calcium, urine, 24 hour  In light of her metabolic syndrome, exercise and lifestyle nutrition was briefly discussed with her.  She will be considered for a more discussion next visit.  - she is advised to maintain close follow up with Shona Norleen PEDLAR, MD for primary care needs. -Thank you for involving me in the care of this pleasant patient.  Time spent with the patient: 63  minutes spent in  counseling her about hypercalcemia/hyperparathyroidism, pituitary macroadenoma, osteoporosis, metabolic syndrome and the rest in obtaining information about her symptoms, reviewing her previous labs/studies (including abstractions from other facilities),  evaluations, and treatments,  and developing a plan to confirm diagnosis and long term treatment based on the latest standards of care/guidelines; and documenting her care.  [name] participated in the discussions, expressed understanding, and voiced agreement with the above plans.  All questions were answered to her satisfaction. she is encouraged to contact clinic should she have any questions or concerns prior to her return visit.  Follow up plan:  Return in about 4 weeks (around 11/09/2024) for 24 Hr Urine Ca & Cr, 24 Hr Urine Free Cortisol & Cr, F/U with Pre-visit Labs.   Ranny Earl, MD [facility] [address] [place], [place] [address] Phone: [phone]  Fax: [phone]     10/12/2024, 1:20 PM  This note was partially dictated with voice recognition software. Similar sounding words can be transcribed inadequately or may not  be corrected upon review.

## 2024-10-19 LAB — CREATININE, URINE, 24 HOUR
Creatinine, 24H Ur: 1575 mg/(24.h) (ref 800–1800)
Creatinine, Urine: 75 mg/dL

## 2024-10-19 LAB — CALCIUM, URINE, 24 HOUR
Calcium, 24H Urine: 65 mg/(24.h) (ref 0–320)
Calcium, Urine: 3.1 mg/dL

## 2024-10-23 ENCOUNTER — Telehealth: Payer: Self-pay

## 2024-10-23 LAB — CORTISOL, URINE, FREE
Cortisol (Ur), Free: 13 ug/(24.h) (ref 6–42)
Cortisol,F,ug/L,U: 6 ug/L

## 2024-10-23 NOTE — Telephone Encounter (Signed)
Ok to schedule.  Room :Any   Thanks,  Vista Lawman, MD Gastroenterology and Hepatology [facility]

## 2024-10-23 NOTE — Telephone Encounter (Signed)
 Who is your primary care physician: Dr.Zach Shona  Reasons for the colonoscopy: Hx polyps  Have you had a colonoscopy before?  Yes   Do you have family history of colon cancer? no  Previous colonoscopy with polyps removed? yes  Do you have a history colorectal cancer?   no  Are you diabetic? If yes, Type 1 or Type 2?    no  Do you have a prosthetic or mechanical heart valve? no  Do you have a pacemaker/defibrillator?   no  Have you had endocarditis/atrial fibrillation? no  Have you had joint replacement within the last 12 months?  no  Do you tend to be constipated or have to use laxatives? no  Do you have any history of drugs or alchohol?  no  Do you use supplemental oxygen?  no  Have you had a stroke or heart attack within the last 6 months? no  Do you take weight loss medication?  no  For female patients: have you had a hysterectomy?  yes                                     are you post menopausal?       no                                            do you still have your menstrual cycle? no      Do you take any blood-thinning medications such as: (aspirin, warfarin, Plavix, Aggrenox)  no  If yes we need the name, milligram, dosage and who is prescribing doctor  Current Outpatient Medications on File Prior to Visit  Medication Sig Dispense Refill   ALPRAZolam  (XANAX ) 1 MG tablet Take 2 mg by mouth at bedtime.     beclomethasone (QVAR) 40 MCG/ACT inhaler Inhale into the lungs 2 (two) times daily.     ciclopirox (PENLAC) 8 % solution Apply topically at bedtime. Apply over nail and surrounding skin. Apply daily over previous coat. After seven (7) days, may remove with alcohol and continue cycle.     fluorometholone (FML) 0.1 % ophthalmic suspension Place 1 drop into both eyes at bedtime.     fluticasone (FLOVENT HFA) 110 MCG/ACT inhaler Inhale 2 puffs into the lungs daily as needed (asthma).     gabapentin (NEURONTIN) 100 MG capsule Take 100 mg by mouth 3 (three) times  daily.     HYDROcodone -acetaminophen  (NORCO/VICODIN) 5-325 MG tablet Take 1 tablet by mouth every 4 (four) hours as needed. 10 tablet 0   ibuprofen  (ADVIL ) 800 MG tablet Take 800 mg by mouth every 8 (eight) hours as needed.     Oxycodone  HCl 10 MG TABS Take 10 mg by mouth every 8 (eight) hours as needed (severe pain).     triamterene -hydrochlorothiazide (MAXZIDE) 75-50 MG tablet Take 1 tablet by mouth daily.     No current facility-administered medications on file prior to visit.    Allergies  Allergen Reactions   Gadolinium Derivatives Anaphylaxis    Throat swelling/ hospitalized after magnavist administered/ documented on mri report from 04/16/06 by Dr Chandra   Iohexol Anaphylaxis        Benadryl  [Diphenhydramine ] Rash    Liquid. Pt states she can take pills.   Latex Other (See Comments)    Causes dark  spots on skin, but not pain, or irritation.    Sulfa Antibiotics Rash and Other (See Comments)    Minor Shaking     Pharmacy: [facility] Name: [facility] [phone], [place] [phone]  Best number where you can be reached: [phone]

## 2024-10-24 ENCOUNTER — Other Ambulatory Visit: Payer: Self-pay | Admitting: *Deleted

## 2024-10-24 ENCOUNTER — Encounter: Payer: Self-pay | Admitting: *Deleted

## 2024-10-24 MED ORDER — PEG 3350-KCL-NA BICARB-NACL 420 G PO SOLR: 4000.0000 mL | Freq: Once | ORAL | 0 refills | Status: AC

## 2024-10-24 NOTE — Telephone Encounter (Signed)
 Pt has been scheduled for 11/23/24, instructions mailed and prep sent to pharmacy.

## 2024-10-24 NOTE — Telephone Encounter (Signed)
 Questionnaire from recall, no referral needed

## 2024-10-25 ENCOUNTER — Encounter (HOSPITAL_COMMUNITY): Payer: Self-pay

## 2024-10-25 ENCOUNTER — Ambulatory Visit (HOSPITAL_COMMUNITY)
Admission: RE | Admit: 2024-10-25 | Discharge: 2024-10-25 | Disposition: A | Discharge status: Home / Self Care | Source: Ambulatory Visit | Attending: Internal Medicine | Admitting: Internal Medicine

## 2024-10-25 DIAGNOSIS — Z1231 Encounter for screening mammogram for malignant neoplasm of breast: Secondary | ICD-10-CM | POA: Diagnosis present

## 2024-10-28 LAB — COMPREHENSIVE METABOLIC PANEL WITH GFR
ALT: 16 IU/L (ref 0–32)
AST: 22 IU/L (ref 0–40)
Albumin: 4.4 g/dL (ref 3.9–4.9)
Alkaline Phosphatase: 163 IU/L — ABNORMAL HIGH (ref 49–135)
BUN/Creatinine Ratio: 9 — ABNORMAL LOW (ref 12–28)
BUN: 11 mg/dL (ref 8–27)
Bilirubin Total: 0.3 mg/dL (ref 0.0–1.2)
CO2: 24 mmol/L (ref 20–29)
Calcium: 12.4 mg/dL — ABNORMAL HIGH (ref 8.7–10.3)
Chloride: 105 mmol/L (ref 96–106)
Creatinine, Ser: 1.27 mg/dL — ABNORMAL HIGH (ref 0.57–1.00)
Globulin, Total: 2.6 g/dL (ref 1.5–4.5)
Glucose: 88 mg/dL (ref 70–99)
Potassium: 4.1 mmol/L (ref 3.5–5.2)
Sodium: 144 mmol/L (ref 134–144)
Total Protein: 7 g/dL (ref 6.0–8.5)
eGFR: 45 mL/min/1.73 — ABNORMAL LOW (ref >59)

## 2024-10-28 LAB — VITAMIN D 25 HYDROXY (VIT D DEFICIENCY, FRACTURES): Vit D, 25-Hydroxy: 39.2 ng/mL (ref 30.0–100.0)

## 2024-10-28 LAB — PTH, INTACT AND CALCIUM: PTH: 88 pg/mL — ABNORMAL HIGH (ref 15–65)

## 2024-10-28 LAB — PROLACTIN: Prolactin: 10.9 ng/mL (ref 3.6–25.2)

## 2024-10-28 LAB — PHOSPHORUS: Phosphorus: 2.9 mg/dL — ABNORMAL LOW (ref 3.0–4.3)

## 2024-10-28 LAB — MAGNESIUM: Magnesium: 1.9 mg/dL (ref 1.6–2.3)

## 2024-10-28 LAB — PTH-RELATED PEPTIDE: PTH-related peptide: <2 pmol/L

## 2024-10-28 LAB — INSULIN-LIKE GROWTH FACTOR: Insulin-Like GF-1: 98 ng/mL (ref 52–196)

## 2024-11-09 ENCOUNTER — Ambulatory Visit: Admitting: "Endocrinology

## 2024-11-09 ENCOUNTER — Encounter: Payer: Self-pay | Admitting: "Endocrinology

## 2024-11-09 DIAGNOSIS — D352 Benign neoplasm of pituitary gland: Secondary | ICD-10-CM | POA: Diagnosis not present

## 2024-11-09 DIAGNOSIS — E212 Other hyperparathyroidism: Secondary | ICD-10-CM | POA: Diagnosis not present

## 2024-11-09 DIAGNOSIS — M810 Age-related osteoporosis without current pathological fracture: Secondary | ICD-10-CM | POA: Diagnosis not present

## 2024-11-09 DIAGNOSIS — L83 Acanthosis nigricans: Secondary | ICD-10-CM

## 2024-11-09 DIAGNOSIS — E782 Mixed hyperlipidemia: Secondary | ICD-10-CM | POA: Diagnosis not present

## 2024-11-09 DIAGNOSIS — R7303 Prediabetes: Secondary | ICD-10-CM

## 2024-11-09 MED ORDER — CINACALCET HCL 30 MG PO TABS
30.0000 mg | ORAL_TABLET | Freq: Every day | ORAL | 1 refills | Status: AC
Start: 2024-11-09 — End: ?

## 2024-11-09 NOTE — Progress Notes (Signed)
 11/09/2024, 12:54 PM  Endocrinology follow-up note   Subjective:    Patient ID: [name], female    DOB: 1956/02/23, PCP Shona Norleen PEDLAR, MD   Past Medical History:  Diagnosis Date   Anxiety    Arthritis    Asthma    Chronic knee pain    Diverticulitis    Dyspnea    with exertion   GERD (gastroesophageal reflux disease)    Headache    HSV infection    Hypertension    Low back pain 12/2023   Pneumonia    x several   Pre-diabetes    no meds, diet controlled   Seasonal allergies    Vertigo    Past Surgical History:  Procedure Laterality Date   ABDOMINAL HYSTERECTOMY     ABLATION COLPOCLESIS     CHOLECYSTECTOMY     COLONOSCOPY WITH PROPOFOL  N/A 10/04/2017   Procedure: COLONOSCOPY WITH PROPOFOL ;  Surgeon: Golda Claudis PENNER, MD;  Location: [facility];  Service: Endoscopy;  Laterality: N/A;  1:15   KNEE ARTHROSCOPY WITH LATERAL MENISECTOMY Right 04/29/2021   Procedure: RIGHT KNEE ARTHROSCOPY WITH LATERAL MENISCECTOMY;  Surgeon: Margrette Taft BRAVO, MD;  Location: [facility];  Service: Orthopedics;  Laterality: Right;   POLYPECTOMY  10/04/2017   Procedure: POLYPECTOMY;  Surgeon: Golda Claudis PENNER, MD;  Location: [facility];  Service: Endoscopy;;  sigmoid colon polyp   TOOTH EXTRACTION N/A 01/21/2024   Procedure: DENTAL RESTORATION/EXTRACTIONS NUMBER 6, 7, 8, 9, 10, 11, 32, AND ALVEOLOPLASTY;  Surgeon: Sheryle Hamilton, DMD;  Location: [facility];  Service: Oral Surgery;  Laterality: N/A;   Social History   Socioeconomic History   Marital status: Widowed    Spouse name: Not on file   Number of children: Not on file   Years of education: Not on file   Highest education level: Not on file  Occupational History   Occupation: Production line  Tobacco Use   Smoking status: Never   Smokeless tobacco: Never  Vaping Use   Vaping status: Never Used  Substance and Sexual Activity   Alcohol use: No   Drug use: No   Sexual  activity: Not Currently    Birth control/protection: Surgical    Comment: Hysterectomy  Other Topics Concern   Not on file  Social History Narrative   Not on file   Social Drivers of Health   Financial Resource Strain: Low Risk  (11/16/2023)   Overall Financial Resource Strain (CARDIA)    Difficulty of Paying Living Expenses: Not very hard  Food Insecurity: No Food Insecurity (11/16/2023)   Hunger Vital Sign    Worried About Running Out of Food in the Last Year: Never true    Ran Out of Food in the Last Year: Never true  Transportation Needs: No Transportation Needs (11/16/2023)   PRAPARE - Administrator, Civil Service (Medical): No    Lack of Transportation (Non-Medical): No  Physical Activity: Sufficiently Active (11/16/2023)   Exercise Vital Sign    Days of Exercise per Week: 7 days    Minutes of Exercise per Session: 60 min  Stress: No Stress Concern Present (11/16/2023)   [employer] of Occupational Health - Occupational Stress  Questionnaire    Feeling of Stress : Not at all  Social Connections: Moderately Isolated (11/16/2023)   Social Connection and Isolation Panel    Frequency of Communication with Friends and Family: More than three times a week    Frequency of Social Gatherings with Friends and Family: Three times a week    Attends Religious Services: More than 4 times per year    Active Member of Clubs or Organizations: No    Attends Banker Meetings: Never    Marital Status: Widowed   Family History  Problem Relation Age of Onset   Stroke Father    Hypertension Father    Emphysema Mother    Stroke Brother    Hypertension Brother    Diabetes Sister    Hypertension Sister    Outpatient Encounter Medications as of 11/09/2024  Medication Sig   cinacalcet (SENSIPAR) 30 MG tablet Take 1 tablet (30 mg total) by mouth daily with breakfast.   ALPRAZolam  (XANAX ) 1 MG tablet Take 2 mg by mouth at bedtime.   beclomethasone (QVAR) 40  MCG/ACT inhaler Inhale into the lungs 2 (two) times daily.   ciclopirox (PENLAC) 8 % solution Apply topically at bedtime. Apply over nail and surrounding skin. Apply daily over previous coat. After seven (7) days, may remove with alcohol and continue cycle.   fluorometholone (FML) 0.1 % ophthalmic suspension Place 1 drop into both eyes at bedtime.   fluticasone (FLOVENT HFA) 110 MCG/ACT inhaler Inhale 2 puffs into the lungs daily as needed (asthma).   gabapentin (NEURONTIN) 100 MG capsule Take 100 mg by mouth 3 (three) times daily.   HYDROcodone -acetaminophen  (NORCO/VICODIN) 5-325 MG tablet Take 1 tablet by mouth every 4 (four) hours as needed.   ibuprofen  (ADVIL ) 800 MG tablet Take 800 mg by mouth every 8 (eight) hours as needed.   Oxycodone  HCl 10 MG TABS Take 10 mg by mouth every 8 (eight) hours as needed (severe pain).   triamterene -hydrochlorothiazide (MAXZIDE) 75-50 MG tablet Take 1 tablet by mouth daily.   No facility-administered encounter medications on file as of 11/09/2024.   ALLERGIES: Allergies  Allergen Reactions   Gadolinium Derivatives Anaphylaxis    Throat swelling/ hospitalized after magnavist administered/ documented on mri report from 04/16/06 by Dr Chandra   Iohexol Anaphylaxis        Benadryl  [email or web address] ] Rash    Liquid. Pt states she can take pills.   Latex Other (See Comments)    Causes dark spots on skin, but not pain, or irritation.    Sulfa Antibiotics Rash and Other (See Comments)    Minor Shaking    VACCINATION STATUS: Immunization History  Administered Date(s) Administered   INFLUENZA, HIGH DOSE SEASONAL PF 09/12/2024   Tdap 05/31/2018    HPI [name] is 68 y.o. female who presents today with a medical history as above. she is being seen in follow-up after she was seen in consultation for hypercalcemia requested by Shona Norleen PEDLAR, MD.     In June 2025 she was found to have hypercalcemia of 11.9 associated with high PTH of 113.  This was a  follow-up test due to hypercalcemia of 11.0 in May 2025.    She presents with repeat labs showing even higher calcium level at 12.4 associated with high PTH of 88.  She is vitamin D replete.  24-hour urine calcium was not elevated at 65 mg per 24 hours.  Patient denies any family history of hypercalcemia nor family history of neck surgery  to treat hypercalcemia. She denies any height loss, was recently found to have osteoporosis of right femoral neck, not on treatment.  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  She denies visual field deficit nor retro-orbital headaches.    Her endocrine assessment of this adenoma was negative. She has medical history of hypertension, COPD/asthma, diffuse arthralgias for which she is taking oxycodone , intermittent oral prednisone , hyperlipidemia, obesity with BMI of 40.56 kg/m, GERD. She has prediabetes with recent A1c of 5.9%.  Recent thyroid  function test were consistent with euthyroid presentation. Patient is not a smoker.  She did not have recent exposure to prednisone .   Review of Systems  Constitutional: +mildly fluctuating body weight, no fatigue, no subjective hyperthermia, no subjective hypothermia Eyes: no blurry vision, no xerophthalmia   Objective:       11/09/2024   10:04 AM 10/12/2024    9:39 AM 05/03/2024    9:46 PM  Vitals with BMI  Height 4' 11 4' 11   Weight 198 lbs 13 oz 200 lbs 13 oz   BMI 40.13 40.53   Systolic 126 136 858  Diastolic 66 78 67  Pulse 80 88 53    BP 126/66   Pulse 80   Ht 4' 11 (1.499 m)   Wt 198 lb 12.8 oz (90.2 kg)   BMI 40.15 kg/m   Wt Readings from Last 3 Encounters:  11/09/24 198 lb 12.8 oz (90.2 kg)  10/12/24 200 lb 12.8 oz (91.1 kg)  05/03/24 200 lb (90.7 kg)    Physical Exam  Constitutional:  Body mass index is 40.15 kg/m.,  not in acute distress, normal state of mind Eyes: PERRLA, EOMI, no  exophthalmos ENT: moist mucous membranes, no gross thyromegaly, no gross cervical lymphadenopathy  Musculoskeletal: no gross deformities,  no peripheral edema Skin: moist, warm, no rashes, + acanthosis nigricans Neurological: no tremor with outstretched hands, Deep tendon reflexes normal in bilateral lower extremities.  CMP ( most recent) CMP     Component Value Date/Time   NA 144 10/18/2024 1433   K 4.1 10/18/2024 1433   CL 105 10/18/2024 1433   CO2 24 10/18/2024 1433   GLUCOSE 88 10/18/2024 1433   GLUCOSE 92 05/03/2024 1842   BUN 11 10/18/2024 1433   CREATININE 1.27 (H) 10/18/2024 1433   CALCIUM 12.4 (H) 10/18/2024 1433   PROT 7.0 10/18/2024 1433   ALBUMIN 4.4 10/18/2024 1433   AST 22 10/18/2024 1433   ALT 16 10/18/2024 1433   ALKPHOS 163 (H) 10/18/2024 1433   BILITOT 0.3 10/18/2024 1433   EGFR 45 (L) 10/18/2024 1433   GFRNONAA 45 (L) 05/03/2024 1825     Diabetic Labs (most recent): Lab Results  Component Value Date   HGBA1C 5.6 10/03/2019     Lipid Panel ( most recent) Lipid Panel     Component Value Date/Time   CHOL 211 (H) 10/03/2019 0855   TRIG 87 10/03/2019 0855   HDL 59 10/03/2019 0855   CHOLHDL 3.6 10/03/2019 0855   VLDL 17 10/03/2019 0855   LDLCALC 135 (H) 10/03/2019 0855      Lab Results  Component Value Date   TSH 2.075 11/02/2019   TSH 1.824 02/02/2018         May 2025 calcium 11.0, June 2025 calcium 11.9, PTH 113, ionized calcium elevated at 6.2.  TSH 4.3, free T40.80  Recent Results (from the past 2160 hours)  Cortisol, urine, free  Status: None   Collection Time: 10/17/24 10:30 AM  Result Value Ref Range   Cortisol,F,ug/L,U 6 Undefined ug/L   Cortisol (Ur), Free 13 6 - 42 ug/24 hr  Creatinine, urine, 24 hour     Status: None   Collection Time: 10/17/24 10:30 AM  Result Value Ref Range   Creatinine, Urine 75.0 Not Estab. mg/dL   Creatinine, 75Y Ur 8,424 800 - 1,800 mg/24 hr  Calcium, urine, 24 hour     Status: None   Collection  Time: 10/17/24 10:30 AM  Result Value Ref Range   Calcium, Urine 3.1 Not Estab. mg/dL    Comment: Specimen received without preservative. Value may be decreased. Consider recollection with preservative if clinical correlation indicated.    Calcium, 24H Urine 65 0 - 320 mg/24 hr  Comprehensive metabolic panel with GFR     Status: Abnormal   Collection Time: 10/18/24  2:33 PM  Result Value Ref Range   Glucose 88 70 - 99 mg/dL   BUN 11 8 - 27 mg/dL   Creatinine, Ser 8.72 (H) 0.57 - 1.00 mg/dL   eGFR 45 (L) >40 fO/fpw/8.26   BUN/Creatinine Ratio 9 (L) 12 - 28   Sodium 144 134 - 144 mmol/L   Potassium 4.1 3.5 - 5.2 mmol/L   Chloride 105 96 - 106 mmol/L   CO2 24 20 - 29 mmol/L   Calcium 12.4 (H) 8.7 - 10.3 mg/dL   Total Protein 7.0 6.0 - 8.5 g/dL   Albumin 4.4 3.9 - 4.9 g/dL   Globulin, Total 2.6 1.5 - 4.5 g/dL   Bilirubin Total 0.3 0.0 - 1.2 mg/dL   Alkaline Phosphatase 163 (H) 49 - 135 IU/L   AST 22 0 - 40 IU/L   ALT 16 0 - 32 IU/L  VITAMIN D  25 Hydroxy (Vit-D Deficiency, Fractures)     Status: None   Collection Time: 10/18/24  2:33 PM  Result Value Ref Range   Vit D, 25-Hydroxy 39.2 30.0 - 100.0 ng/mL    Comment: Vitamin D  deficiency has been defined by the Institute of Medicine and an Endocrine Society practice guideline as a level of serum 25-OH vitamin D  less than 20 ng/mL (1,2). The Endocrine Society went on to further define vitamin D  insufficiency as a level between 21 and 29 ng/mL (2). 1. IOM (Institute of Medicine). 2010. Dietary reference    intakes for calcium and D. [place]: The    [employer]. 2. Holick MF, Binkley Cornelius, Bischoff-Ferrari HA, et al.    Evaluation, treatment, and prevention of vitamin D     deficiency: an Endocrine Society clinical practice    guideline. JCEM. 2011 Jul; 96(7):1911-30.   Insulin -like growth factor     Status: None   Collection Time: 10/18/24  2:33 PM  Result Value Ref Range   Insulin -Like GF-1 98 52 - 196 ng/mL   Prolactin     Status: None   Collection Time: 10/18/24  2:33 PM  Result Value Ref Range   Prolactin 10.9 3.6 - 25.2 ng/mL  PTH-related peptide     Status: None   Collection Time: 10/18/24  2:33 PM  Result Value Ref Range   PTH-related peptide <2.0 pmol/L    Comment: This test was developed and its performance characteristics determined by [facility]. It has not been cleared or approved by the Food and Drug Administration. Reference Range: All Ages: <2.0 The PTHrP assay should not be used to exclude cancer or screen tumor patients for humoral hypercalcemia of  malignancy (HHM). The results should always be assessed in conjunction with the patient's medical history, clinical examination, and other findings. If test results are clinically discordant, please contact the laboratory.   Phosphorus     Status: Abnormal   Collection Time: 10/18/24  2:33 PM  Result Value Ref Range   Phosphorus 2.9 (L) 3.0 - 4.3 mg/dL  PTH, intact and calcium     Status: Abnormal   Collection Time: 10/18/24  2:33 PM  Result Value Ref Range   PTH 88 (H) 15 - 65 pg/mL   PTH Interp Comment     Comment: Interpretation                 Intact PTH    Calcium                                 (pg/mL)      (mg/dL) Normal                          15 - 65     8.6 - 10.2 Primary Hyperparathyroidism         >65          >10.2 Secondary Hyperparathyroidism       >65          <10.2 Non-Parathyroid Hypercalcemia       <65          >10.2 Hypoparathyroidism                  <15          < 8.6 Non-Parathyroid Hypocalcemia    15 - 65          < 8.6   Magnesium     Status: None   Collection Time: 10/18/24  2:33 PM  Result Value Ref Range   Magnesium 1.9 1.6 - 2.3 mg/dL     Assessment & Plan:   1. Hypercalcemia (Primary) 2. hyperparathyroidism 3. Pituitary macroadenoma (HCC) 4. Osteoporosis, unspecified osteoporosis type 5.  Metabolic syndrome with morbid obesity, prediabetes, hyperlipidemia  - I have reviewed her  new and available  records and clinically evaluated the patient. - Based on these reviews, she has PTH dependent hypercalcemia with her most recent labs showing worsening hypercalcemia of 12.4 mg per DL.  Triggering a possibility of primary hyperparathyroidism complicated by osteoporosis. 24-hour urine calcium is low at 39 mg per 24 hours.    She already has DEXA scan which showed evidence of bone loss with osteoporosis.    - She is not interested to explore surgical treatment options.  She would like to delay treatment for osteoporosis, however she needs treatment to control hypercalcemia.  I discussed and initiated Sensipar  30 mg p.o. daily at breakfast until next measurement in 4 months.    Regarding her pituitary microadenoma: Her endocrine evaluation is negative, will not need any intervention at this time.  No hyperprolactinemia.   She does not seem to have pressured signs and symptoms, however this patient will benefit from repeat MRI 6 months from the last one, which will be around December 2025.    In light of her metabolic syndrome, exercise and lifestyle medicine nutrition was briefly discussed with her.  She will be considered for a more discussion next visit.  - she is advised to maintain close follow up with Shona Norleen PEDLAR, MD for primary care needs.  I spent  26  minutes in the care of the patient today including review of labs from Thyroid  Function, CMP, and other relevant labs ; imaging/biopsy records (current and previous including abstractions from other facilities); face-to-face time discussing  her lab results and symptoms, medications doses, her options of short and long term treatment based on the latest standards of care / guidelines;   and documenting the encounter.  [name]  participated in the discussions, expressed understanding, and voiced agreement with the above plans.  All questions were answered to her satisfaction. she is encouraged to contact clinic should  she have any questions or concerns prior to her return visit.   Follow up plan: Return in about 4 months (around 03/09/2025) for F/U with Pre-visit Labs.   Ranny Earl, MD [facility] [address] [place], [place] [address] Phone: [phone]  Fax: [phone]     11/09/2024, 12:54 PM  This note was partially dictated with voice recognition software. Similar sounding words can be transcribed inadequately or may not  be corrected upon review.

## 2024-11-20 ENCOUNTER — Encounter (HOSPITAL_COMMUNITY)
Admission: RE | Admit: 2024-11-20 | Discharge: 2024-11-20 | Disposition: A | Discharge status: Home / Self Care | Source: Ambulatory Visit | Attending: Gastroenterology | Admitting: Gastroenterology

## 2024-11-20 ENCOUNTER — Encounter (HOSPITAL_COMMUNITY): Payer: Self-pay

## 2024-11-23 ENCOUNTER — Encounter (HOSPITAL_COMMUNITY): Payer: Self-pay | Admitting: Gastroenterology

## 2024-11-23 ENCOUNTER — Encounter (HOSPITAL_COMMUNITY): Admission: RE | Disposition: A | Payer: Self-pay | Source: Home / Self Care | Attending: Gastroenterology

## 2024-11-23 ENCOUNTER — Ambulatory Visit (HOSPITAL_COMMUNITY)

## 2024-11-23 ENCOUNTER — Ambulatory Visit (HOSPITAL_COMMUNITY)
Admission: RE | Admit: 2024-11-23 | Discharge: 2024-11-23 | Disposition: A | Attending: Gastroenterology | Admitting: Gastroenterology

## 2024-11-23 ENCOUNTER — Other Ambulatory Visit: Payer: Self-pay

## 2024-11-23 DIAGNOSIS — D120 Benign neoplasm of cecum: Secondary | ICD-10-CM

## 2024-11-23 DIAGNOSIS — D123 Benign neoplasm of transverse colon: Secondary | ICD-10-CM

## 2024-11-23 DIAGNOSIS — K648 Other hemorrhoids: Secondary | ICD-10-CM

## 2024-11-23 DIAGNOSIS — Z1211 Encounter for screening for malignant neoplasm of colon: Secondary | ICD-10-CM

## 2024-11-23 DIAGNOSIS — K635 Polyp of colon: Secondary | ICD-10-CM

## 2024-11-23 DIAGNOSIS — Z860100 Personal history of colon polyps, unspecified: Secondary | ICD-10-CM

## 2024-11-23 DIAGNOSIS — D124 Benign neoplasm of descending colon: Secondary | ICD-10-CM | POA: Diagnosis not present

## 2024-11-23 DIAGNOSIS — K5730 Diverticulosis of large intestine without perforation or abscess without bleeding: Secondary | ICD-10-CM | POA: Diagnosis not present

## 2024-11-23 DIAGNOSIS — D175 Benign lipomatous neoplasm of intra-abdominal organs: Secondary | ICD-10-CM

## 2024-11-23 HISTORY — PX: COLONOSCOPY: SHX5424

## 2024-11-23 HISTORY — PX: POLYPECTOMY: SHX149

## 2024-11-23 LAB — HM COLONOSCOPY

## 2024-11-23 SURGERY — COLONOSCOPY
Anesthesia: General

## 2024-11-23 MED ORDER — LACTATED RINGERS IV SOLN: INTRAVENOUS | Status: DC | PRN

## 2024-11-23 MED ORDER — VASOPRESSIN 20 UNIT/ML IV SOLN
INTRAVENOUS | Status: DC | PRN
  Administered 2024-11-23 (×2): 1 [IU] via INTRAVENOUS

## 2024-11-23 MED ORDER — PROPOFOL 500 MG/50ML IV EMUL
INTRAVENOUS | Status: DC | PRN
  Administered 2024-11-23: 200 ug/kg/min via INTRAVENOUS

## 2024-11-23 MED ORDER — LIDOCAINE 2% (20 MG/ML) 5 ML SYRINGE
INTRAMUSCULAR | Status: DC | PRN
  Administered 2024-11-23: 80 mg via INTRAVENOUS

## 2024-11-23 MED ORDER — PROPOFOL 10 MG/ML IV BOLUS
INTRAVENOUS | Status: DC | PRN
  Administered 2024-11-23: 50 mg via INTRAVENOUS

## 2024-11-23 NOTE — Op Note (Signed)
 [facility] Patient Name: [name] Procedure Date: 11/23/2024 9:12 AM MRN: [id number] Date of Birth: [birth year] Attending MD: Deatrice Dine , MD, [id number] CSN: [id number] Age: 68 Admit Type: Outpatient Procedure:                Colonoscopy Indications:              High risk colon cancer surveillance: Personal                            history of colonic polyps Providers:                Deatrice Dine, MD, Harlene Lips, Dorcas Lenis, Technician Referring MD:              Medicines:                Monitored Anesthesia Care Complications:            No immediate complications. Estimated Blood Loss:     Estimated blood loss was minimal. Procedure:                Pre-Anesthesia Assessment:                           - Prior to the procedure, a History and Physical                            was performed, and patient medications and                            allergies were reviewed. The patient's tolerance of                            previous anesthesia was also reviewed. The risks                            and benefits of the procedure and the sedation                            options and risks were discussed with the patient.                            All questions were answered, and informed consent                            was obtained. Prior Anticoagulants: The patient has                            taken no anticoagulant or antiplatelet agents. ASA                            Grade Assessment: II - A patient with mild systemic  disease. After reviewing the risks and benefits,                            the patient was deemed in satisfactory condition to                            undergo the procedure.                           After obtaining informed consent, the colonoscope                            was passed under direct vision. Throughout the                            procedure, the patient's  blood pressure, pulse, and                            oxygen saturations were monitored continuously. The                            [id number]-[id number] ([id number]) Peds Colon was introduced                            through the anus and advanced to the the cecum,                            identified by appendiceal orifice and ileocecal                            valve. The colonoscopy was performed without                            difficulty. The patient tolerated the procedure                            well. The quality of the bowel preparation was                            evaluated using the BBPS [facility] Bowel Preparation                            Scale) with scores of: Right Colon = 3, Transverse                            Colon = 3 and Left Colon = 3 (entire mucosa seen                            well with no residual staining, small fragments of                            stool or opaque liquid). The total BBPS score  equals 9. The ileocecal valve, appendiceal orifice,                            and rectum were photographed. Scope In: 9:27:32 AM Scope Out: 9:42:29 AM Scope Withdrawal Time: 0 hours 10 minutes 57 seconds  Total Procedure Duration: 0 hours 14 minutes 57 seconds  Findings:      Three sessile polyps were found in the descending colon, transverse       colon and cecum. The polyps were 3 to 5 mm in size. These polyps were       removed with a cold snare. Resection and retrieval were complete.      There was a small lipoma, in the sigmoid colon.      Scattered small-mouthed diverticula were found in the entire colon.      Non-bleeding internal hemorrhoids were found during retroflexion. The       hemorrhoids were small. Impression:               - Three 3 to 5 mm polyps in the descending colon,                            in the transverse colon and in the cecum, removed                            with a cold snare. Resected and retrieved.                            - Small lipoma in the sigmoid colon.                           - Diverticulosis in the entire examined colon.                           - Non-bleeding internal hemorrhoids. Moderate Sedation:      Per Anesthesia Care Recommendation:           - Patient has a contact number available for                            emergencies. The signs and symptoms of potential                            delayed complications were discussed with the                            patient. Return to normal activities tomorrow.                            Written discharge instructions were provided to the                            patient.                           - Resume previous diet.                           -  Continue present medications.                           - Await pathology results.                           - Repeat colonoscopy in 5 years for surveillance                            based on pathology results.                           - Return to primary care physician as previously                            scheduled. Procedure Code(s):        --- Professional ---                           [id number], Colonoscopy, flexible; with removal of                            tumor(s), polyp(s), or other lesion(s) by snare                            technique Diagnosis Code(s):        --- Professional ---                           Z86.010, Personal history of colonic polyps                           D12.4, Benign neoplasm of descending colon                           D12.3, Benign neoplasm of transverse colon (hepatic                            flexure or splenic flexure)                           D12.0, Benign neoplasm of cecum                           D17.5, Benign lipomatous neoplasm of                            intra-abdominal organs                           K64.8, Other hemorrhoids                           K57.30, Diverticulosis of large intestine without                             perforation or abscess without bleeding CPT copyright 2022 American Medical Association. All rights reserved.  The codes documented in this report are preliminary and upon coder review may  be revised to meet current compliance requirements. Deatrice Dine, MD Deatrice Dine, MD 11/23/2024 9:48:17 AM This report has been signed electronically. Number of Addenda: 0

## 2024-11-23 NOTE — Transfer of Care (Signed)
 Immediate Anesthesia Transfer of Care Note  Patient: [name]  Procedure(s) Performed: COLONOSCOPY POLYPECTOMY, INTESTINE  Patient Location: Short Stay  Anesthesia Type:General  Level of Consciousness: awake, alert , oriented, and patient cooperative  Airway & Oxygen Therapy: Patient Spontanous Breathing  Post-op Assessment: Report given to RN, Post -op Vital signs reviewed and stable, and Patient moving all extremities X 4  Post vital signs: Reviewed and stable  Last Vitals:  Vitals Value Taken Time  BP 91/48 11/23/24 09:57  Temp 36.7 C 11/23/24 09:57  Pulse 77 11/23/24 09:57  Resp 14 11/23/24 09:57  SpO2 96 % 11/23/24 09:57    Last Pain:  Vitals:   11/23/24 0957  TempSrc: Axillary  PainSc: 0-No pain         Complications: No notable events documented.

## 2024-11-23 NOTE — Anesthesia Preprocedure Evaluation (Signed)
 Anesthesia Evaluation  Patient identified by MRN, date of birth, ID band Patient awake    Reviewed: Allergy & Precautions, H&P , NPO status , Patient's Chart, lab work & pertinent test results  Airway Mallampati: II  TM Distance: >3 FB Neck ROM: Full    Dental no notable dental hx.    Pulmonary shortness of breath, asthma , pneumonia   Pulmonary exam normal breath sounds clear to auscultation       Cardiovascular hypertension, Normal cardiovascular exam Rhythm:Regular Rate:Normal     Neuro/Psych  Headaches  Anxiety      negative psych ROS   GI/Hepatic Neg liver ROS,GERD  ,,  Endo/Other    Class 3 obesity  Renal/GU Renal disease  negative genitourinary   Musculoskeletal  (+) Arthritis ,    Abdominal   Peds negative pediatric ROS (+)  Hematology negative hematology ROS (+)   Anesthesia Other Findings   Reproductive/Obstetrics negative OB ROS                              Anesthesia Physical Anesthesia Plan  ASA: 2  Anesthesia Plan: General   Post-op Pain Management:    Induction: Intravenous  PONV Risk Score and Plan:   Airway Management Planned: Nasal Cannula  Additional Equipment:   Intra-op Plan:   Post-operative Plan:   Informed Consent: I have reviewed the patients History and Physical, chart, labs and discussed the procedure including the risks, benefits and alternatives for the proposed anesthesia with the patient or authorized representative who has indicated his/her understanding and acceptance.     Dental advisory given  Plan Discussed with: CRNA  Anesthesia Plan Comments:         Anesthesia Quick Evaluation

## 2024-11-23 NOTE — Anesthesia Postprocedure Evaluation (Signed)
 Anesthesia Post Note  Patient: [name]  Procedure(s) Performed: COLONOSCOPY POLYPECTOMY, INTESTINE  Patient location during evaluation: PACU Anesthesia Type: General Level of consciousness: awake and alert Pain management: pain level controlled Vital Signs Assessment: post-procedure vital signs reviewed and stable Respiratory status: spontaneous breathing, nonlabored ventilation, respiratory function stable and patient connected to nasal cannula oxygen Cardiovascular status: blood pressure returned to baseline and stable Postop Assessment: no apparent nausea or vomiting Anesthetic complications: no   No notable events documented.   Last Vitals:  Vitals:   11/23/24 0957 11/23/24 1001  BP: (!) 91/48 (!) 96/48  Pulse: 77   Resp: 14   Temp: 36.7 C   SpO2: 96%     Last Pain:  Vitals:   11/23/24 0957  TempSrc: Axillary  PainSc: 0-No pain                 Andrea Limes

## 2024-11-23 NOTE — H&P (Signed)
 Primary Care Physician:  Shona Norleen PEDLAR, MD Primary Gastroenterologist:  Dr. Cinderella  Pre-Procedure History & Physical: HPI:  [name] is a 68 y.o. female is here for a colonoscopy for colon polyp surveillance  .  Patient denies any family history of colorectal cancer.  No melena or hematochezia.  No abdominal pain or unintentional weight loss.  No change in bowel habits.  Overall feels well from a GI standpoint.   2018  - Small lipoma at the hepatic flexure. - Diverticulosis in the sigmoid colon and in the ascending colon. - One 4 mm polyp in the sigmoid colon, removed with a cold snare. Resected and retrieved. - External hemorrhoids. Patient had single polyp removed and it is tubular adenoma.  Next colonoscopy in 7 years.   Past Medical History:  Diagnosis Date   Anxiety    Arthritis    Asthma    Chronic knee pain    Diverticulitis    Dyspnea    with exertion   GERD (gastroesophageal reflux disease)    Headache    HSV infection    Hypertension    Low back pain 12/2023   Pneumonia    x several   Pre-diabetes    no meds, diet controlled   Seasonal allergies    Vertigo     Past Surgical History:  Procedure Laterality Date   ABDOMINAL HYSTERECTOMY     ABLATION COLPOCLESIS     CHOLECYSTECTOMY     COLONOSCOPY WITH PROPOFOL  N/A 10/04/2017   Procedure: COLONOSCOPY WITH PROPOFOL ;  Surgeon: Golda Claudis PENNER, MD;  Location: [facility];  Service: Endoscopy;  Laterality: N/A;  1:15   KNEE ARTHROSCOPY WITH LATERAL MENISECTOMY Right 04/29/2021   Procedure: RIGHT KNEE ARTHROSCOPY WITH LATERAL MENISCECTOMY;  Surgeon: Margrette Taft BRAVO, MD;  Location: [facility];  Service: Orthopedics;  Laterality: Right;   POLYPECTOMY  10/04/2017   Procedure: POLYPECTOMY;  Surgeon: Golda Claudis PENNER, MD;  Location: [facility];  Service: Endoscopy;;  sigmoid colon polyp   TOOTH EXTRACTION N/A 01/21/2024   Procedure: DENTAL RESTORATION/EXTRACTIONS NUMBER 6, 7, 8, 9, 10, 11, 32, AND  ALVEOLOPLASTY;  Surgeon: Sheryle Hamilton, DMD;  Location: [facility];  Service: Oral Surgery;  Laterality: N/A;    Prior to Admission medications   Medication Sig Start Date End Date Taking? Authorizing Provider  ALPRAZolam  (XANAX ) 1 MG tablet Take 2 mg by mouth at bedtime.   Yes [provider]  beclomethasone (QVAR) 40 MCG/ACT inhaler Inhale into the lungs 2 (two) times daily.   Yes [provider]  ciclopirox (PENLAC) 8 % solution Apply topically at bedtime. Apply over nail and surrounding skin. Apply daily over previous coat. After seven (7) days, may remove with alcohol and continue cycle.   Yes [provider]  cinacalcet  (SENSIPAR ) 30 MG tablet Take 1 tablet (30 mg total) by mouth daily with breakfast. 11/09/24  Yes Nida, Gebreselassie W, MD  fluorometholone (FML) 0.1 % ophthalmic suspension Place 1 drop into both eyes at bedtime. 08/07/19  Yes [provider]  fluticasone (FLOVENT HFA) 110 MCG/ACT inhaler Inhale 2 puffs into the lungs daily as needed (asthma).   Yes [provider]  gabapentin (NEURONTIN) 100 MG capsule Take 100 mg by mouth 3 (three) times daily.   Yes [provider]  HYDROcodone -acetaminophen  (NORCO/VICODIN) 5-325 MG tablet Take 1 tablet by mouth every 4 (four) hours as needed. 05/03/24  Yes Dean Clarity, MD  ibuprofen  (ADVIL ) 800 MG tablet Take 800 mg by mouth  every 8 (eight) hours as needed.   Yes [provider]  Oxycodone  HCl 10 MG TABS Take 10 mg by mouth every 8 (eight) hours as needed (severe pain).   Yes [provider]  triamterene -hydrochlorothiazide (MAXZIDE) 75-50 MG tablet Take 1 tablet by mouth daily.   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   Benadryl  [diphenhydramine ] Rash 02/06/2016   Latex Other (See Comments) 04/12/2012   Sulfa  antibiotics Rash and Other (See Comments) 04/10/2012    Family History  Problem Relation Age of Onset   Stroke Father    Hypertension Father    Emphysema Mother    Stroke Brother    Hypertension Brother    Diabetes Sister    Hypertension Sister     Social History   Socioeconomic History   Marital status: Widowed    Spouse name: Not on file   Number of children: Not on file   Years of education: Not on file   Highest education level: Not on file  Occupational History   Occupation: Production line  Tobacco Use   Smoking status: Never   Smokeless tobacco: Never  Vaping Use   Vaping status: Never Used  Substance and Sexual Activity   Alcohol use: No   Drug use: No   Sexual activity: Not Currently    Birth control/protection: Surgical    Comment: Hysterectomy  Other Topics Concern   Not on file  Social History Narrative   Not on file   Social Drivers of Health   Financial Resource Strain: Low Risk  (11/16/2023)   Overall Financial Resource Strain (CARDIA)    Difficulty of Paying Living Expenses: Not very hard  Food Insecurity: No Food Insecurity (11/16/2023)   Hunger Vital Sign    Worried About Running Out of Food in the Last Year: Never true    Ran Out of Food in the Last Year: Never true  Transportation Needs: No Transportation Needs (11/16/2023)   PRAPARE - Administrator, Civil Service (Medical): No    Lack of Transportation (Non-Medical): No  Physical Activity: Sufficiently Active (11/16/2023)   Exercise Vital Sign    Days of Exercise per Week: 7 days    Minutes of Exercise per Session: 60 min  Stress: No Stress Concern Present (11/16/2023)   [employer] of Occupational Health - Occupational Stress Questionnaire    Feeling of Stress : Not at all  Social Connections: Moderately Isolated (11/16/2023)   Social Connection and Isolation Panel    Frequency of Communication with Friends and Family: More than three times a week    Frequency of  Social Gatherings with Friends and Family: Three times a week    Attends Religious Services: More than 4 times per year    Active Member of Clubs or Organizations: No    Attends Banker Meetings: Never    Marital Status: Widowed  Intimate Partner Violence: Not At Risk (11/16/2023)   Humiliation, Afraid, Rape, and Kick questionnaire    Fear of Current or Ex-Partner: No    Emotionally Abused: No    Physically Abused: No    Sexually Abused: No    Review of Systems: See HPI, otherwise negative ROS  Physical Exam: Vital signs in last 24 hours: Temp:  [98.3 F (36.8 C)] 98.3 F (36.8 C) (12/04 0724) Pulse Rate:  [82] 82 (12/04  9275) Resp:  [12] 12 (12/04 0724) BP: (138)/(77) 138/77 (12/04 0724) SpO2:  [100 %] 100 % (12/04 0724) Weight:  [90.2 kg] 90.2 kg (12/04 0724)   General:   Alert,  Well-developed, well-nourished, pleasant and cooperative in NAD Head:  Normocephalic and atraumatic. Eyes:  Sclera clear, no icterus.   Conjunctiva pink. Ears:  Normal auditory acuity. Nose:  No deformity, discharge,  or lesions. Msk:  Symmetrical without gross deformities. Normal posture. Extremities:  Without clubbing or edema. Neurologic:  Alert and  oriented x4;  grossly normal neurologically. Skin:  Intact without significant lesions or rashes. Psych:  Alert and cooperative. Normal mood and affect.  Impression/Plan:  [name] is a 68 y.o. female is here for a colonoscopy for colon polyp surveillance   Proceed with colonoscopy   The risks of the procedure including infection, bleed, or perforation as well as benefits, limitations, alternatives and imponderables have been reviewed with the patient. Questions have been answered. All parties agreeable.

## 2024-11-23 NOTE — Discharge Instructions (Signed)

## 2024-11-24 ENCOUNTER — Encounter (HOSPITAL_COMMUNITY): Payer: Self-pay | Admitting: Gastroenterology

## 2024-11-24 LAB — SURGICAL PATHOLOGY

## 2024-11-28 ENCOUNTER — Encounter (INDEPENDENT_AMBULATORY_CARE_PROVIDER_SITE_OTHER): Payer: Self-pay | Admitting: *Deleted

## 2024-11-28 ENCOUNTER — Ambulatory Visit (INDEPENDENT_AMBULATORY_CARE_PROVIDER_SITE_OTHER): Payer: Self-pay | Admitting: Gastroenterology

## 2024-11-30 NOTE — Progress Notes (Signed)
 5 yr TCS noted in recall Patient result letter mailed procedure note and pathology result faxed to PCP

## 2024-12-25 DIAGNOSIS — E236 Other disorders of pituitary gland: Secondary | ICD-10-CM

## 2024-12-30 ENCOUNTER — Ambulatory Visit (HOSPITAL_COMMUNITY)
Admission: RE | Admit: 2024-12-30 | Discharge: 2024-12-30 | Disposition: A | Source: Ambulatory Visit | Attending: Nurse Practitioner | Admitting: Nurse Practitioner

## 2024-12-30 DIAGNOSIS — D352 Benign neoplasm of pituitary gland: Secondary | ICD-10-CM | POA: Diagnosis not present

## 2024-12-30 DIAGNOSIS — E236 Other disorders of pituitary gland: Secondary | ICD-10-CM

## 2025-03-09 ENCOUNTER — Ambulatory Visit: Admitting: "Endocrinology
